# Patient Record
Sex: Male | Born: 1950
Health system: Southern US, Community
[De-identification: ages and names within clinical notes are randomized; demographics above are authoritative.]

## PROBLEM LIST (undated history)

## (undated) DIAGNOSIS — E785 Hyperlipidemia, unspecified: Secondary | ICD-10-CM

## (undated) DIAGNOSIS — M2550 Pain in unspecified joint: Secondary | ICD-10-CM

## (undated) DIAGNOSIS — M1990 Unspecified osteoarthritis, unspecified site: Secondary | ICD-10-CM

## (undated) DIAGNOSIS — J40 Bronchitis, not specified as acute or chronic: Secondary | ICD-10-CM

## (undated) DIAGNOSIS — M549 Dorsalgia, unspecified: Secondary | ICD-10-CM

## (undated) DIAGNOSIS — Z860100 Personal history of colon polyps, unspecified: Secondary | ICD-10-CM

## (undated) DIAGNOSIS — G8929 Other chronic pain: Secondary | ICD-10-CM

## (undated) DIAGNOSIS — G4730 Sleep apnea, unspecified: Secondary | ICD-10-CM

## (undated) DIAGNOSIS — I10 Essential (primary) hypertension: Secondary | ICD-10-CM

## (undated) DIAGNOSIS — N189 Chronic kidney disease, unspecified: Secondary | ICD-10-CM

## (undated) HISTORY — PX: OTHER SURGICAL HISTORY: SHX169

## (undated) HISTORY — PX: TRANSURETHRAL RESECTION OF PROSTATE: SHX73

## (undated) HISTORY — PX: SHOULDER ARTHROSCOPY: SHX128

## (undated) HISTORY — PX: TONSILLECTOMY: SUR1361

## (undated) SURGERY — Surgical Case
Anesthesia: *Unknown

---

## 2010-09-27 ENCOUNTER — Other Ambulatory Visit: Payer: Self-pay | Admitting: Internal Medicine

## 2010-09-27 ENCOUNTER — Ambulatory Visit
Admission: RE | Admit: 2010-09-27 | Discharge: 2010-09-27 | Disposition: A | Discharge status: Home / Self Care | Payer: Managed Care, Other (non HMO) | Source: Ambulatory Visit | Attending: Internal Medicine | Admitting: Internal Medicine

## 2010-09-27 DIAGNOSIS — M79669 Pain in unspecified lower leg: Secondary | ICD-10-CM

## 2011-02-25 ENCOUNTER — Encounter (HOSPITAL_BASED_OUTPATIENT_CLINIC_OR_DEPARTMENT_OTHER)
Admission: RE | Admit: 2011-02-25 | Discharge: 2011-02-25 | Disposition: A | Discharge status: Home / Self Care | Payer: Managed Care, Other (non HMO) | Source: Ambulatory Visit | Attending: Orthopedic Surgery | Admitting: Orthopedic Surgery

## 2011-02-25 ENCOUNTER — Other Ambulatory Visit: Payer: Self-pay

## 2011-02-25 ENCOUNTER — Encounter (HOSPITAL_BASED_OUTPATIENT_CLINIC_OR_DEPARTMENT_OTHER): Payer: Self-pay | Admitting: *Deleted

## 2011-02-25 LAB — BASIC METABOLIC PANEL
CO2: 33 mEq/L — ABNORMAL HIGH (ref 19–32)
Calcium: 9.9 mg/dL (ref 8.4–10.5)
Chloride: 102 mEq/L (ref 96–112)
Creatinine, Ser: 0.84 mg/dL (ref 0.50–1.35)
GFR calc Af Amer: >90 mL/min (ref >90)
Sodium: 143 mEq/L (ref 135–145)

## 2011-02-25 NOTE — Progress Notes (Signed)
Bring all meds-arrive with daughter

## 2011-02-26 ENCOUNTER — Encounter (HOSPITAL_BASED_OUTPATIENT_CLINIC_OR_DEPARTMENT_OTHER): Payer: Self-pay | Admitting: Certified Registered Nurse Anesthetist

## 2011-02-26 ENCOUNTER — Encounter (HOSPITAL_BASED_OUTPATIENT_CLINIC_OR_DEPARTMENT_OTHER): Payer: Self-pay | Admitting: *Deleted

## 2011-02-26 ENCOUNTER — Ambulatory Visit (HOSPITAL_BASED_OUTPATIENT_CLINIC_OR_DEPARTMENT_OTHER)
Admission: RE | Admit: 2011-02-26 | Discharge: 2011-02-26 | Disposition: A | Discharge status: Home / Self Care | Payer: Managed Care, Other (non HMO) | Source: Ambulatory Visit | Attending: Orthopedic Surgery | Admitting: Orthopedic Surgery

## 2011-02-26 ENCOUNTER — Encounter (HOSPITAL_BASED_OUTPATIENT_CLINIC_OR_DEPARTMENT_OTHER)
Admission: RE | Disposition: A | Discharge status: Home / Self Care | Payer: Self-pay | Source: Ambulatory Visit | Attending: Orthopedic Surgery

## 2011-02-26 ENCOUNTER — Ambulatory Visit (HOSPITAL_COMMUNITY): Payer: Managed Care, Other (non HMO)

## 2011-02-26 DIAGNOSIS — Z5309 Procedure and treatment not carried out because of other contraindication: Secondary | ICD-10-CM | POA: Insufficient documentation

## 2011-02-26 DIAGNOSIS — M25819 Other specified joint disorders, unspecified shoulder: Secondary | ICD-10-CM | POA: Insufficient documentation

## 2011-02-26 DIAGNOSIS — Z01818 Encounter for other preprocedural examination: Secondary | ICD-10-CM | POA: Insufficient documentation

## 2011-02-26 DIAGNOSIS — I10 Essential (primary) hypertension: Secondary | ICD-10-CM | POA: Insufficient documentation

## 2011-02-26 DIAGNOSIS — Z01810 Encounter for preprocedural cardiovascular examination: Secondary | ICD-10-CM | POA: Insufficient documentation

## 2011-02-26 DIAGNOSIS — Z01812 Encounter for preprocedural laboratory examination: Secondary | ICD-10-CM | POA: Insufficient documentation

## 2011-02-26 DIAGNOSIS — R7981 Abnormal blood-gas level: Secondary | ICD-10-CM | POA: Insufficient documentation

## 2011-02-26 HISTORY — DX: Unspecified osteoarthritis, unspecified site: M19.90

## 2011-02-26 HISTORY — DX: Essential (primary) hypertension: I10

## 2011-02-26 LAB — GLUCOSE, CAPILLARY: Glucose-Capillary: 168 mg/dL — ABNORMAL HIGH (ref 70–99)

## 2011-02-26 LAB — POCT HEMOGLOBIN-HEMACUE: Hemoglobin: 14.5 g/dL (ref 13.0–17.0)

## 2011-02-26 SURGERY — SHOULDER ARTHROSCOPY WITH SUBACROMIAL DECOMPRESSION
Anesthesia: General | Laterality: Right | Wound class: Clean

## 2011-02-26 MED ORDER — LACTATED RINGERS IV SOLN
INTRAVENOUS | Status: DC
  Administered 2011-02-26: 08:00:00 via INTRAVENOUS

## 2011-02-26 NOTE — Progress Notes (Signed)
Pt. Cancelled by Dr Ivin Booty and Dr. Luiz Blare due to recent bronchitis. Room air sat 90 , 91.  Pt will follow up with primary care M.D.  Dr. Luiz Blare spoke with pt. And will reschedule when this issue resolved.  Iv d/c tip intact .Pt. D/c with daughter to home

## 2011-02-26 NOTE — Anesthesia Preprocedure Evaluation (Addendum)
Anesthesia Evaluation    Airway       Dental   Pulmonary shortness of breath and at rest, sleep apnea (Never tested but is a very likely candidate.) ,  Pt. Presented today with Sa02 of 90% on RA. Not SOB. CXR showed no acute process but only with concerted effort was he able to increase to 95%.  I felt that he was not safe to block or have an anesthetic and then try to recover at [facility] without possible intervention to support his breathing, Dr. Luiz Blare and I agreed he should not have surgery today in this setting.  This may be his best although he remains 1 month S/P bronchitis with 02 at home over that weekend. Dr. Valentina Lucks at [facility] is aware per Dr. Luiz Blare.         Cardiovascular hypertension,     Neuro/Psych    GI/Hepatic   Endo/Other  Diabetes mellitus-  Renal/GU      Musculoskeletal   Abdominal   Peds  Hematology   Anesthesia Other Findings   Reproductive/Obstetrics                          Anesthesia Physical Anesthesia Plan Anesthesia Quick Evaluation

## 2011-02-26 NOTE — H&P (Signed)
  PREOPERATIVE H&P  Chief Complaint: 27 yomale w/ cont shoulder pain  HPI: Joseph Clay is a 60 y.o. male who presents for evaluation of R.. Shoulder pain. It has been present for several months and has been worsening. He has failed conservative measures. Pain is rated as moderate.  Past Medical History  Diagnosis Date  . Hypertension   . Arthritis   . Diabetes mellitus    Past Surgical History  Procedure Date  . Tonsillectomy    History   Social History  . Marital Status: Single    Spouse Name: N/A    Number of Children: N/A  . Years of Education: N/A   Social History Main Topics  . Smoking status: Former Games developer  . Smokeless tobacco: None  . Alcohol Use: Yes  . Drug Use: No  . Sexually Active:    Other Topics Concern  . None   Social History Narrative  . None   History reviewed. No pertinent family history. No Known Allergies Prior to Admission medications   Medication Sig Start Date End Date Taking? Authorizing Provider  amLODipine (NORVASC) 5 MG tablet Take 5 mg by mouth daily.     Yes Historical Provider, MD  aspirin 81 MG chewable tablet Chew 81 mg by mouth daily.     Yes Historical Provider, MD  calcium carbonate (OS-CAL) 600 MG TABS Take 600 mg by mouth 2 (two) times daily with a meal.     Yes Historical Provider, MD  lisinopril (PRINIVIL,ZESTRIL) 20 MG tablet Take 20 mg by mouth daily.     Yes Historical Provider, MD  metFORMIN (GLUCOPHAGE) 500 MG tablet Take 500 mg by mouth 2 (two) times daily with a meal.     Yes Historical Provider, MD  pravastatin (PRAVACHOL) 40 MG tablet Take 40 mg by mouth every evening.     Yes Historical Provider, MD     Positive ROS: none as relates to hpi  All other systems have been reviewed and were otherwise negative with the exception of those mentioned in the HPI and as above.  Physical Exam: Filed Vitals:   02/26/11 0708  BP: 177/91  Pulse: 98  Temp: 97.7 F (36.5 C)  Resp: 20    General: Alert, no acute  distress Cardiovascular: No pedal edema Respiratory: No cyanosis, no use of accessory musculature GI: No organomegaly, abdomen is soft and non-tender Skin: No lesions in the area of chief complaint Neurologic: Sensation intact distally Psychiatric: Patient is competent for consent with normal mood and affect Lymphatic: No axillary or cervical lymphadenopathy  MUSCULOSKELETAL: r. Shoulder +impingement/+ac joint pain to palp/ weakness in ER  Assessment/Plan: impingement Plan for Procedure(s): SHOULDER ARTHROSCOPY WITH SUBACROMIAL DECOMPRESSION  The risks benefits and alternatives were discussed with the patient including but not limited to the risks of nonoperative treatment, versus surgical intervention including infection, bleeding, nerve injury, malunion, nonunion, hardware prominence, hardware failure, need for hardware removal, blood clots, cardiopulmonary complications, morbidity, mortality, among others, and they were willing to proceed.  Predicted outcome is good, although there will be at least a six to nine month expected recovery.  Jasher Barkan L 02/26/2011 8:15 AM

## 2011-02-27 ENCOUNTER — Ambulatory Visit (HOSPITAL_COMMUNITY)
Admission: RE | Admit: 2011-02-27 | Discharge: 2011-02-27 | Disposition: A | Discharge status: Home / Self Care | Payer: Managed Care, Other (non HMO) | Source: Ambulatory Visit | Attending: Internal Medicine | Admitting: Internal Medicine

## 2011-02-27 ENCOUNTER — Other Ambulatory Visit (HOSPITAL_COMMUNITY): Payer: Self-pay | Admitting: Respiratory Therapy

## 2011-02-27 ENCOUNTER — Other Ambulatory Visit (HOSPITAL_COMMUNITY): Payer: Self-pay | Admitting: Internal Medicine

## 2011-02-27 DIAGNOSIS — R5381 Other malaise: Secondary | ICD-10-CM | POA: Insufficient documentation

## 2011-02-27 DIAGNOSIS — R5383 Other fatigue: Secondary | ICD-10-CM | POA: Insufficient documentation

## 2011-02-27 DIAGNOSIS — R0602 Shortness of breath: Secondary | ICD-10-CM

## 2011-02-27 LAB — BLOOD GAS, ARTERIAL
Acid-Base Excess: 6.1 mmol/L — ABNORMAL HIGH (ref 0.0–2.0)
Drawn by: 24610
Patient temperature: 98.6
TCO2: 32.2 mmol/L (ref 0–100)

## 2011-02-27 MED ORDER — ALBUTEROL SULFATE (5 MG/ML) 0.5% IN NEBU: 2.5000 mg | INHALATION_SOLUTION | Freq: Once | RESPIRATORY_TRACT | Status: DC

## 2011-02-27 MED ORDER — ALBUTEROL SULFATE (5 MG/ML) 0.5% IN NEBU
2.5000 mg | INHALATION_SOLUTION | Freq: Once | RESPIRATORY_TRACT | Status: AC
  Administered 2011-02-27: 2.5 mg via RESPIRATORY_TRACT

## 2011-03-05 ENCOUNTER — Encounter (HOSPITAL_COMMUNITY): Payer: Managed Care, Other (non HMO)

## 2011-05-09 ENCOUNTER — Encounter (HOSPITAL_COMMUNITY): Payer: Self-pay | Admitting: Pharmacy Technician

## 2011-05-15 ENCOUNTER — Other Ambulatory Visit: Payer: Self-pay | Admitting: Orthopedic Surgery

## 2011-05-15 ENCOUNTER — Other Ambulatory Visit (HOSPITAL_COMMUNITY): Payer: Self-pay | Admitting: Radiology

## 2011-05-15 ENCOUNTER — Ambulatory Visit (HOSPITAL_COMMUNITY)
Admission: RE | Admit: 2011-05-15 | Discharge: 2011-05-15 | Disposition: A | Discharge status: Home / Self Care | Payer: Managed Care, Other (non HMO) | Source: Ambulatory Visit | Attending: Internal Medicine | Admitting: Internal Medicine

## 2011-05-15 DIAGNOSIS — J4489 Other specified chronic obstructive pulmonary disease: Secondary | ICD-10-CM | POA: Insufficient documentation

## 2011-05-15 DIAGNOSIS — J449 Chronic obstructive pulmonary disease, unspecified: Secondary | ICD-10-CM | POA: Insufficient documentation

## 2011-05-15 LAB — BLOOD GAS, ARTERIAL
Acid-Base Excess: 4.2 mmol/L — ABNORMAL HIGH (ref 0.0–2.0)
Bicarbonate: 28.3 mEq/L — ABNORMAL HIGH (ref 20.0–24.0)
O2 Saturation: 94 %
Patient temperature: 98.6
TCO2: 29.6 mmol/L (ref 0–100)

## 2011-05-16 ENCOUNTER — Encounter (HOSPITAL_COMMUNITY): Payer: Self-pay

## 2011-05-16 ENCOUNTER — Encounter (HOSPITAL_COMMUNITY)
Admission: RE | Admit: 2011-05-16 | Discharge: 2011-05-16 | Disposition: A | Discharge status: Home / Self Care | Payer: Managed Care, Other (non HMO) | Source: Ambulatory Visit | Attending: Anesthesiology | Admitting: Anesthesiology

## 2011-05-16 ENCOUNTER — Encounter (HOSPITAL_COMMUNITY)
Admission: RE | Admit: 2011-05-16 | Discharge: 2011-05-16 | Disposition: A | Discharge status: Home / Self Care | Payer: Managed Care, Other (non HMO) | Source: Ambulatory Visit | Attending: Orthopedic Surgery | Admitting: Orthopedic Surgery

## 2011-05-16 HISTORY — DX: Personal history of colonic polyps: Z86.010

## 2011-05-16 HISTORY — DX: Personal history of colon polyps, unspecified: Z86.0100

## 2011-05-16 HISTORY — DX: Other chronic pain: G89.29

## 2011-05-16 HISTORY — DX: Dorsalgia, unspecified: M54.9

## 2011-05-16 HISTORY — DX: Bronchitis, not specified as acute or chronic: J40

## 2011-05-16 HISTORY — DX: Hyperlipidemia, unspecified: E78.5

## 2011-05-16 HISTORY — DX: Sleep apnea, unspecified: G47.30

## 2011-05-16 HISTORY — DX: Pain in unspecified joint: M25.50

## 2011-05-16 LAB — CBC
HCT: 43.9 % (ref 39.0–52.0)
Hemoglobin: 14.8 g/dL (ref 13.0–17.0)
MCV: 81.4 fL (ref 78.0–100.0)
RBC: 5.39 MIL/uL (ref 4.22–5.81)
RDW: 14.6 % (ref 11.5–15.5)
WBC: 7.3 10*3/uL (ref 4.0–10.5)

## 2011-05-16 LAB — TYPE AND SCREEN: Antibody Screen: NEGATIVE

## 2011-05-16 LAB — SURGICAL PCR SCREEN
MRSA, PCR: NEGATIVE
Staphylococcus aureus: NEGATIVE

## 2011-05-16 LAB — ABO/RH: ABO/RH(D): B POS

## 2011-05-16 NOTE — Progress Notes (Signed)
Pt has never had echo/stress test/heart cath

## 2011-05-16 NOTE — Pre-Procedure Instructions (Signed)
18 Joseph Clay  05/16/2011   Your procedure is scheduled on:  Fri, Feb 8 @ 11:45  Report to [facility] at 9:45 AM.  Call this number if you have problems the morning of surgery: [phone]   Remember:   Do not eat food:After Midnight.  May have clear liquids: up to 4 Hours before arrival.  Clear liquids include soda, tea, black coffee, apple or grape juice, broth.  Take these medicines the morning of surgery with A SIP OF WATER: Amlodipine   Do not wear jewelry, make-up or nail polish.  Do not wear lotions, powders, or perfumes. You may wear deodorant.  Do not shave 48 hours prior to surgery.  Do not bring valuables to the hospital.  Contacts, dentures or bridgework may not be worn into surgery.  Leave suitcase in the car. After surgery it may be brought to your room.  For patients admitted to the hospital, checkout time is 11:00 AM the day of discharge.   Patients discharged the day of surgery will not be allowed to drive home.  Name and phone number of your driver:   Special Instructions: CHG Shower Use Special Wash: 1/2 bottle night before surgery and 1/2 bottle morning of surgery.   Please read over the following fact sheets that you were given: Pain Booklet, Coughing and Deep Breathing, Blood Transfusion Information, MRSA Information and Surgical Site Infection Prevention

## 2011-05-16 NOTE — Progress Notes (Signed)
Requested labs that were done 05/15/11 from Dr.Griffin-a bmet,lipid panel,a1c,and u/a was done

## 2011-05-16 NOTE — Progress Notes (Signed)
Sleep study requested from Dr.Griffin

## 2011-05-16 NOTE — Progress Notes (Signed)
Pt doesn't have a cardiologist;medical MD manages HTN/hyperlipidemia(Dr.John Griffin-[facility])

## 2011-05-23 ENCOUNTER — Encounter (HOSPITAL_COMMUNITY): Payer: Self-pay | Admitting: Anesthesiology

## 2011-05-23 ENCOUNTER — Encounter (HOSPITAL_COMMUNITY)
Admission: RE | Disposition: A | Discharge status: Home / Self Care | Payer: Self-pay | Source: Ambulatory Visit | Attending: Orthopedic Surgery

## 2011-05-23 ENCOUNTER — Ambulatory Visit (HOSPITAL_COMMUNITY): Payer: Managed Care, Other (non HMO) | Admitting: Anesthesiology

## 2011-05-23 ENCOUNTER — Ambulatory Visit (HOSPITAL_COMMUNITY)
Admission: RE | Admit: 2011-05-23 | Discharge: 2011-05-24 | Disposition: A | Discharge status: Home / Self Care | Payer: Managed Care, Other (non HMO) | Source: Ambulatory Visit | Attending: Orthopedic Surgery | Admitting: Orthopedic Surgery

## 2011-05-23 ENCOUNTER — Encounter (HOSPITAL_COMMUNITY): Payer: Self-pay | Admitting: *Deleted

## 2011-05-23 DIAGNOSIS — M719 Bursopathy, unspecified: Secondary | ICD-10-CM | POA: Insufficient documentation

## 2011-05-23 DIAGNOSIS — G4730 Sleep apnea, unspecified: Secondary | ICD-10-CM | POA: Insufficient documentation

## 2011-05-23 DIAGNOSIS — M67919 Unspecified disorder of synovium and tendon, unspecified shoulder: Secondary | ICD-10-CM | POA: Insufficient documentation

## 2011-05-23 DIAGNOSIS — Z87891 Personal history of nicotine dependence: Secondary | ICD-10-CM | POA: Insufficient documentation

## 2011-05-23 DIAGNOSIS — I10 Essential (primary) hypertension: Secondary | ICD-10-CM | POA: Insufficient documentation

## 2011-05-23 DIAGNOSIS — M24119 Other articular cartilage disorders, unspecified shoulder: Secondary | ICD-10-CM | POA: Insufficient documentation

## 2011-05-23 DIAGNOSIS — Z01812 Encounter for preprocedural laboratory examination: Secondary | ICD-10-CM | POA: Insufficient documentation

## 2011-05-23 DIAGNOSIS — M7541 Impingement syndrome of right shoulder: Secondary | ICD-10-CM

## 2011-05-23 DIAGNOSIS — M19019 Primary osteoarthritis, unspecified shoulder: Secondary | ICD-10-CM | POA: Insufficient documentation

## 2011-05-23 DIAGNOSIS — E119 Type 2 diabetes mellitus without complications: Secondary | ICD-10-CM | POA: Insufficient documentation

## 2011-05-23 DIAGNOSIS — M25819 Other specified joint disorders, unspecified shoulder: Secondary | ICD-10-CM | POA: Insufficient documentation

## 2011-05-23 LAB — GLUCOSE, CAPILLARY
Glucose-Capillary: 134 mg/dL — ABNORMAL HIGH (ref 70–99)
Glucose-Capillary: 134 mg/dL — ABNORMAL HIGH (ref 70–99)

## 2011-05-23 SURGERY — ARTHROSCOPY, SHOULDER, WITH ROTATOR CUFF REPAIR
Anesthesia: Regional | Site: Shoulder | Laterality: Right | Wound class: Clean

## 2011-05-23 MED ORDER — OXYCODONE-ACETAMINOPHEN 5-325 MG PO TABS: 1.0000 | ORAL_TABLET | Freq: Four times a day (QID) | ORAL | Status: DC | PRN

## 2011-05-23 MED ORDER — HYDROMORPHONE HCL PF 1 MG/ML IJ SOLN: 0.5000 mg | INTRAMUSCULAR | Status: DC | PRN

## 2011-05-23 MED ORDER — PROPOFOL 10 MG/ML IV EMUL
INTRAVENOUS | Status: DC | PRN
  Administered 2011-05-23: 20 mg via INTRAVENOUS
  Administered 2011-05-23: 180 mg via INTRAVENOUS

## 2011-05-23 MED ORDER — ROCURONIUM BROMIDE 100 MG/10ML IV SOLN
INTRAVENOUS | Status: DC | PRN
  Administered 2011-05-23: 50 mg via INTRAVENOUS

## 2011-05-23 MED ORDER — POVIDONE-IODINE 7.5 % EX SOLN: Freq: Once | CUTANEOUS | Status: DC

## 2011-05-23 MED ORDER — NEOSTIGMINE METHYLSULFATE 1 MG/ML IJ SOLN
INTRAMUSCULAR | Status: DC | PRN
  Administered 2011-05-23: 5 mg via INTRAVENOUS

## 2011-05-23 MED ORDER — ONDANSETRON HCL 4 MG/2ML IJ SOLN
INTRAMUSCULAR | Status: DC | PRN
  Administered 2011-05-23: 4 mg via INTRAVENOUS

## 2011-05-23 MED ORDER — ONDANSETRON HCL 4 MG PO TABS: 4.0000 mg | ORAL_TABLET | Freq: Four times a day (QID) | ORAL | Status: DC | PRN

## 2011-05-23 MED ORDER — LACTATED RINGERS IV SOLN
INTRAVENOUS | Status: DC
  Administered 2011-05-23: 11:00:00 via INTRAVENOUS

## 2011-05-23 MED ORDER — LISINOPRIL 20 MG PO TABS
20.0000 mg | ORAL_TABLET | Freq: Every day | ORAL | Status: DC
Start: 2011-05-23 — End: 2011-05-24
  Administered 2011-05-24: 20 mg via ORAL
  Filled 2011-05-23 (×2): qty 1

## 2011-05-23 MED ORDER — SODIUM CHLORIDE 0.9 % IR SOLN
Status: DC | PRN
  Administered 2011-05-23: 3000 mL

## 2011-05-23 MED ORDER — SODIUM CHLORIDE 0.9 % IV SOLN
10.0000 mg | INTRAVENOUS | Status: DC | PRN
  Administered 2011-05-23: 50 ug/min via INTRAVENOUS

## 2011-05-23 MED ORDER — MIDAZOLAM HCL 2 MG/2ML IJ SOLN: 1.0000 mg | Freq: Once | INTRAMUSCULAR | Status: DC

## 2011-05-23 MED ORDER — ONDANSETRON HCL 4 MG/2ML IJ SOLN: 4.0000 mg | Freq: Four times a day (QID) | INTRAMUSCULAR | Status: DC | PRN

## 2011-05-23 MED ORDER — HYDROMORPHONE HCL PF 1 MG/ML IJ SOLN: 0.2500 mg | INTRAMUSCULAR | Status: DC | PRN

## 2011-05-23 MED ORDER — FENTANYL CITRATE 0.05 MG/ML IJ SOLN: 50.0000 ug | INTRAMUSCULAR | Status: DC | PRN

## 2011-05-23 MED ORDER — LACTATED RINGERS IV SOLN
INTRAVENOUS | Status: DC | PRN
  Administered 2011-05-23 (×2): via INTRAVENOUS

## 2011-05-23 MED ORDER — METFORMIN HCL 500 MG PO TABS
500.0000 mg | ORAL_TABLET | Freq: Two times a day (BID) | ORAL | Status: DC
  Administered 2011-05-24: 500 mg via ORAL
  Filled 2011-05-23 (×4): qty 1

## 2011-05-23 MED ORDER — BUPIVACAINE-EPINEPHRINE PF 0.5-1:200000 % IJ SOLN
INTRAMUSCULAR | Status: DC | PRN
  Administered 2011-05-23: 30 mL

## 2011-05-23 MED ORDER — GLYCOPYRROLATE 0.2 MG/ML IJ SOLN
INTRAMUSCULAR | Status: DC | PRN
  Administered 2011-05-23: .8 mg via INTRAVENOUS

## 2011-05-23 MED ORDER — FENTANYL CITRATE 0.05 MG/ML IJ SOLN
INTRAMUSCULAR | Status: DC | PRN
  Administered 2011-05-23 (×3): 50 ug via INTRAVENOUS

## 2011-05-23 MED ORDER — OXYCODONE-ACETAMINOPHEN 5-325 MG PO TABS
1.0000 | ORAL_TABLET | ORAL | Status: DC | PRN
  Administered 2011-05-24: 1 via ORAL
  Filled 2011-05-23: qty 1

## 2011-05-23 MED ORDER — AMLODIPINE BESYLATE 5 MG PO TABS
5.0000 mg | ORAL_TABLET | Freq: Every day | ORAL | Status: DC
  Administered 2011-05-24: 5 mg via ORAL
  Filled 2011-05-23: qty 1

## 2011-05-23 MED ORDER — CEFAZOLIN SODIUM 1-5 GM-% IV SOLN
INTRAVENOUS | Status: DC | PRN
  Administered 2011-05-23 (×2): 1 g via INTRAVENOUS

## 2011-05-23 MED ORDER — MIDAZOLAM HCL 5 MG/5ML IJ SOLN
INTRAMUSCULAR | Status: DC | PRN
  Administered 2011-05-23 (×2): 1 mg via INTRAVENOUS

## 2011-05-23 SURGICAL SUPPLY — 68 items
BENZOIN TINCTURE PRP APPL 2/3 (GAUZE/BANDAGES/DRESSINGS) IMPLANT
BLADE CUDA 4.2 (BLADE) ×2 IMPLANT
BLADE SURG 15 STRL LF DISP TIS (BLADE) IMPLANT
BLADE SURG 15 STRL SS (BLADE)
BUR VERTEX HOODED 4.5 (BURR) ×2 IMPLANT
CANISTER OMNI JUG 16 LITER (MISCELLANEOUS) IMPLANT
CANISTER SUCTION 2500CC (MISCELLANEOUS) IMPLANT
CANNULA 5.75X71 LONG (CANNULA) IMPLANT
CANNULA TWIST IN 8.25X7CM (CANNULA) ×2 IMPLANT
CLOTH BEACON ORANGE TIMEOUT ST (SAFETY) ×2 IMPLANT
DECANTER SPIKE VIAL GLASS SM (MISCELLANEOUS) IMPLANT
DRAPE INCISE IOBAN 66X45 STRL (DRAPES) ×2 IMPLANT
DRAPE STERI 35X30 U-POUCH (DRAPES) ×2 IMPLANT
DRAPE SURG 17X23 STRL (DRAPES) ×2 IMPLANT
DRAPE U-SHAPE 47X51 STRL (DRAPES) ×2 IMPLANT
DRAPE U-SHAPE 76X120 STRL (DRAPES) ×4 IMPLANT
DRSG EMULSION OIL 3X3 NADH (GAUZE/BANDAGES/DRESSINGS) ×2 IMPLANT
DRSG PAD ABDOMINAL 8X10 ST (GAUZE/BANDAGES/DRESSINGS) ×4 IMPLANT
DURAPREP 26ML APPLICATOR (WOUND CARE) ×2 IMPLANT
ELECT REM PT RETURN 9FT ADLT (ELECTROSURGICAL) ×2
ELECTRODE REM PT RTRN 9FT ADLT (ELECTROSURGICAL) ×1 IMPLANT
GLOVE BIOGEL PI IND STRL 8 (GLOVE) ×2 IMPLANT
GLOVE BIOGEL PI INDICATOR 8 (GLOVE) ×2
GLOVE ECLIPSE 7.5 STRL STRAW (GLOVE) ×4 IMPLANT
GOWN BRE IMP PREV XXLGXLNG (GOWN DISPOSABLE) ×2 IMPLANT
GOWN PREVENTION PLUS XLARGE (GOWN DISPOSABLE) ×2 IMPLANT
GOWN PREVENTION PLUS XXLARGE (GOWN DISPOSABLE) ×2 IMPLANT
KIT BASIN OR (CUSTOM PROCEDURE TRAY) ×2 IMPLANT
NDL SUT 6 .5 CRC .975X.05 MAYO (NEEDLE) IMPLANT
NEEDLE 1/2 CIR CATGUT .05X1.09 (NEEDLE) IMPLANT
NEEDLE HYPO 18GX1.5 BLUNT FILL (NEEDLE) ×2 IMPLANT
NEEDLE MAYO TAPER (NEEDLE)
NEEDLE SCORPION MULTI FIRE (NEEDLE) IMPLANT
NS IRRIG 1000ML POUR BTL (IV SOLUTION) IMPLANT
PACK ARTHROSCOPY DSU (CUSTOM PROCEDURE TRAY) ×2 IMPLANT
PASSER SUT SWANSON 36MM LOOP (INSTRUMENTS) IMPLANT
PENCIL BUTTON HOLSTER BLD 10FT (ELECTRODE) IMPLANT
SET IRRIG Y TYPE TUR BLADDER L (SET/KITS/TRAYS/PACK) ×2 IMPLANT
SLING ARM FOAM STRAP LRG (SOFTGOODS) IMPLANT
SLING ARM FOAM STRAP MED (SOFTGOODS) IMPLANT
SLING ARM FOAM STRAP XLG (SOFTGOODS) ×2 IMPLANT
SLING ARM IMMOBILIZER LRG (SOFTGOODS) IMPLANT
SPONGE GAUZE 4X4 12PLY (GAUZE/BANDAGES/DRESSINGS) ×2 IMPLANT
SPONGE LAP 4X18 X RAY DECT (DISPOSABLE) IMPLANT
STRIP CLOSURE SKIN 1/2X4 (GAUZE/BANDAGES/DRESSINGS) IMPLANT
SUCTION FRAZIER TIP 10 FR DISP (SUCTIONS) IMPLANT
SUT ETHIBOND 2 OS 4 DA (SUTURE) IMPLANT
SUT ETHILON 4 0 PS 2 18 (SUTURE) IMPLANT
SUT MNCRL AB 3-0 PS2 18 (SUTURE) IMPLANT
SUT PDS AB 0 CT 36 (SUTURE) IMPLANT
SUT PROLENE 3 0 PS 2 (SUTURE) IMPLANT
SUT TICRON 1 T 12 (SUTURE) IMPLANT
SUT TIGER TAPE 7 IN WHITE (SUTURE) IMPLANT
SUT VIC AB 0 CT1 27 (SUTURE)
SUT VIC AB 0 CT1 27XBRD ANBCTR (SUTURE) IMPLANT
SUT VIC AB 1 CT1 27 (SUTURE)
SUT VIC AB 1 CT1 27XBRD ANBCTR (SUTURE) IMPLANT
SUT VIC AB 2-0 SH 27 (SUTURE)
SUT VIC AB 2-0 SH 27XBRD (SUTURE) IMPLANT
SYR 5ML LL (SYRINGE) ×2 IMPLANT
TAPE CLOTH SURG 6X10 WHT LF (GAUZE/BANDAGES/DRESSINGS) ×2 IMPLANT
TAPE FIBER 2MM 7IN #2 BLUE (SUTURE) IMPLANT
TOWEL OR 17X24 6PK STRL BLUE (TOWEL DISPOSABLE) ×2 IMPLANT
TOWEL OR NON WOVEN STRL DISP B (DISPOSABLE) ×2 IMPLANT
TUBE CONNECTING 20X1/4 (TUBING) IMPLANT
WAND 90 DEG TURBOVAC W/CORD (SURGICAL WAND) ×2 IMPLANT
WATER STERILE IRR 1000ML POUR (IV SOLUTION) ×2 IMPLANT
YANKAUER SUCT BULB TIP NO VENT (SUCTIONS) IMPLANT

## 2011-05-23 NOTE — Anesthesia Preprocedure Evaluation (Addendum)
Anesthesia Evaluation  Patient identified by MRN, date of birth, ID band Patient awake    Reviewed: Allergy & Precautions, H&P , NPO status , Patient's Chart, lab work & pertinent test results, reviewed documented beta blocker date and time   Airway Mallampati: II TM Distance: >3 FB Neck ROM: full    Dental  (+) Dental Advisory Given, Teeth Intact and Caps   Pulmonary sleep apnea and Continuous Positive Airway Pressure Ventilation , former smoker         Cardiovascular hypertension, Pt. on medications     Neuro/Psych    GI/Hepatic   Endo/Other  Diabetes mellitus-, Well Controlled, Type 2, Oral Hypoglycemic AgentsMorbid obesity  Renal/GU      Musculoskeletal  (+) Arthritis -,   Abdominal   Peds  Hematology   Anesthesia Other Findings Caps front top  Reproductive/Obstetrics                         Anesthesia Physical Anesthesia Plan  ASA: III  Anesthesia Plan: General and Regional   Post-op Pain Management: MAC Combined w/ Regional for Post-op pain   Induction: Intravenous  Airway Management Planned: Oral ETT  Additional Equipment:   Intra-op Plan:   Post-operative Plan: Extubation in OR  Informed Consent: I have reviewed the patients History and Physical, chart, labs and discussed the procedure including the risks, benefits and alternatives for the proposed anesthesia with the patient or authorized representative who has indicated his/her understanding and acceptance.     Plan Discussed with: CRNA and Surgeon  Anesthesia Plan Comments:         Anesthesia Quick Evaluation

## 2011-05-23 NOTE — Anesthesia Postprocedure Evaluation (Signed)
Anesthesia Post Note  Patient: Joseph Clay  Procedure(s) Performed:  SHOULDER ARTHROSCOPY WITH ROTATOR CUFF REPAIR - RIGHT SHOULDER ARTHROSCOPY WITH SUBACROMIAL DECOMPRESSION AND DISTAL CLAVICLE EXCISION   Anesthesia type: General  Patient location: PACU  Post pain: Pain level controlled and Adequate analgesia  Post assessment: Post-op Vital signs reviewed, Patient's Cardiovascular Status Stable, Respiratory Function Stable, Patent Airway and Pain level controlled  Last Vitals:  Filed Vitals:   05/23/11 1615  BP:   Pulse:   Temp: 36.2 C  Resp:     Post vital signs: Reviewed and stable  Level of consciousness: awake, alert  and oriented  Complications: No apparent anesthesia complications

## 2011-05-23 NOTE — Anesthesia Procedure Notes (Addendum)
Anesthesia Regional Block:  Interscalene brachial plexus block  Pre-Anesthetic Checklist: ,, timeout performed, Correct Patient, Correct Site, Correct Laterality, Correct Procedure, Correct Position, site marked, Risks and benefits discussed,  Surgical consent,  Pre-op evaluation,  At surgeon's request and post-op pain management  Laterality: Right  Prep: chloraprep       Needles:  Injection technique: Single-shot  Needle Type: Echogenic Stimulator Needle     Needle Length: 5cm 5 cm Needle Gauge: 22 and 22 G    Additional Needles:  Procedures: ultrasound guided and nerve stimulator Interscalene brachial plexus block  Nerve Stimulator or Paresthesia:  Response: biceps flexion, 0.45 mA,   Additional Responses:   Narrative:  Start time: 05/23/2011 11:00 AM End time: 05/23/2011 11:13 AM Injection made incrementally with aspirations every 5 mL.  Performed by: Personally  Anesthesiologist: Dr Chaney Malling  Additional Notes: Functioning IV was confirmed and monitors were applied.  A 50mm 22ga Arrow echogenic stimulator needle was used. Sterile prep and drape,hand hygiene and sterile gloves were used.  Negative aspiration and negative test dose prior to incremental administration of local anesthetic. The patient tolerated the procedure well.  Ultrasound guidance: relevent anatomy identified, needle position confirmed, local anesthetic spread visualized around nerve(s), vascular puncture avoided.  Image printed for medical record.   Interscalene brachial plexus block Date/Time: 05/23/2011 1:22 PM Performed by: Darcey Nora    Procedure Name: Intubation Date/Time: 05/23/2011 12:14 PM Performed by: Darcey Nora Pre-anesthesia Checklist: Patient identified, Emergency Drugs available, Suction available and Patient being monitored Patient Re-evaluated:Patient Re-evaluated prior to inductionOxygen Delivery Method: Circle System Utilized Preoxygenation: Pre-oxygenation with 100%  oxygen Intubation Type: IV induction Ventilation: Oral airway inserted - appropriate to patient size and Mask ventilation without difficulty Laryngoscope Size: Mac, 3 and 4 Grade View: Grade III Tube type: Oral Tube size: 8.0 mm Number of attempts: 4 Airway Equipment and Method: stylet and video-laryngoscopy Placement Confirmation: ETT inserted through vocal cords under direct vision,  positive ETCO2 and breath sounds checked- equal and bilateral Secured at: 23 cm Tube secured with: Tape Dental Injury: Teeth and Oropharynx as per pre-operative assessment and Bloody posterior oropharynx  Difficulty Due To: Difficult Airway- due to reduced neck mobility Future Recommendations: Recommend- induction with short-acting agent, and alternative techniques readily available

## 2011-05-23 NOTE — H&P (Signed)
PREOPERATIVE H&P  Chief Complaint: r. Shoulder pain  HPI: Joseph Clay is a 61 y.o. male who presents for evaluation of r shoulder pain. It has been present for 6 mon and has been worsening. He has ultrasound documented rct.  He has failed conservative measures. Pain is rated as moderate.  Past Medical History  Diagnosis Date  . Arthritis   . Hyperlipidemia     takes Pravastatin daily  . Sleep apnea     CPAP-sleep study done 2months ago  . Bronchitis     couple of months ago  . Hypertension      takes Lisinopril and AMlodipine daily  . Joint pain     right shoulder and right knee  . Chronic back pain     bulding disc  . Hx of colonic polyps   . Diabetes mellitus     takes Metformin bid   Past Surgical History  Procedure Date  . Tonsillectomy     as a child  . Colonscopy    History   Social History  . Marital Status: Single    Spouse Name: N/A    Number of Children: N/A  . Years of Education: N/A   Social History Main Topics  . Smoking status: Former Games developer  . Smokeless tobacco: None   Comment: quit 20+yrs ago  . Alcohol Use: Yes     occasionally   . Drug Use: No  . Sexually Active: No   Other Topics Concern  . None   Social History Narrative  . None   Family History  Problem Relation Age of Onset  . Anesthesia problems Neg Hx   . Hypotension Neg Hx   . Malignant hyperthermia Neg Hx   . Pseudochol deficiency Neg Hx    No Known Allergies Prior to Admission medications   Medication Sig Start Date End Date Taking? Authorizing Provider  amLODipine (NORVASC) 5 MG tablet Take 5 mg by mouth daily.     Yes Historical Provider, MD  aspirin 81 MG chewable tablet Chew 81 mg by mouth daily.     Yes Historical Provider, MD  calcium carbonate (OS-CAL) 600 MG TABS Take 600 mg by mouth daily.    Yes Historical Provider, MD  lisinopril (PRINIVIL,ZESTRIL) 20 MG tablet Take 20 mg by mouth daily.     Yes Historical Provider, MD  metFORMIN (GLUCOPHAGE) 500 MG  tablet Take 500 mg by mouth 2 (two) times daily with a meal.     Yes Historical Provider, MD  pravastatin (PRAVACHOL) 40 MG tablet Take 40 mg by mouth every evening.     Yes Historical Provider, MD     Positive ROS: none  All other systems have been reviewed and were otherwise negative with the exception of those mentioned in the HPI and as above.  Physical Exam: Filed Vitals:   05/23/11 0935  BP: 155/93  Pulse: 89  Temp: 97.5 F (36.4 C)  Resp: 18    General: Alert, no acute distress Cardiovascular: No pedal edema Respiratory: No cyanosis, no use of accessory musculature GI: No organomegaly, abdomen is soft and non-tender Skin: No lesions in the area of chief complaint Neurologic: Sensation intact distally Psychiatric: Patient is competent for consent with normal mood and affect Lymphatic: No axillary or cervical lymphadenopathy  MUSCULOSKELETAL: impingement and ttp over AC jiont and pain on rom.  Mild weakness with ext. rotation  Assessment/Plan: IMPINGEMENT, AC JOINT PAIN Plan for Procedure(s): SHOULDER ARTHROSCOPY WITH ROTATOR CUFF REPAIR  The risks benefits  and alternatives were discussed with the patient including but not limited to the risks of nonoperative treatment, versus surgical intervention including infection, bleeding, nerve injury, malunion, nonunion, hardware prominence, hardware failure, need for hardware removal, blood clots, cardiopulmonary complications, morbidity, mortality, among others, and they were willing to proceed.  Predicted outcome is good, although there will be at least a six to nine month expected recovery.  Jonai Weyland L, MD 05/23/2011 11:37 AM

## 2011-05-23 NOTE — Transfer of Care (Signed)
Immediate Anesthesia Transfer of Care Note  Patient: Joseph Clay  Procedure(s) Performed:  SHOULDER ARTHROSCOPY WITH ROTATOR CUFF REPAIR - RIGHT SHOULDER ARTHROSCOPY WITH SUBACROMIAL DECOMPRESSION AND DISTAL CLAVICLE EXCISION   Patient Location: PACU  Anesthesia Type: General  Level of Consciousness: awake, alert  and patient cooperative  Airway & Oxygen Therapy: Patient Spontanous Breathing and Patient connected to face mask oxygen  Post-op Assessment: Report given to PACU RN, Post -op Vital signs reviewed and stable and Patient moving all extremities  Post vital signs: Reviewed and stable  Complications: No apparent anesthesia complications

## 2011-05-23 NOTE — Brief Op Note (Signed)
05/23/2011  2:46 PM  PATIENT:  [name]  61 y.o. male  PRE-OPERATIVE DIAGNOSIS:  IMPINGEMENT, AC JOINT ARTHRITIS RIGHT SHOULDER  POST-OPERATIVE DIAGNOSIS:  IMPINGEMENT, AC JOINT ARTHRITIS RIGHT SHOULDER  PROCEDURE:  Procedure(s):RIGHT SHOULDER ARTHROSCOPY WITH ACROMIOPLASTY AND DISTAL CLAVICLE RESECTION  SURGEON:  Surgeon(s): Harvie Junior, MD  PHYSICIAN ASSISTANT: Jacksyn Beeks PA_C    ANESTHESIA:   general  EBL:  Total I/O In: 1500 [I.V.:1500] Out: 50 [Blood:50]  BLOOD ADMINISTERED:none  DRAINS: none   LOCAL MEDICATIONS USED:  NONE  SPECIMEN:  No Specimen  DISPOSITION OF SPECIMEN:  N/A  COUNTS:  YES   DICTATION: .#[id number]  PLAN OF CARE: Discharge to home after PACU  PATIENT DISPOSITION:  PACU - hemodynamically stable.   Gus Puma PA-C

## 2011-05-23 NOTE — Preoperative (Signed)
Beta Blockers   Reason not to administer Beta Blockers:Not Applicable 

## 2011-05-24 NOTE — Progress Notes (Signed)
PATIENT ID: Joseph Clay  MRN: [id number]  DOB/AGE:  1950-10-04 / 61 y.o.  1 Day Post-Op Procedure(s) (LRB): SHOULDER ARTHROSCOPY WITH ROTATOR CUFF REPAIR (Right)  Subjective: Pain is mild.  No c/o chest pain or SOB.   Difficulty voiding, required I&O cath x 1.  Voided small amount over night.   Objective: Vital signs in last 24 hours: Temp:  [97.2 F (36.2 C)-99.3 F (37.4 C)] 99.3 F (37.4 C) (02/09 0250) Pulse Rate:  [77-105] 91  (02/09 0250) Resp:  [16-31] 18  (02/09 0250) BP: (138-179)/(73-104) 155/87 mmHg (02/09 0250) SpO2:  [89 %-100 %] 93 % (02/09 0250) Weight:  [138.574 kg (305 lb 8 oz)] 138.574 kg (305 lb 8 oz) (02/08 1640)  Intake/Output from previous day: 02/08 0701 - 02/09 0700 In: 1720 [P.O.:220; I.V.:1500] Out: 275 [Urine:225; Blood:50] Intake/Output this shift: Total I/O In: 120 [P.O.:120] Out: 125 [Urine:125]  No results found for this basename: HGB:5 in the last 72 hours No results found for this basename: WBC:2,RBC:2,HCT:2,PLT:2 in the last 72 hours No results found for this basename: NA:2,K:2,CL:2,CO2:2,BUN:2,CREATININE:2,GLUCOSE:2,CALCIUM:2 in the last 72 hours No results found for this basename: LABPT:2,INR:2 in the last 72 hours  Physical Exam: Neurovascular intact Sensation intact distally Intact pulses distally Incision: dressing C/D/I  Assessment/Plan: 1 Day Post-Op Procedure(s) (LRB): SHOULDER ARTHROSCOPY WITH ROTATOR CUFF REPAIR (Right)   Plan for discharge after voids, encouraged to get up and move around. Weight Bearing as Tolerated (WBAT) F/u dr. Luiz Blare 1wk   Mable Paris 05/24/2011, 5:31 AM

## 2011-05-24 NOTE — Op Note (Signed)
NAMEROBIN, PAFFORD NO.:  [id number]  MEDICAL RECORD NO.:  [id number]  LOCATION:  5013                         FACILITY:  [facility]  PHYSICIAN:  Harvie Junior, M.D.   DATE OF BIRTH:  [birth year]  DATE OF PROCEDURE:  05/23/2011 DATE OF DISCHARGE:                              OPERATIVE REPORT   PREOPERATIVE DIAGNOSIS:  Impingement, acromioclavicular joint arthritis, and rotator cuff tear.  POSTOPERATIVE DIAGNOSIS:  Impingement, acromioclavicular joint arthritis, and undersurface rotator cuff tearing with superior labral tearing, anterior to posterior.  PROCEDURES: 1. Arthroscopic subacromial decompression from the lateral and     posterior compartment. 2. Arthroscopic distal clavicle resection of an anterior compartment     over 20 mm. 3. Subtotal bursectomy. 4. Debridement of superior labral tear anterior to posterior within     the glenohumeral joint.  SURGEON:  Harvie Junior, MD  ASSISTANT:  Marshia Ly, PA  ANESTHESIA:  General.  BRIEF HISTORY:  Mr. Strege is a 61 year old male with a long history of having significant right shoulder pain.  He had been treated conservatively for a long period time.  Injection therapy had helped. Ultrasound imaging showed questionable rotator cuff tear, at least high- grade partial thickness.  He is brought to the operating room after failure of all conservative care for evaluation, debridement, and fixation as needed.  PROCEDURE IN DETAIL:  The patient was brought to the operating room. After adequate anesthesia was obtained with general anesthetic, the patient was placed supine on the operating table.  The right arm was prepped and draped in the usual sterile fashion.  Following this, routine arthroscopic examination of the shoulder revealed there was an obvious superior labral tear, anterior to posterior.  This was debrided with a suction shaver back to a smooth and stable rim.  Biceps tendon was evaluated  and found to be pushing at the rim but it seemed to be anchored well.  The rotator cuff was then evaluated on the surface and happily appeared to be intact.  There were some minimal degenerative changes in the cuff, but overall looked to be intact.  At this point, attention was turned out to the glenohumeral joint and the subacromial space.  An anterolateral acromioplasty was performed.  He had a dramatic anterior spur.  This was debrided, probably almost 2 cm from the very tip of the spur up into the acromion.  This gave a very nice space for the rotator cuff.  A subtotal bursectomy was performed.  At this time, no evidence of high-grade partial or full thickness rotator cuff tearing.  At this time, attention was turned to the distal clavicle, which was resected over 20 mm.  Once this was completed, the shoulder was copiously and thoroughly lavaged, and suction dried.  The arthroscopic portals were closed with bandage.  A sterile compressive dressing was applied.  The patient was taken to the recovery room and was noted to be in satisfactory condition.  Estimated blood loss for this procedure was none.     Harvie Junior, M.D.     Ranae Plumber  D:  05/23/2011  T:  05/24/2011  Job:  [id number]

## 2011-05-24 NOTE — Evaluation (Signed)
Physical Therapy Evaluation Patient Details Name: Joseph Clay MRN: [id number] DOB: 04/11/1951 Today's Date: 05/24/2011  Problem List: There is no problem list on file for this patient.   Past Medical History:  Past Medical History  Diagnosis Date  . Arthritis   . Hyperlipidemia     takes Pravastatin daily  . Sleep apnea     CPAP-sleep study done 2months ago  . Bronchitis     couple of months ago  . Hypertension      takes Lisinopril and AMlodipine daily  . Joint pain     right shoulder and right knee  . Chronic back pain     bulding disc  . Hx of colonic polyps   . Diabetes mellitus     takes Metformin bid   Past Surgical History:  Past Surgical History  Procedure Date  . Tonsillectomy     as a child  . Colonscopy     PT Assessment/Plan/Recommendation PT Assessment Clinical Impression Statement: Pt is a 61 y.o. male who underwent shoulder sx yesterday, 05-23-11.  His plan is to d/c home today.  No home equipment is needed.  Pt is I/mod I with all mobility.  Pt was educated on need for RUE sling when OOB.  No further PT needs. PT sign off. PT Recommendation/Assessment: Patent does not need any further PT services No Skilled PT: All education completed;Patient is modified independent with all activity/mobility PT Recommendation Follow Up Recommendations: No PT follow up Equipment Recommended: None recommended by PT PT Goals     PT Evaluation Precautions/Restrictions  Precautions Precautions: Shoulder Type of Shoulder Precautions: sling, gentle ROM Prior Functioning  Home Living Lives With: Alone Type of Home: Apartment Home Layout: Multi-level;Full bath on main level;Bed/bath upstairs Alternate Level Stairs-Rails: Right Home Access: Level entry Bathroom Toilet: Standard Home Adaptive Equipment: None Prior Function Level of Independence: Independent with basic ADLs;Independent with homemaking with ambulation;Independent with gait;Independent with  transfers Driving: Yes Cognition Cognition Arousal/Alertness: Awake/alert Overall Cognitive Status: Appears within functional limits for tasks assessed Orientation Level: Oriented X4 Sensation/Coordination   Extremity Assessment   Mobility (including Balance) Bed Mobility Bed Mobility: Yes Supine to Sit: 6: Modified independent (Device/Increase time) Sit to Supine: 6: Modified independent (Device/Increase time) Transfers Transfers: Yes Sit to Stand: 6: Modified independent (Device/Increase time) Stand to Sit: 6: Modified independent (Device/Increase time) Stand Pivot Transfers: 6: Modified independent (Device/Increase time) Ambulation/Gait Ambulation/Gait: Yes Ambulation/Gait Assistance: 7: Independent Ambulation Distance (Feet): 500 Feet Assistive device: None Gait Pattern: Within Functional Limits Gait velocity: WFL Stairs: Yes Stairs Assistance: 6: Modified independent (Device/Increase time) Stair Management Technique: One rail Right;Step to pattern Number of Stairs: 2  Height of Stairs: 6     Exercise    End of Session PT - End of Session Equipment Utilized During Treatment: Gait belt;Other (comment) (sling RUE) Activity Tolerance: Patient tolerated treatment well Patient left: in chair Nurse Communication: Mobility status for transfers;Mobility status for ambulation General Behavior During Session: [facility] for tasks performed Cognition: [facility] for tasks performed  Ilda Foil 05/24/2011, 10:03 AM  Aida Raider, PT  Office # [phone] Pager [phone]

## 2011-05-24 NOTE — Progress Notes (Signed)
Pt. Discharged 05/24/2011  11:16 AM Discharge instructions reviewed with patient/family. Patient/family verbalized understanding. All Rx's given. Questions answered as needed. Pt. Discharged to home with family/self. Taken off unit via W/C. [facility] Setters

## 2011-05-31 ENCOUNTER — Inpatient Hospital Stay (HOSPITAL_COMMUNITY)
Admission: EM | Admit: 2011-05-31 | Discharge: 2011-06-03 | DRG: 699 | Disposition: A | Discharge status: Home / Self Care | Payer: Managed Care, Other (non HMO) | Attending: Internal Medicine | Admitting: Internal Medicine

## 2011-05-31 ENCOUNTER — Encounter (HOSPITAL_COMMUNITY): Payer: Self-pay | Admitting: Emergency Medicine

## 2011-05-31 ENCOUNTER — Other Ambulatory Visit: Payer: Self-pay

## 2011-05-31 ENCOUNTER — Emergency Department (HOSPITAL_COMMUNITY): Payer: Managed Care, Other (non HMO)

## 2011-05-31 DIAGNOSIS — N320 Bladder-neck obstruction: Principal | ICD-10-CM | POA: Diagnosis present

## 2011-05-31 DIAGNOSIS — N19 Unspecified kidney failure: Secondary | ICD-10-CM

## 2011-05-31 DIAGNOSIS — N179 Acute kidney failure, unspecified: Secondary | ICD-10-CM | POA: Diagnosis present

## 2011-05-31 DIAGNOSIS — D72829 Elevated white blood cell count, unspecified: Secondary | ICD-10-CM | POA: Diagnosis present

## 2011-05-31 DIAGNOSIS — J4489 Other specified chronic obstructive pulmonary disease: Secondary | ICD-10-CM | POA: Diagnosis present

## 2011-05-31 DIAGNOSIS — I10 Essential (primary) hypertension: Secondary | ICD-10-CM | POA: Diagnosis present

## 2011-05-31 DIAGNOSIS — G4733 Obstructive sleep apnea (adult) (pediatric): Secondary | ICD-10-CM | POA: Diagnosis present

## 2011-05-31 DIAGNOSIS — J449 Chronic obstructive pulmonary disease, unspecified: Secondary | ICD-10-CM | POA: Diagnosis present

## 2011-05-31 DIAGNOSIS — E785 Hyperlipidemia, unspecified: Secondary | ICD-10-CM | POA: Diagnosis present

## 2011-05-31 DIAGNOSIS — Z66 Do not resuscitate: Secondary | ICD-10-CM | POA: Diagnosis present

## 2011-05-31 DIAGNOSIS — N139 Obstructive and reflux uropathy, unspecified: Secondary | ICD-10-CM | POA: Diagnosis present

## 2011-05-31 DIAGNOSIS — Z7982 Long term (current) use of aspirin: Secondary | ICD-10-CM

## 2011-05-31 DIAGNOSIS — Z87891 Personal history of nicotine dependence: Secondary | ICD-10-CM

## 2011-05-31 DIAGNOSIS — M549 Dorsalgia, unspecified: Secondary | ICD-10-CM | POA: Diagnosis present

## 2011-05-31 DIAGNOSIS — E119 Type 2 diabetes mellitus without complications: Secondary | ICD-10-CM | POA: Diagnosis present

## 2011-05-31 LAB — URINE MICROSCOPIC-ADD ON

## 2011-05-31 LAB — BASIC METABOLIC PANEL
BUN: 155 mg/dL — ABNORMAL HIGH (ref 6–23)
CO2: 18 mEq/L — ABNORMAL LOW (ref 19–32)
Chloride: 86 mEq/L — ABNORMAL LOW (ref 96–112)
Creatinine, Ser: 14.19 mg/dL — ABNORMAL HIGH (ref 0.50–1.35)
Glucose, Bld: 127 mg/dL — ABNORMAL HIGH (ref 70–99)

## 2011-05-31 LAB — URINALYSIS, ROUTINE W REFLEX MICROSCOPIC
Glucose, UA: NEGATIVE mg/dL
Nitrite: NEGATIVE
Specific Gravity, Urine: 1.011 (ref 1.005–1.030)
pH: 5.5 (ref 5.0–8.0)

## 2011-05-31 LAB — COMPREHENSIVE METABOLIC PANEL
Albumin: 2.7 g/dL — ABNORMAL LOW (ref 3.5–5.2)
BUN: 153 mg/dL — ABNORMAL HIGH (ref 6–23)
Calcium: 9 mg/dL (ref 8.4–10.5)
GFR calc Af Amer: 4 mL/min — ABNORMAL LOW (ref >90)
Glucose, Bld: 160 mg/dL — ABNORMAL HIGH (ref 70–99)
Sodium: 127 mEq/L — ABNORMAL LOW (ref 135–145)
Total Protein: 7.4 g/dL (ref 6.0–8.3)

## 2011-05-31 LAB — CBC
MCH: 27.4 pg (ref 26.0–34.0)
MCHC: 34.1 g/dL (ref 30.0–36.0)
MCV: 80.3 fL (ref 78.0–100.0)
Platelets: 329 10*3/uL (ref 150–400)
RBC: 5.29 MIL/uL (ref 4.22–5.81)

## 2011-05-31 LAB — PRO B NATRIURETIC PEPTIDE: Pro B Natriuretic peptide (BNP): 220 pg/mL — ABNORMAL HIGH (ref 0–125)

## 2011-05-31 LAB — DIFFERENTIAL
Basophils Relative: 0 % (ref 0–1)
Eosinophils Absolute: 0 10*3/uL (ref 0.0–0.7)
Eosinophils Relative: 0 % (ref 0–5)
Lymphs Abs: 1.8 10*3/uL (ref 0.7–4.0)
Monocytes Absolute: 1.8 10*3/uL — ABNORMAL HIGH (ref 0.1–1.0)

## 2011-05-31 MED ORDER — SODIUM CHLORIDE 0.9 % IV SOLN
INTRAVENOUS | Status: AC
  Administered 2011-05-31: 21:00:00 via INTRAVENOUS

## 2011-05-31 NOTE — H&P (Signed)
PCP:   [name], MD, MD   Chief Complaint:  Weakness and swelling  HPI: Patient is a 61 year old white male with past medical history of diabetes mellitus and hypertension and normal renal function was in usual state of health and by review of blood work noted normal renal function several weeks ago. In that time the patient had a left shoulder arthroscopy which was done without incident. For the past one week, the patient has been feeling very tired, increasing dyspnea on exertion, swelling, no appetite and according to his daughter slightly confused. Patient came in today for evaluation. In the emergency room, the patient was found to have a sodium of 1:30 and most concerning was a BUN of 155 and creatinine of 14. His bicarbonate level was down to 18 and he had a calculated anion gap of 26 consistent with uremia consistent with renal failure. Workup was started and the patient did report that he was making urine at home and had voided earlier today. He was not voiding emergency room, so the emergency room place a Foley catheter and immediately voided over 1-1/2 L of urine. It Is also noted the patient's white count was 22 and 84% shift possibly from prolonged pain and urine.  Review of Systems:  When I saw the patient in the emergency room, he was feeling tired. He denied headaches or vision changes or dysphagia. He denies any chest pain, palpitations, shortness of breath, wheeze, cough, abdominal pain, hematuria or dysuria, constipation diarrhea, focal extremity numbness weakness or pain. His major complaints were of fatigue, lack of appetite, feeling of generalized body swelling and feeling like he was in a haze. Review systems otherwise negative. He has stated that since they put a Foley catheter injection starting to feel better.  Past Medical History: Past Medical History  Diagnosis Date  . Arthritis   . Hyperlipidemia     takes Pravastatin daily  . Sleep apnea     CPAP-sleep  study done 2months ago  . Bronchitis     couple of months ago  . Hypertension      takes Lisinopril and AMlodipine daily  . Joint pain     right shoulder and right knee  . Chronic back pain     bulding disc  . Hx of colonic polyps   . Diabetes mellitus     takes Metformin bid   Past Surgical History  Procedure Date  . Tonsillectomy     as a child  . Colonscopy     Medications: Prior to Admission medications   Medication Sig Start Date End Date Taking? Authorizing Provider  amLODipine (NORVASC) 5 MG tablet Take 5 mg by mouth daily.     Yes Historical Provider, MD  aspirin 81 MG chewable tablet Chew 81 mg by mouth daily.     Yes Historical Provider, MD  calcium carbonate (OS-CAL) 600 MG TABS Take 600 mg by mouth daily.    Yes Historical Provider, MD  lisinopril (PRINIVIL,ZESTRIL) 20 MG tablet Take 20 mg by mouth daily.     Yes Historical Provider, MD  metFORMIN (GLUCOPHAGE) 500 MG tablet Take 500 mg by mouth 2 (two) times daily with a meal.     Yes Historical Provider, MD  oxyCODONE-acetaminophen (PERCOCET) 5-325 MG per tablet Take 1-2 tablets by mouth every 6 (six) hours as needed. 05/23/11 06/02/11 Yes Matthew Folks, PA  pravastatin (PRAVACHOL) 40 MG tablet Take 40 mg by mouth every evening.     Yes Historical Provider, MD  Allergies:   Allergies  Allergen Reactions  . Ester-C 600mg  Other (See Comments)    unknown    Social History:  reports that he has quit smoking. He does not have any smokeless tobacco history on file. He reports that he drinks alcohol. He reports that he does not use illicit drugs. The patient is normally at baseline able to participate in activities of daily living without difficulty. Patient was at home alone with daughter who lives in [place].  Family History: Family History  Problem Relation Age of Onset  . Anesthesia problems Neg Hx   . Hypotension Neg Hx   . Malignant hyperthermia Neg Hx   . Pseudochol deficiency Neg Hx     Physical  Exam: Filed Vitals:   05/31/11 2000 05/31/11 2030 05/31/11 2100 05/31/11 2130  BP: 127/56 126/56 127/58 115/48  Pulse: 88 88 88 87  Temp:      TempSrc:      Resp: 30     SpO2: 96% 95% 96% 96%   General: Alert and oriented x3, no acute distress, looks fatigued, looks older than stated age HEENT, [place] atraumatic, mucous membranes are dry Cardiovascular: Regular rate and rhythm, S1-S2 Lungs:" Bilaterally Abdomen: Soft, nontender, morbidly obese, positive bowel sounds Extremities: No clubbing or cyanosis, 1+ pitting edema from the mid calf down   Labs on Admission:   [facility] 05/31/11 2108 05/31/11 1826  NA 130* 127*  K 4.6 4.7  CL 86* 87*  CO2 18* 16*  GLUCOSE 127* 160*  BUN 155* 153*  CREATININE 14.19* 13.79*  CALCIUM 9.2 9.0  MG -- --  PHOS -- --    Basename 05/31/11 1826  AST 24  ALT 33  ALKPHOS 103  BILITOT 0.5  PROT 7.4  ALBUMIN 2.7*    Basename 05/31/11 1826  WBC 22.1*  NEUTROABS 18.5*  HGB 14.5  HCT 42.5  MCV 80.3  PLT 329    Radiological Exams on Admission:  Dg Chest Portable 1 View  05/31/2011   IMPRESSION: Cardiomegaly and low lung volumes.  Bibasilar airspace disease.  Although this could represent atelectasis and be secondary to low lung volumes/AP portable technique, infection and small pleural effusions.  cannot be excluded.  Consider further evaluation with PA and lateral radiographs.  Assessment/Plan Present on Admission:  .ARF (acute renal failure): Almost certainly entirely to bladder outlet obstruction. I spoken with nephrology who agrees with the plan for continuing Foley catheter plus hydration plus consult to urology. We'll also check a renal ultrasound to ensure that there are no secondary issues.  .Bladder outlet obstruction: Patient states he was evaluated by urology a year ago and told her that time he did not have any cancer issues. He does not say he does not recall that he has any prostate problems.  Marland KitchenHTN (hypertension):  Given his elevated BUN and intravascular volume depletion, holding his antihypertensives, especially his ACE inhibitor, diuretics and aspirin.  .DM type 2 (diabetes mellitus, type 2): Sliding scale for now until his appetite improves  After discussion with the patient, he is to be a DO NOT RESUSCITATE.  We will respect these wishes.  I anticipate his length of stay to be 2-3 days based on exam, history, lab work-dependent on how quickly his renal function returns to normal plus what urology decides to do.  Time spent on this patient including examination and decision-making process: 50 minutes.  Hollice Espy [phone] 05/31/2011, 10:26 PM

## 2011-05-31 NOTE — ED Provider Notes (Signed)
History     CSN: [id number]  Arrival date & time 05/31/11  1628   First MD Initiated Contact with Patient 05/31/11 1731      Chief Complaint  Patient presents with  . Abdominal Pain    (Consider location/radiation/quality/duration/timing/severity/associated sxs/prior treatment) Patient is a 61 y.o. male presenting with abdominal pain. The history is provided by the patient (Patient complains of weakness and also a full feeling in his belly. He recently had surgery 8 days ago to his left shoulder.). No language interpreter was used.  Abdominal Pain The primary symptoms of the illness include abdominal pain and vaginal discharge. The primary symptoms of the illness do not include fatigue or diarrhea. The current episode started 2 days ago. The onset of the illness was gradual. The problem has not changed since onset. The abdominal pain began 2 days ago. The pain came on gradually. The abdominal pain has been unchanged since its onset. The abdominal pain is generalized. The abdominal pain does not radiate. The severity of the abdominal pain is 1/10. The abdominal pain is relieved by nothing.  The patient states that she believes she is currently not pregnant. The patient has had a change in bowel habit. Symptoms associated with the illness do not include chills, hematuria, frequency or back pain. Significant associated medical issues do not include PUD.    Past Medical History  Diagnosis Date  . Arthritis   . Hyperlipidemia     takes Pravastatin daily  . Sleep apnea     CPAP-sleep study done 2months ago  . Bronchitis     couple of months ago  . Hypertension      takes Lisinopril and AMlodipine daily  . Joint pain     right shoulder and right knee  . Chronic back pain     bulding disc  . Hx of colonic polyps   . Diabetes mellitus     takes Metformin bid    Past Surgical History  Procedure Date  . Tonsillectomy     as a child  . Colonscopy     Family History  Problem  Relation Age of Onset  . Anesthesia problems Neg Hx   . Hypotension Neg Hx   . Malignant hyperthermia Neg Hx   . Pseudochol deficiency Neg Hx     History  Substance Use Topics  . Smoking status: Former Games developer  . Smokeless tobacco: Not on file   Comment: quit 20+yrs ago  . Alcohol Use: Yes     occasionally       Review of Systems  Constitutional: Negative for chills and fatigue.  HENT: Negative for congestion, sinus pressure and ear discharge.   Eyes: Negative for discharge.  Respiratory: Negative for cough.   Cardiovascular: Negative for chest pain.  Gastrointestinal: Positive for abdominal pain. Negative for diarrhea.  Genitourinary: Positive for vaginal discharge. Negative for frequency and hematuria.  Musculoskeletal: Negative for back pain.  Skin: Negative for rash.  Neurological: Negative for seizures and headaches.  Hematological: Negative.   Psychiatric/Behavioral: Negative for hallucinations.    Allergies  Ester-c 600mg   Home Medications   Current Outpatient Rx  Name Route Sig Dispense Refill  . AMLODIPINE BESYLATE 5 MG PO TABS Oral Take 5 mg by mouth daily.      . ASPIRIN 81 MG PO CHEW Oral Chew 81 mg by mouth daily.      Marland Kitchen CALCIUM CARBONATE 600 MG PO TABS Oral Take 600 mg by mouth daily.     Marland Kitchen  LISINOPRIL 20 MG PO TABS Oral Take 20 mg by mouth daily.      Marland Kitchen METFORMIN HCL 500 MG PO TABS Oral Take 500 mg by mouth 2 (two) times daily with a meal.      . OXYCODONE-ACETAMINOPHEN 5-325 MG PO TABS Oral Take 1-2 tablets by mouth every 6 (six) hours as needed.    Marland Kitchen PRAVASTATIN SODIUM 40 MG PO TABS Oral Take 40 mg by mouth every evening.        BP 126/56  Pulse 88  Temp(Src) 97.8 F (36.6 C) (Oral)  Resp 30  SpO2 95%  Physical Exam  Constitutional: He is oriented to person, place, and time. He appears well-developed.  HENT:  Head: Normocephalic and atraumatic.  Eyes: Conjunctivae and EOM are normal. No scleral icterus.  Neck: Neck supple. No thyromegaly  present.  Cardiovascular: Normal rate and regular rhythm.  Exam reveals no gallop and no friction rub.   No murmur heard. Pulmonary/Chest: No stridor. He has no wheezes. He has no rales. He exhibits no tenderness.  Abdominal: He exhibits no distension. There is no tenderness. There is no rebound.  Musculoskeletal: Normal range of motion. He exhibits edema.  Lymphadenopathy:    He has no cervical adenopathy.  Neurological: He is oriented to person, place, and time. Coordination normal.  Skin: No rash noted. No erythema.  Psychiatric: He has a normal mood and affect. His behavior is normal.    ED Course  Procedures (including critical care time)  Labs Reviewed  CBC - Abnormal; Notable for the following:    WBC 22.1 (*)    All other components within normal limits  DIFFERENTIAL - Abnormal; Notable for the following:    Neutrophils Relative 84 (*)    Lymphocytes Relative 8 (*)    Neutro Abs 18.5 (*)    Monocytes Absolute 1.8 (*)    All other components within normal limits  COMPREHENSIVE METABOLIC PANEL - Abnormal; Notable for the following:    Sodium 127 (*)    Chloride 87 (*)    CO2 16 (*)    Glucose, Bld 160 (*)    BUN 153 (*)    Creatinine, Ser 13.79 (*)    Albumin 2.7 (*)    GFR calc non Af Amer 3 (*)    GFR calc Af Amer 4 (*)    All other components within normal limits  PRO B NATRIURETIC PEPTIDE - Abnormal; Notable for the following:    Pro B Natriuretic peptide (BNP) 220.0 (*)    All other components within normal limits  URINALYSIS, ROUTINE W REFLEX MICROSCOPIC  URINE CULTURE  BASIC METABOLIC PANEL   Dg Chest Portable 1 View  05/31/2011  *RADIOLOGY REPORT*  Clinical Data: Shortness of breath and weakness for 2 days. History of bronchitis, hypertension, and diabetes.  Ex-smoker.  PORTABLE CHEST - 1 VIEW  Comparison: 05/16/2011  Findings: Mildly degraded exam due to AP portable technique and patient body habitus.  Patient rotated left. Cardiomegaly accentuated by AP  portable technique.  Cannot exclude small bilateral pleural effusions. No pneumothorax.  Low lung volumes with resultant pulmonary interstitial prominence.  Patchy left greater right bibasilar airspace disease.  IMPRESSION: Cardiomegaly and low lung volumes.  Bibasilar airspace disease.  Although this could represent atelectasis and be secondary to low lung volumes/AP portable technique, infection and small pleural effusions.  cannot be excluded.  Consider further evaluation with PA and lateral radiographs.  Original Report Authenticated By: Consuello Bossier, M.D.  No diagnosis found.    MDM  Renal failure. Patient had a Foley placed which put out 1400 cc. Renal failure possibly due to obstruction.        Benny Lennert, MD 05/31/11 2104

## 2011-05-31 NOTE — ED Notes (Signed)
MD at bedside. Hospitalist with patient 

## 2011-05-31 NOTE — ED Notes (Signed)
Pt had shoulder surg. And was discharged from hosp on 2/8.  St's has had problems with constipation but now abd is painful and distended.  Also c/o increased shortness of breath and weakness

## 2011-06-01 ENCOUNTER — Encounter (HOSPITAL_COMMUNITY): Payer: Self-pay | Admitting: *Deleted

## 2011-06-01 ENCOUNTER — Inpatient Hospital Stay (HOSPITAL_COMMUNITY): Payer: Managed Care, Other (non HMO)

## 2011-06-01 LAB — BASIC METABOLIC PANEL
BUN: 122 mg/dL — ABNORMAL HIGH (ref 6–23)
CO2: 22 mEq/L (ref 19–32)
GFR calc non Af Amer: 8 mL/min — ABNORMAL LOW (ref >90)
Glucose, Bld: 178 mg/dL — ABNORMAL HIGH (ref 70–99)
Potassium: 3.9 mEq/L (ref 3.5–5.1)
Sodium: 134 mEq/L — ABNORMAL LOW (ref 135–145)

## 2011-06-01 LAB — GLUCOSE, CAPILLARY: Glucose-Capillary: 178 mg/dL — ABNORMAL HIGH (ref 70–99)

## 2011-06-01 LAB — CBC
Hemoglobin: 12.6 g/dL — ABNORMAL LOW (ref 13.0–17.0)
MCH: 27.2 pg (ref 26.0–34.0)
MCHC: 34.5 g/dL (ref 30.0–36.0)
MCV: 78.7 fL (ref 78.0–100.0)
RBC: 4.64 MIL/uL (ref 4.22–5.81)

## 2011-06-01 MED ORDER — SODIUM CHLORIDE 0.9 % IV SOLN
INTRAVENOUS | Status: DC
  Administered 2011-06-02 (×2): via INTRAVENOUS

## 2011-06-01 MED ORDER — IOHEXOL 300 MG/ML  SOLN: 20.0000 mL | INTRAMUSCULAR | Status: AC

## 2011-06-01 MED ORDER — ONDANSETRON HCL 4 MG PO TABS: 4.0000 mg | ORAL_TABLET | Freq: Four times a day (QID) | ORAL | Status: DC | PRN

## 2011-06-01 MED ORDER — TAMSULOSIN HCL 0.4 MG PO CAPS
0.4000 mg | ORAL_CAPSULE | Freq: Every day | ORAL | Status: DC
  Administered 2011-06-01 – 2011-06-02 (×2): 0.4 mg via ORAL
  Filled 2011-06-01 (×4): qty 1

## 2011-06-01 MED ORDER — ACETAMINOPHEN 325 MG PO TABS: 650.0000 mg | ORAL_TABLET | Freq: Four times a day (QID) | ORAL | Status: DC | PRN

## 2011-06-01 MED ORDER — INSULIN ASPART 100 UNIT/ML ~~LOC~~ SOLN: 0.0000 [IU] | Freq: Every day | SUBCUTANEOUS | Status: DC

## 2011-06-01 MED ORDER — MORPHINE SULFATE 2 MG/ML IJ SOLN: 1.0000 mg | INTRAMUSCULAR | Status: DC | PRN

## 2011-06-01 MED ORDER — ONDANSETRON HCL 4 MG/2ML IJ SOLN: 4.0000 mg | Freq: Four times a day (QID) | INTRAMUSCULAR | Status: DC | PRN

## 2011-06-01 MED ORDER — ACETAMINOPHEN 650 MG RE SUPP: 650.0000 mg | Freq: Four times a day (QID) | RECTAL | Status: DC | PRN

## 2011-06-01 MED ORDER — INSULIN ASPART 100 UNIT/ML ~~LOC~~ SOLN
0.0000 [IU] | Freq: Three times a day (TID) | SUBCUTANEOUS | Status: DC
  Administered 2011-06-01 – 2011-06-02 (×2): 2 [IU] via SUBCUTANEOUS
  Administered 2011-06-02: 5 [IU] via SUBCUTANEOUS
  Administered 2011-06-02: 1 [IU] via SUBCUTANEOUS
  Filled 2011-06-01 (×2): qty 3

## 2011-06-01 NOTE — Consult Note (Signed)
Consult: Acute urinary retention, Acute renal failure Referring physician: Dr. Roseanne Reno  Patient is a 61 year old white male who underwent right shoulder surgery about 9 days ago. Post-op he required in and out cath due to urinary retention. He was d/c'd home the same day. He had trouble all week with bowel movements and urinating. He had a weak stream and intermittent flow. By yesterday, he was feeling very tired, increasing dyspnea on exertion, swelling, no appetite and according to his daughter slightly confused. In the emergency room, the patient was found to have a sodium of 130 and most concerning was a BUN of 155 and creatinine of 14. His bicarbonate level was down to 18 and he had a calculated anion gap of 26 consistent with uremia consistent with renal failure. A catheter was placed and drained over 2 liters. His Cr was 0.84 Nov 2012.  He continues to improve and is feeling much better today.   Prior to this he reports voiding with a good flow and no bothersome frequency or urgency. No dysuria or hematuria. No prostate meds.  He was seen by Dr. Laverle Patter in 2010 with a normal CT scan and negative NMP22. Pt declined cystoscopy.   Review of Systems:  Fatigue/tired. He denied headaches or vision changes or dysphagia. He denies any chest pain, palpitations, shortness of breath, wheeze, cough, abdominal pain, hematuria or dysuria, constipation diarrhea, focal extremity numbness weakness or pain. His major complaints were of fatigue, lack of appetite, feeling of generalized body swelling and feeling like he was in a haze. Review systems otherwise negative. He has stated that since they put a Foley catheter injection starting to feel better.  Past Medical History  Diagnosis Date  . Arthritis   . Hyperlipidemia     takes Pravastatin daily  . Sleep apnea     CPAP-sleep study done 2months ago  . Bronchitis     couple of months ago  . Hypertension      takes Lisinopril and AMlodipine daily  .  Joint pain     right shoulder and right knee  . Chronic back pain     bulding disc  . Hx of colonic polyps   . Diabetes mellitus     takes Metformin bid   Past Surgical History  Procedure Date  . Tonsillectomy     as a child  . Colonscopy   . Rt shoulder surgery     bone spur,scrape rotor cuff    Home Medications:  Prescriptions prior to admission  Medication Sig Dispense Refill  . amLODipine (NORVASC) 5 MG tablet Take 5 mg by mouth daily.        Marland Kitchen aspirin 81 MG chewable tablet Chew 81 mg by mouth daily.        . calcium carbonate (OS-CAL) 600 MG TABS Take 600 mg by mouth daily.       Marland Kitchen lisinopril (PRINIVIL,ZESTRIL) 20 MG tablet Take 20 mg by mouth daily.        . metFORMIN (GLUCOPHAGE) 500 MG tablet Take 500 mg by mouth 2 (two) times daily with a meal.        . oxyCODONE-acetaminophen (PERCOCET) 5-325 MG per tablet Take 1-2 tablets by mouth every 6 (six) hours as needed.      . pravastatin (PRAVACHOL) 40 MG tablet Take 40 mg by mouth every evening.         Allergies:  Allergies  Allergen Reactions  . Ester-C 600mg  Other (See Comments)    unknown  Family History  Problem Relation Age of Onset  . Anesthesia problems Neg Hx   . Hypotension Neg Hx   . Malignant hyperthermia Neg Hx   . Pseudochol deficiency Neg Hx    Social History:  reports that he has quit smoking. He does not have any smokeless tobacco history on file. He reports that he drinks alcohol. He reports that he does not use illicit drugs.    Physical Exam:  Vital signs in last 24 hours: Temp:  [97.1 F (36.2 C)-98.8 F (37.1 C)] 97.6 F (36.4 C) (02/17 1323) Pulse Rate:  [79-96] 96  (02/17 1323) Resp:  [18-36] 20  (02/17 1323) BP: (108-135)/(48-94) 135/78 mmHg (02/17 1323) SpO2:  [91 %-98 %] 93 % (02/17 1323) Weight:  [137.8 kg (303 lb 12.7 oz)] 137.8 kg (303 lb 12.7 oz) (02/16 2346) General:  Alert and oriented, No acute distress. Sitting in chair. HEENT: Normocephalic, atraumatic Neck: No JVD  or lymphadenopathy Cardiovascular: Regular rate and rhythm Lungs: normal rate, effort Abdomen: Soft, nontender, nondistended, no abdominal masses Back: No CVA tenderness Genitourinary:  Foley in place - bag fully distended with urine all the way up tube (nurse has been called) Extremities: + edema Neurologic: Grossly intact  Laboratory Data:  Results for orders placed during the hospital encounter of 05/31/11 (from the past 24 hour(s))  CBC     Status: Abnormal   Collection Time   05/31/11  6:26 PM      Component Value Range   WBC 22.1 (*) 4.0 - 10.5 (K/uL)   RBC 5.29  4.22 - 5.81 (MIL/uL)   Hemoglobin 14.5  13.0 - 17.0 (g/dL)   HCT 16.1  09.6 - 04.5 (%)   MCV 80.3  78.0 - 100.0 (fL)   MCH 27.4  26.0 - 34.0 (pg)   MCHC 34.1  30.0 - 36.0 (g/dL)   RDW 40.9  81.1 - 91.4 (%)   Platelets 329  150 - 400 (K/uL)  DIFFERENTIAL     Status: Abnormal   Collection Time   05/31/11  6:26 PM      Component Value Range   Neutrophils Relative 84 (*) 43 - 77 (%)   Lymphocytes Relative 8 (*) 12 - 46 (%)   Monocytes Relative 8  3 - 12 (%)   Eosinophils Relative 0  0 - 5 (%)   Basophils Relative 0  0 - 1 (%)   Neutro Abs 18.5 (*) 1.7 - 7.7 (K/uL)   Lymphs Abs 1.8  0.7 - 4.0 (K/uL)   Monocytes Absolute 1.8 (*) 0.1 - 1.0 (K/uL)   Eosinophils Absolute 0.0  0.0 - 0.7 (K/uL)   Basophils Absolute 0.0  0.0 - 0.1 (K/uL)  COMPREHENSIVE METABOLIC PANEL     Status: Abnormal   Collection Time   05/31/11  6:26 PM      Component Value Range   Sodium 127 (*) 135 - 145 (mEq/L)   Potassium 4.7  3.5 - 5.1 (mEq/L)   Chloride 87 (*) 96 - 112 (mEq/L)   CO2 16 (*) 19 - 32 (mEq/L)   Glucose, Bld 160 (*) 70 - 99 (mg/dL)   BUN 782 (*) 6 - 23 (mg/dL)   Creatinine, Ser 95.62 (*) 0.50 - 1.35 (mg/dL)   Calcium 9.0  8.4 - 13.0 (mg/dL)   Total Protein 7.4  6.0 - 8.3 (g/dL)   Albumin 2.7 (*) 3.5 - 5.2 (g/dL)   AST 24  0 - 37 (U/L)   ALT 33  0 - 53 (  U/L)   Alkaline Phosphatase 103  39 - 117 (U/L)   Total Bilirubin 0.5   0.3 - 1.2 (mg/dL)   GFR calc non Af Amer 3 (*) >90 (mL/min)   GFR calc Af Amer 4 (*) >90 (mL/min)  PRO B NATRIURETIC PEPTIDE     Status: Abnormal   Collection Time   05/31/11  6:26 PM      Component Value Range   Pro B Natriuretic peptide (BNP) 220.0 (*) 0 - 125 (pg/mL)  URINALYSIS, ROUTINE W REFLEX MICROSCOPIC     Status: Abnormal   Collection Time   05/31/11  8:48 PM      Component Value Range   Color, Urine YELLOW  YELLOW    APPearance CLEAR  CLEAR    Specific Gravity, Urine 1.011  1.005 - 1.030    pH 5.5  5.0 - 8.0    Glucose, UA NEGATIVE  NEGATIVE (mg/dL)   Hgb urine dipstick LARGE (*) NEGATIVE    Bilirubin Urine NEGATIVE  NEGATIVE    Ketones, ur NEGATIVE  NEGATIVE (mg/dL)   Protein, ur NEGATIVE  NEGATIVE (mg/dL)   Urobilinogen, UA 0.2  0.0 - 1.0 (mg/dL)   Nitrite NEGATIVE  NEGATIVE    Leukocytes, UA MODERATE (*) NEGATIVE   URINE MICROSCOPIC-ADD ON     Status: Normal   Collection Time   05/31/11  8:48 PM      Component Value Range   Squamous Epithelial / LPF RARE  RARE    WBC, UA 7-10  <3 (WBC/hpf)   RBC / HPF 11-20  <3 (RBC/hpf)   Urine-Other AMORPHOUS URATES/PHOSPHATES    BASIC METABOLIC PANEL     Status: Abnormal   Collection Time   05/31/11  9:08 PM      Component Value Range   Sodium 130 (*) 135 - 145 (mEq/L)   Potassium 4.6  3.5 - 5.1 (mEq/L)   Chloride 86 (*) 96 - 112 (mEq/L)   CO2 18 (*) 19 - 32 (mEq/L)   Glucose, Bld 127 (*) 70 - 99 (mg/dL)   BUN 161 (*) 6 - 23 (mg/dL)   Creatinine, Ser 09.60 (*) 0.50 - 1.35 (mg/dL)   Calcium 9.2  8.4 - 45.4 (mg/dL)   GFR calc non Af Amer 3 (*) >90 (mL/min)   GFR calc Af Amer 4 (*) >90 (mL/min)  GLUCOSE, CAPILLARY     Status: Abnormal   Collection Time   05/31/11 11:57 PM      Component Value Range   Glucose-Capillary 156 (*) 70 - 99 (mg/dL)  BASIC METABOLIC PANEL     Status: Abnormal   Collection Time   06/01/11  5:35 AM      Component Value Range   Sodium 134 (*) 135 - 145 (mEq/L)   Potassium 3.9  3.5 - 5.1 (mEq/L)    Chloride 98  96 - 112 (mEq/L)   CO2 22  19 - 32 (mEq/L)   Glucose, Bld 178 (*) 70 - 99 (mg/dL)   BUN 098 (*) 6 - 23 (mg/dL)   Creatinine, Ser 1.19 (*) 0.50 - 1.35 (mg/dL)   Calcium 8.7  8.4 - 14.7 (mg/dL)   GFR calc non Af Amer 8 (*) >90 (mL/min)   GFR calc Af Amer 9 (*) >90 (mL/min)  CBC     Status: Abnormal   Collection Time   06/01/11  5:35 AM      Component Value Range   WBC 15.9 (*) 4.0 - 10.5 (K/uL)   RBC 4.64  4.22 - 5.81 (MIL/uL)   Hemoglobin 12.6 (*) 13.0 - 17.0 (g/dL)   HCT 04.5 (*) 40.9 - 52.0 (%)   MCV 78.7  78.0 - 100.0 (fL)   MCH 27.2  26.0 - 34.0 (pg)   MCHC 34.5  30.0 - 36.0 (g/dL)   RDW 81.1  91.4 - 78.2 (%)   Platelets 271  150 - 400 (K/uL)  GLUCOSE, CAPILLARY     Status: Abnormal   Collection Time   06/01/11  7:48 AM      Component Value Range   Glucose-Capillary 178 (*) 70 - 99 (mg/dL)   No results found for this or any previous visit (from the past 240 hour(s)). Creatinine:  Basename 06/01/11 0535 05/31/11 2108 05/31/11 1826  CREATININE 6.84* 14.19* 13.79*    Impression/Assessment:  Acute urinary retention - likely multifactorial - anesthesia, surgery, pain meds, constipation. Acute renal failure - improving quickly Post-obstructive diuresis - watch UOP and electrolytes closely. Pt may need replacement. He was instructed to drink if he feels thirsty.  Plan:  -I'm going to start him on tamsulosin. I discussed the nature, R/B with patient and he agrees to start.  -Continue foley until renal failure/edema resolves, pt ambulatory and bowels moving. May go home with foley and foley up with Dr. Laverle Patter 1-2 weeks.  Antony Haste 06/01/2011, 6:04 PM

## 2011-06-01 NOTE — Progress Notes (Signed)
Subjective: Pt feel some better No abd pain.  No shoulder pain  Objective: Vital signs in last 24 hours: Temp:  [97.1 F (36.2 C)-98.8 F (37.1 C)] 98.8 F (37.1 C) (02/17 0621) Pulse Rate:  [79-91] 79  (02/17 0621) Resp:  [18-36] 18  (02/17 0621) BP: (108-131)/(48-94) 131/71 mmHg (02/17 0621) SpO2:  [91 %-98 %] 92 % (02/17 0621) Weight:  [137.8 kg (303 lb 12.7 oz)] 137.8 kg (303 lb 12.7 oz) (02/16 2346) Weight change:     Intake/Output from previous day: 02/16 0701 - 02/17 0700 In: 240 [P.O.:240] Out: 6450 [Urine:6450] Intake/Output this shift:    General appearance: alert Resp: clear to auscultation bilaterally Cardio: regular rate and rhythm, S1, S2 normal, no murmur, click, rub or gallop GI: soft, non-tender; bowel sounds normal; no masses,  no organomegaly  Lab Results:  [facility] 06/01/11 0535 05/31/11 1826  WBC 15.9* 22.1*  HGB 12.6* 14.5  HCT 36.5* 42.5  PLT 271 329   BMET  Basename 06/01/11 0535 05/31/11 2108  NA 134* 130*  K 3.9 4.6  CL 98 86*  CO2 22 18*  GLUCOSE 178* 127*  BUN 122* 155*  CREATININE 6.84* 14.19*  CALCIUM 8.7 9.2    Studies/Results: Dg Chest Portable 1 View  05/31/2011  *RADIOLOGY REPORT*  Clinical Data: Shortness of breath and weakness for 2 days. History of bronchitis, hypertension, and diabetes.  Ex-smoker.  PORTABLE CHEST - 1 VIEW  Comparison: 05/16/2011  Findings: Mildly degraded exam due to AP portable technique and patient body habitus.  Patient rotated left. Cardiomegaly accentuated by AP portable technique.  Cannot exclude small bilateral pleural effusions. No pneumothorax.  Low lung volumes with resultant pulmonary interstitial prominence.  Patchy left greater right bibasilar airspace disease.  IMPRESSION: Cardiomegaly and low lung volumes.  Bibasilar airspace disease.  Although this could represent atelectasis and be secondary to low lung volumes/AP portable technique, infection and small pleural effusions.  cannot be  excluded.  Consider further evaluation with PA and lateral radiographs.  Original Report Authenticated By: Consuello Bossier, M.D.    Medications: I have reviewed the patient's current medications.  Assessment/Plan: ARF- BLADDER OUTLET OBSTRUCTION MOST LIKELY ETIOLOGY- DUE TO EITHER PROSTATE OR RECENT ANESTHESIA MEDS RENAL FUNCTION IMPROVING, URINE OUT PUT GOOD U/S RENAL UROLOGY CONSULT IVF LEUCOCYTOSIS IMPROVING ELECTROLYTE BETTER HTN: HOLD OFF MED Dm SS INSULIN   LOS: 1 day   Joseph Clay 06/01/2011, 9:21 AM

## 2011-06-01 NOTE — Progress Notes (Signed)
Dr. Donette Larry also informed that patient was refusing sliding scale insulin, per Dr. Donette Larry instruction, patient was informed that due to his increase BUN-122 and creat. -6.84 the treatment for his diabetes was SQ insulin at this time.  Patient and wife voiced understanding of all instruction.

## 2011-06-01 NOTE — Progress Notes (Signed)
Patient c/o abdominal pain, increased post eating.  Abdomen. distended with hypoactive bowel sound.  Call placed to Dr. Donette Larry and new orders received.

## 2011-06-02 LAB — GLUCOSE, CAPILLARY
Glucose-Capillary: 151 mg/dL — ABNORMAL HIGH (ref 70–99)
Glucose-Capillary: 252 mg/dL — ABNORMAL HIGH (ref 70–99)
Glucose-Capillary: 164 mg/dL — ABNORMAL HIGH (ref 70–99)
Glucose-Capillary: 158 mg/dL — ABNORMAL HIGH (ref 70–99)

## 2011-06-02 LAB — BASIC METABOLIC PANEL
CO2: 25 mEq/L (ref 19–32)
Chloride: 108 mEq/L (ref 96–112)
Creatinine, Ser: 1.1 mg/dL (ref 0.50–1.35)
GFR calc Af Amer: 81 mL/min — ABNORMAL LOW (ref >90)
Potassium: 4.3 mEq/L (ref 3.5–5.1)
Sodium: 143 mEq/L (ref 135–145)

## 2011-06-02 LAB — CBC
HCT: 37.1 % — ABNORMAL LOW (ref 39.0–52.0)
Hemoglobin: 12.1 g/dL — ABNORMAL LOW (ref 13.0–17.0)
Hemoglobin: 12.1 g/dL — ABNORMAL LOW (ref 13.0–17.0)
MCH: 26.8 pg (ref 26.0–34.0)
MCHC: 32.6 g/dL (ref 30.0–36.0)
MCV: 82.3 fL (ref 78.0–100.0)
RBC: 4.55 MIL/uL (ref 4.22–5.81)
RDW: 15.4 % (ref 11.5–15.5)
WBC: 10.2 10*3/uL (ref 4.0–10.5)

## 2011-06-02 LAB — URINE CULTURE

## 2011-06-02 MED ORDER — DEXTROSE 5 % IV SOLN
1.0000 g | INTRAVENOUS | Status: DC
  Administered 2011-06-02 – 2011-06-03 (×2): 1 g via INTRAVENOUS
  Filled 2011-06-02 (×2): qty 10

## 2011-06-02 NOTE — Progress Notes (Signed)
Subjective: Patient without complaint.  Objective: Vital signs in last 24 hours: Temp:  [97.5 F (36.4 C)-99.1 F (37.3 C)] 99.1 F (37.3 C) (02/18 0945) Pulse Rate:  [68-91] 68  (02/18 0945) Resp:  [18-20] 20  (02/18 0945) BP: (105-135)/(55-97) 105/55 mmHg (02/18 0945) SpO2:  [94 %-98 %] 97 % (02/18 0945)  Intake/Output from previous day: 02/17 0701 - 02/18 0700 In: 1040 [P.O.:1040] Out: 6150 [Urine:6150] Intake/Output this shift: Total I/O In: 360 [P.O.:360] Out: 2826 [Urine:2825; Stool:1]  Physical Exam:  Well appearing GU - urine clear  Lab Results:  Basename 06/02/11 0908 06/02/11 0645 06/01/11 0535  HGB 12.1* 12.1* 12.6*  HCT 37.6* 37.1* 36.5*   BMET  Basename 06/02/11 0908 06/01/11 0535  NA 143 134*  K 4.3 3.9  CL 108 98  CO2 25 22  GLUCOSE 238* 178*  BUN 37* 122*  CREATININE 1.10 6.84*  CALCIUM 8.3* 8.7   No results found for this basename: LABPT:3,INR:3 in the last 72 hours No results found for this basename: LABURIN:1 in the last 72 hours Results for orders placed during the hospital encounter of 05/31/11  URINE CULTURE     Status: Normal   Collection Time   05/31/11  8:49 PM      Component Value Range Status Comment   Specimen Description URINE, CLEAN CATCH   Final    Special Requests NONE   Final    Culture  Setup Time [id number]   Final    Colony Count NO GROWTH   Final    Culture NO GROWTH   Final    Report Status 06/02/2011 FINAL   Final     Studies/Results: Ct Abdomen Pelvis Wo Contrast  06/01/2011  *RADIOLOGY REPORT*  Clinical Data: Bilateral upper abdominal pain earlier today.  CT ABDOMEN AND PELVIS WITHOUT CONTRAST  Technique:  Multidetector CT imaging of the abdomen and pelvis was performed following the standard protocol without intravenous contrast.  Comparison: CT urogram dated 02/11/2007.  Findings: 1.2 cm gallstone in the gallbladder.  No gallbladder wall thickening or pericholecystic fluid.  Interval mild to moderate  dilatation of both renal collecting systems with significant soft tissue stranding around both renal collecting systems and extending inferiorly along the proximal ureters.  No significant ureteral dilatation.  No urinary tract calculi visualized.  Foley catheter in the urinary bladder.  No significant urine in the bladder.  There is some air in the bladder.  Mildly enlarged prostate gland with calcifications.  Multiple colonic diverticula without evidence of diverticulitis.  No enlarged lymph nodes.  Unremarkable noncontrasted appearance of the liver, spleen, pancreas and adrenal glands.  Clear lung bases.  Lumbar and lower thoracic spine degenerative changes.  IMPRESSION:  1.  Interval mild to moderate bilateral hydronephrosis and extensive soft tissue stranding around both renal collecting systems and extending along the proximal ureters.  No obstructing calculi seen.  Although soft tissue stranding can be seen in association with obstruction, this is concerning for the possibility of bilateral collecting system infection. 2.  Cholelithiasis without evidence of cholecystitis.  Original Report Authenticated By: Darrol Angel, M.D.   US Renal  06/02/2011  *RADIOLOGY REPORT*  Clinical Data:  Sudden onset of acute renal failure.  RENAL/URINARY TRACT ULTRASOUND COMPLETE  Comparison:  CT of the abdomen and pelvis performed earlier today at 05:40 p.m.  Findings:  Right Kidney:  The right kidney measures 15.3 cm in length.  The kidney demonstrates grossly normal echogenicity and configuration. No significant cortical thinning is seen.  No masses are identified; known small renal cysts are not well characterized. Mild hydronephrosis, as characterized on the recent CT of the abdomen and pelvis, is not well seen on ultrasound; this is thought to reflect complex fluid within the right renal collecting system.  Left Kidney:  The left kidney measures 16.0 cm in length.  The kidney demonstrates grossly normal echogenicity  and configuration. No significant cortical thinning is seen.  No masses are seen. Mild hydronephrosis, as characterized on the recent CT of the abdomen and pelvis, is not well seen on ultrasound; this is thought to reflect complex fluid within the left renal collecting system.  Bladder:  The bladder is decompressed, with a Foley catheter in place.  A stone is seen layering dependently within the gallbladder; the gallbladder is otherwise grossly unremarkable in appearance.  IMPRESSION:  1.  Bilateral nephromegaly; mild hydronephrosis, as characterized on the recent CT of the abdomen and pelvis, is not well seen on ultrasound.  This is thought to reflect complex fluid within the renal collecting systems bilaterally.  On correlation with the prior CT, the appearance is highly suspicious for severe bilateral ureteritis and likely mild bilateral pyelonephritis; this likely explains the patient's renal failure.  No definite obstructing stones are seen. 2.  Cholelithiasis noted; gallbladder otherwise unremarkable in appearance.  These results were called by telephone on 06/01/2011  at  02:13 a.m. to  [facility] RN, who verbally acknowledged these results.  Original Report Authenticated By: Tonia Ghent, M.D.   Dg Chest Portable 1 View  05/31/2011  *RADIOLOGY REPORT*  Clinical Data: Shortness of breath and weakness for 2 days. History of bronchitis, hypertension, and diabetes.  Ex-smoker.  PORTABLE CHEST - 1 VIEW  Comparison: 05/16/2011  Findings: Mildly degraded exam due to AP portable technique and patient body habitus.  Patient rotated left. Cardiomegaly accentuated by AP portable technique.  Cannot exclude small bilateral pleural effusions. No pneumothorax.  Low lung volumes with resultant pulmonary interstitial prominence.  Patchy left greater right bibasilar airspace disease.  IMPRESSION: Cardiomegaly and low lung volumes.  Bibasilar airspace disease.  Although this could represent atelectasis and be secondary to low  lung volumes/AP portable technique, infection and small pleural effusions.  cannot be excluded.  Consider further evaluation with PA and lateral radiographs.  Original Report Authenticated By: Consuello Bossier, M.D.   I reviewed CT, Renal US images.  Assessment/Plan:  ARF - resolving. CT and renal US show normal GU tract with expected changes from such significant and prolonged retention.  Acute urinary retention - pt started tamsulosin.  Post-obstructive diuresis - ongoing.   Plan - continue foley.   Watch UOP and electrolytes until diuresis resolves.   Would keep foley for 7-10 days. May d/c home with foley/leg bag/overnight bag. Continue tamsulosin. F/u with Dr. Laverle Patter. Discussed plan with patient.    LOS: 2 days   Antony Haste 06/02/2011, 2:22 PM

## 2011-06-02 NOTE — Progress Notes (Signed)
Subjective: Feels much better  Objective: Vital signs in last 24 hours: Temp:  [97.3 F (36.3 C)-97.8 F (36.6 C)] 97.8 F (36.6 C) (02/18 0503) Pulse Rate:  [76-96] 76  (02/18 0503) Resp:  [18-20] 20  (02/18 0503) BP: (108-135)/(62-97) 132/73 mmHg (02/18 0503) SpO2:  [93 %-98 %] 98 % (02/18 0503) Weight change:     Intake/Output from previous day: 02/17 0701 - 02/18 0700 In: 1040 [P.O.:1040] Out: 6150 [Urine:6150] Intake/Output this shift: Total I/O In: 240 [P.O.:240] Out: 2900 [Urine:2900]  General appearance: alert and cooperative Resp: clear to auscultation bilaterally Cardio: regular rate and rhythm, S1, S2 normal, no murmur, click, rub or gallop GI: soft, non-tender; bowel sounds normal; no masses,  no organomegaly Extremities: edema 2+ edema  Lab Results:  [facility] 06/01/11 0535 05/31/11 1826  WBC 15.9* 22.1*  HGB 12.6* 14.5  HCT 36.5* 42.5  PLT 271 329   BMET  Basename 06/01/11 0535 05/31/11 2108  NA 134* 130*  K 3.9 4.6  CL 98 86*  CO2 22 18*  GLUCOSE 178* 127*  BUN 122* 155*  CREATININE 6.84* 14.19*  CALCIUM 8.7 9.2    Studies/Results: Ct Abdomen Pelvis Wo Contrast  06/01/2011  *RADIOLOGY REPORT*  Clinical Data: Bilateral upper abdominal pain earlier today.  CT ABDOMEN AND PELVIS WITHOUT CONTRAST  Technique:  Multidetector CT imaging of the abdomen and pelvis was performed following the standard protocol without intravenous contrast.  Comparison: CT urogram dated 02/11/2007.  Findings: 1.2 cm gallstone in the gallbladder.  No gallbladder wall thickening or pericholecystic fluid.  Interval mild to moderate dilatation of both renal collecting systems with significant soft tissue stranding around both renal collecting systems and extending inferiorly along the proximal ureters.  No significant ureteral dilatation.  No urinary tract calculi visualized.  Foley catheter in the urinary bladder.  No significant urine in the bladder.  There is some air in the  bladder.  Mildly enlarged prostate gland with calcifications.  Multiple colonic diverticula without evidence of diverticulitis.  No enlarged lymph nodes.  Unremarkable noncontrasted appearance of the liver, spleen, pancreas and adrenal glands.  Clear lung bases.  Lumbar and lower thoracic spine degenerative changes.  IMPRESSION:  1.  Interval mild to moderate bilateral hydronephrosis and extensive soft tissue stranding around both renal collecting systems and extending along the proximal ureters.  No obstructing calculi seen.  Although soft tissue stranding can be seen in association with obstruction, this is concerning for the possibility of bilateral collecting system infection. 2.  Cholelithiasis without evidence of cholecystitis.  Original Report Authenticated By: Darrol Angel, M.D.   US Renal  06/02/2011  *RADIOLOGY REPORT*  Clinical Data:  Sudden onset of acute renal failure.  RENAL/URINARY TRACT ULTRASOUND COMPLETE  Comparison:  CT of the abdomen and pelvis performed earlier today at 05:40 p.m.  Findings:  Right Kidney:  The right kidney measures 15.3 cm in length.  The kidney demonstrates grossly normal echogenicity and configuration. No significant cortical thinning is seen.  No masses are identified; known small renal cysts are not well characterized. Mild hydronephrosis, as characterized on the recent CT of the abdomen and pelvis, is not well seen on ultrasound; this is thought to reflect complex fluid within the right renal collecting system.  Left Kidney:  The left kidney measures 16.0 cm in length.  The kidney demonstrates grossly normal echogenicity and configuration. No significant cortical thinning is seen.  No masses are seen. Mild hydronephrosis, as characterized on the recent CT of the abdomen  and pelvis, is not well seen on ultrasound; this is thought to reflect complex fluid within the left renal collecting system.  Bladder:  The bladder is decompressed, with a Foley catheter in place.  A  stone is seen layering dependently within the gallbladder; the gallbladder is otherwise grossly unremarkable in appearance.  IMPRESSION:  1.  Bilateral nephromegaly; mild hydronephrosis, as characterized on the recent CT of the abdomen and pelvis, is not well seen on ultrasound.  This is thought to reflect complex fluid within the renal collecting systems bilaterally.  On correlation with the prior CT, the appearance is highly suspicious for severe bilateral ureteritis and likely mild bilateral pyelonephritis; this likely explains the patient's renal failure.  No definite obstructing stones are seen. 2.  Cholelithiasis noted; gallbladder otherwise unremarkable in appearance.  These results were called by telephone on 06/01/2011  at  02:13 a.m. to  [facility] RN, who verbally acknowledged these results.  Original Report Authenticated By: Tonia Ghent, M.D.   Dg Chest Portable 1 View  05/31/2011  *RADIOLOGY REPORT*  Clinical Data: Shortness of breath and weakness for 2 days. History of bronchitis, hypertension, and diabetes.  Ex-smoker.  PORTABLE CHEST - 1 VIEW  Comparison: 05/16/2011  Findings: Mildly degraded exam due to AP portable technique and patient body habitus.  Patient rotated left. Cardiomegaly accentuated by AP portable technique.  Cannot exclude small bilateral pleural effusions. No pneumothorax.  Low lung volumes with resultant pulmonary interstitial prominence.  Patchy left greater right bibasilar airspace disease.  IMPRESSION: Cardiomegaly and low lung volumes.  Bibasilar airspace disease.  Although this could represent atelectasis and be secondary to low lung volumes/AP portable technique, infection and small pleural effusions.  cannot be excluded.  Consider further evaluation with PA and lateral radiographs.  Original Report Authenticated By: Consuello Bossier, M.D.    Medications: I have reviewed the patient's current medications.  Assessment/Plan: Principal Problem:  *ARF (acute renal failure)   Much improved with foley in place. Post obstructive diuresis, continue some IVFs Active Problems:  Bladder outlet obstruction  Foley at discharge, flomax started  ?UTI  Appearance of pyelonephritis on CT but urine culture negative, on Rocephin  HTN (hypertension) ok  DM type 2 (diabetes mellitus, type 2)  Reasonable control on ssi  Disposition.  Ambulate on floor, possible discharge in am   LOS: 2 days   Daronte Shostak JOSEPH 06/02/2011, 6:26 AM

## 2011-06-02 NOTE — Progress Notes (Signed)
06-02-11 UR completed. Christie Viscomi RN BSN  

## 2011-06-03 LAB — DIFFERENTIAL
Basophils Absolute: 0 10*3/uL (ref 0.0–0.1)
Eosinophils Absolute: 0.2 10*3/uL (ref 0.0–0.7)
Lymphocytes Relative: 20 % (ref 12–46)
Lymphs Abs: 1.8 10*3/uL (ref 0.7–4.0)
Neutrophils Relative %: 66 % (ref 43–77)

## 2011-06-03 LAB — BASIC METABOLIC PANEL
Calcium: 8 mg/dL — ABNORMAL LOW (ref 8.4–10.5)
GFR calc non Af Amer: >90 mL/min (ref >90)
Sodium: 143 mEq/L (ref 135–145)

## 2011-06-03 LAB — CBC
MCH: 26.7 pg (ref 26.0–34.0)
Platelets: 219 10*3/uL (ref 150–400)
RBC: 4.24 MIL/uL (ref 4.22–5.81)
RDW: 15.5 % (ref 11.5–15.5)
WBC: 9.2 10*3/uL (ref 4.0–10.5)

## 2011-06-03 MED ORDER — TAMSULOSIN HCL 0.4 MG PO CAPS: 0.4000 mg | ORAL_CAPSULE | Freq: Every day | ORAL | Status: DC

## 2011-06-03 NOTE — Discharge Summary (Signed)
Physician Discharge Summary  Patient ID: Joseph Clay MRN: [id number] DOB/AGE: [birth year] 61 y.o.  Admit date: 05/31/2011 Discharge date: 06/03/2011  Admission Diagnoses: Acute renal failure Bladder obstruction Hypertension Diabetes mellitus type 2 COPD Obstructive sleep apnea  Discharge Diagnoses:  Principal Problem:  *ARF (acute renal failure) Active Problems:  Bladder outlet obstruction  HTN (hypertension)  DM type 2 (diabetes mellitus, type 2) COPD Obstructive sleep apnea   Discharged Condition: good  Hospital Course: The patient was admitted on November 16 complaining feeling tired, and increasing dyspnea on exertion, edema and decreased appetite with mild confusion. In the emergency room he was found to have a BUN of 155 and a creatinine of 14. A Foley catheter was placed and 1/2 L of urine was released. The patient was felt to have a bladder obstruction and was admitted. His creatinine quickly responded to resolution of the outlet obstruction and at discharge creatinine in normal range at 0.87. The patient was seen by urology whose impression was acute renal failure secondary to bladder obstruction, patient started LODOSYN and Foley to be left in place for about a week. The patient did have some postobstructive diuresis as expected. A renal ultrasound showed bilateral nephromegaly, mild hydronephrosis. A CT scan of the abdomen showed mild to moderate bilateral hydronephrosis and extensive soft tissue stranding. The patient was given 2 days of antibiotics but his urine culture was negative and they were discontinued. His blood sugars remained under reasonable control, his amlodipine was continued for hypertension but lisinopril was held.  CODE STATUS: Full code Activity: As tolerated Diet regular diet  Consults: urology  Significant Diagnostic Studies: labs:  and radiology: CT scan: See above and Ultrasound: See above  Treatments: IV hydration, antibiotics: ceftriaxone  and Foley catheter  Discharge Exam: Blood pressure 143/73, pulse 84, temperature 98.4 F (36.9 C), temperature source Oral, resp. rate 20, height 5\' 7"  (1.702 m), weight 137.8 kg (303 lb 12.7 oz), SpO2 90.00%. General appearance: alert and cooperative Resp: clear to auscultation bilaterally Cardio: regular rate and rhythm, S1, S2 normal, no murmur, click, rub or gallop Extremities: edema 1+  Disposition: 01-Home or Self Care   Medication List  As of 06/03/2011  7:33 AM   STOP taking these medications         lisinopril 20 MG tablet      oxyCODONE-acetaminophen 5-325 MG per tablet         TAKE these medications         amLODipine 5 MG tablet   Commonly known as: NORVASC   Take 5 mg by mouth daily.      aspirin 81 MG chewable tablet   Chew 81 mg by mouth daily.      calcium carbonate 600 MG Tabs   Commonly known as: OS-CAL   Take 600 mg by mouth daily.      metFORMIN 500 MG tablet   Commonly known as: GLUCOPHAGE   Take 500 mg by mouth 2 (two) times daily with a meal.      pravastatin 40 MG tablet   Commonly known as: PRAVACHOL   Take 40 mg by mouth every evening.      Tamsulosin HCl 0.4 MG Caps   Commonly known as: FLOMAX   Take 1 capsule (0.4 mg total) by mouth daily after supper.           Follow-up Information    Follow up with [name], MD in 10 days.         Signed:  Dylann Gallier JOSEPH 06/03/2011, 7:33 AM

## 2011-06-03 NOTE — Discharge Instructions (Signed)
Foley in place as per urology. Drink whenever you are thirsty, add some sodium to your food.

## 2011-06-11 ENCOUNTER — Other Ambulatory Visit: Payer: Self-pay | Admitting: Urology

## 2011-06-11 ENCOUNTER — Encounter (HOSPITAL_COMMUNITY): Payer: Self-pay | Admitting: *Deleted

## 2011-06-13 ENCOUNTER — Encounter (HOSPITAL_COMMUNITY): Payer: Self-pay | Admitting: *Deleted

## 2011-06-16 ENCOUNTER — Encounter (HOSPITAL_COMMUNITY): Payer: Self-pay | Admitting: Anesthesiology

## 2011-06-16 ENCOUNTER — Ambulatory Visit (HOSPITAL_COMMUNITY)
Admission: RE | Admit: 2011-06-16 | Discharge: 2011-06-17 | Disposition: A | Discharge status: Home / Self Care | Payer: Managed Care, Other (non HMO) | Source: Ambulatory Visit | Attending: Urology | Admitting: Urology

## 2011-06-16 ENCOUNTER — Encounter (HOSPITAL_COMMUNITY): Payer: Self-pay | Admitting: *Deleted

## 2011-06-16 ENCOUNTER — Encounter (HOSPITAL_COMMUNITY)
Admission: RE | Disposition: A | Discharge status: Home / Self Care | Payer: Self-pay | Source: Ambulatory Visit | Attending: Urology

## 2011-06-16 ENCOUNTER — Ambulatory Visit (HOSPITAL_COMMUNITY): Payer: Managed Care, Other (non HMO) | Admitting: Anesthesiology

## 2011-06-16 DIAGNOSIS — R339 Retention of urine, unspecified: Secondary | ICD-10-CM

## 2011-06-16 DIAGNOSIS — N138 Other obstructive and reflux uropathy: Secondary | ICD-10-CM | POA: Insufficient documentation

## 2011-06-16 DIAGNOSIS — Z79899 Other long term (current) drug therapy: Secondary | ICD-10-CM | POA: Insufficient documentation

## 2011-06-16 DIAGNOSIS — I10 Essential (primary) hypertension: Secondary | ICD-10-CM | POA: Insufficient documentation

## 2011-06-16 DIAGNOSIS — N401 Enlarged prostate with lower urinary tract symptoms: Secondary | ICD-10-CM | POA: Insufficient documentation

## 2011-06-16 DIAGNOSIS — N1330 Unspecified hydronephrosis: Secondary | ICD-10-CM | POA: Insufficient documentation

## 2011-06-16 DIAGNOSIS — N320 Bladder-neck obstruction: Secondary | ICD-10-CM | POA: Insufficient documentation

## 2011-06-16 DIAGNOSIS — E119 Type 2 diabetes mellitus without complications: Secondary | ICD-10-CM | POA: Insufficient documentation

## 2011-06-16 DIAGNOSIS — Z7982 Long term (current) use of aspirin: Secondary | ICD-10-CM | POA: Insufficient documentation

## 2011-06-16 HISTORY — DX: Chronic kidney disease, unspecified: N18.9

## 2011-06-16 HISTORY — PX: TRANSURETHRAL RESECTION OF PROSTATE: SHX73

## 2011-06-16 HISTORY — PX: CYSTOSCOPY: SHX5120

## 2011-06-16 LAB — BASIC METABOLIC PANEL
BUN: 13 mg/dL (ref 6–23)
CO2: 31 mEq/L (ref 19–32)
CO2: 33 mEq/L — ABNORMAL HIGH (ref 19–32)
Calcium: 9.2 mg/dL (ref 8.4–10.5)
Chloride: 101 mEq/L (ref 96–112)
GFR calc Af Amer: >90 mL/min (ref >90)
GFR calc Af Amer: >90 mL/min (ref >90)
Glucose, Bld: 137 mg/dL — ABNORMAL HIGH (ref 70–99)
Potassium: 3.8 mEq/L (ref 3.5–5.1)
Sodium: 139 mEq/L (ref 135–145)

## 2011-06-16 LAB — GLUCOSE, CAPILLARY: Glucose-Capillary: 140 mg/dL — ABNORMAL HIGH (ref 70–99)

## 2011-06-16 SURGERY — CYSTOSCOPY
Anesthesia: General

## 2011-06-16 MED ORDER — FENTANYL CITRATE 0.05 MG/ML IJ SOLN
INTRAMUSCULAR | Status: DC | PRN
  Administered 2011-06-16 (×4): 50 ug via INTRAVENOUS

## 2011-06-16 MED ORDER — LACTATED RINGERS IV SOLN: INTRAVENOUS | Status: DC

## 2011-06-16 MED ORDER — MIDAZOLAM HCL 5 MG/5ML IJ SOLN
INTRAMUSCULAR | Status: DC | PRN
  Administered 2011-06-16: 1 mg via INTRAVENOUS

## 2011-06-16 MED ORDER — DOCUSATE SODIUM 100 MG PO CAPS
100.0000 mg | ORAL_CAPSULE | Freq: Two times a day (BID) | ORAL | Status: DC
  Administered 2011-06-16 – 2011-06-17 (×3): 100 mg via ORAL
  Filled 2011-06-16 (×5): qty 1

## 2011-06-16 MED ORDER — SODIUM CHLORIDE 0.9 % IV SOLN: 250.0000 mL | INTRAVENOUS | Status: DC

## 2011-06-16 MED ORDER — FENTANYL CITRATE 0.05 MG/ML IJ SOLN
25.0000 ug | INTRAMUSCULAR | Status: DC | PRN
  Administered 2011-06-16 (×2): 50 ug via INTRAVENOUS

## 2011-06-16 MED ORDER — PROPOFOL 10 MG/ML IV EMUL
INTRAVENOUS | Status: DC | PRN
  Administered 2011-06-16: 250 mg via INTRAVENOUS

## 2011-06-16 MED ORDER — AMLODIPINE BESYLATE 5 MG PO TABS
5.0000 mg | ORAL_TABLET | Freq: Every day | ORAL | Status: DC
  Administered 2011-06-17: 5 mg via ORAL
  Filled 2011-06-16 (×2): qty 1

## 2011-06-16 MED ORDER — SIMVASTATIN 20 MG PO TABS
20.0000 mg | ORAL_TABLET | Freq: Every day | ORAL | Status: DC
  Administered 2011-06-16: 20 mg via ORAL
  Filled 2011-06-16 (×2): qty 1

## 2011-06-16 MED ORDER — SODIUM CHLORIDE 0.9 % IJ SOLN: 3.0000 mL | INTRAMUSCULAR | Status: DC | PRN

## 2011-06-16 MED ORDER — CIPROFLOXACIN IN D5W 400 MG/200ML IV SOLN
400.0000 mg | Freq: Two times a day (BID) | INTRAVENOUS | Status: AC
  Administered 2011-06-16 – 2011-06-17 (×2): 400 mg via INTRAVENOUS
  Filled 2011-06-16 (×3): qty 200

## 2011-06-16 MED ORDER — LISINOPRIL 20 MG PO TABS
20.0000 mg | ORAL_TABLET | Freq: Every day | ORAL | Status: DC
  Administered 2011-06-17: 20 mg via ORAL
  Filled 2011-06-16 (×2): qty 1

## 2011-06-16 MED ORDER — SODIUM CHLORIDE 0.9 % IR SOLN
Status: DC | PRN
  Administered 2011-06-16: 12000 mL

## 2011-06-16 MED ORDER — DROPERIDOL 2.5 MG/ML IJ SOLN: 0.6250 mg | INTRAMUSCULAR | Status: DC | PRN

## 2011-06-16 MED ORDER — MEPERIDINE HCL 25 MG/ML IJ SOLN: 6.2500 mg | INTRAMUSCULAR | Status: DC | PRN

## 2011-06-16 MED ORDER — CIPROFLOXACIN IN D5W 400 MG/200ML IV SOLN
INTRAVENOUS | Status: AC
  Filled 2011-06-16: qty 200

## 2011-06-16 MED ORDER — SODIUM CHLORIDE 0.9 % IJ SOLN: 3.0000 mL | Freq: Two times a day (BID) | INTRAMUSCULAR | Status: DC

## 2011-06-16 MED ORDER — HYDROCODONE-ACETAMINOPHEN 5-325 MG PO TABS: 1.0000 | ORAL_TABLET | ORAL | Status: DC | PRN

## 2011-06-16 MED ORDER — CIPROFLOXACIN IN D5W 400 MG/200ML IV SOLN
400.0000 mg | INTRAVENOUS | Status: AC
  Administered 2011-06-16: 400 mg via INTRAVENOUS

## 2011-06-16 MED ORDER — ACETAMINOPHEN 10 MG/ML IV SOLN
INTRAVENOUS | Status: DC | PRN
  Administered 2011-06-16: 1000 mg via INTRAVENOUS

## 2011-06-16 MED ORDER — MUPIROCIN 2 % EX OINT
TOPICAL_OINTMENT | CUTANEOUS | Status: AC
  Administered 2011-06-16: 09:00:00
  Filled 2011-06-16: qty 22

## 2011-06-16 MED ORDER — CALCIUM CARBONATE 1250 (500 CA) MG PO TABS
1250.0000 mg | ORAL_TABLET | Freq: Every day | ORAL | Status: DC
  Administered 2011-06-17: 1250 mg via ORAL
  Filled 2011-06-16 (×2): qty 1

## 2011-06-16 MED ORDER — ONDANSETRON HCL 4 MG/2ML IJ SOLN
INTRAMUSCULAR | Status: DC | PRN
  Administered 2011-06-16: 4 mg via INTRAVENOUS

## 2011-06-16 MED ORDER — FUROSEMIDE 40 MG PO TABS
40.0000 mg | ORAL_TABLET | Freq: Two times a day (BID) | ORAL | Status: DC
Start: 2011-06-16 — End: 2011-06-17
  Administered 2011-06-16 – 2011-06-17 (×3): 40 mg via ORAL
  Filled 2011-06-16 (×4): qty 1

## 2011-06-16 MED ORDER — SODIUM CHLORIDE 0.9 % IR SOLN
3000.0000 mL | Status: DC
  Administered 2011-06-16: 3000 mL

## 2011-06-16 MED ORDER — DIPHENHYDRAMINE HCL 12.5 MG/5ML PO ELIX: 12.5000 mg | ORAL_SOLUTION | Freq: Four times a day (QID) | ORAL | Status: DC | PRN

## 2011-06-16 MED ORDER — CIPROFLOXACIN HCL 500 MG PO TABS: 500.0000 mg | ORAL_TABLET | Freq: Two times a day (BID) | ORAL | Status: AC

## 2011-06-16 MED ORDER — DOCUSATE SODIUM 100 MG PO CAPS: 100.0000 mg | ORAL_CAPSULE | Freq: Two times a day (BID) | ORAL | Status: AC

## 2011-06-16 MED ORDER — DIPHENHYDRAMINE HCL 50 MG/ML IJ SOLN: 12.5000 mg | Freq: Four times a day (QID) | INTRAMUSCULAR | Status: DC | PRN

## 2011-06-16 MED ORDER — ACETAMINOPHEN 10 MG/ML IV SOLN
INTRAVENOUS | Status: AC
  Filled 2011-06-16: qty 100

## 2011-06-16 MED ORDER — POTASSIUM CHLORIDE CRYS ER 20 MEQ PO TBCR
20.0000 meq | EXTENDED_RELEASE_TABLET | Freq: Two times a day (BID) | ORAL | Status: DC
  Administered 2011-06-16 – 2011-06-17 (×3): 20 meq via ORAL
  Filled 2011-06-16 (×4): qty 1

## 2011-06-16 MED ORDER — SODIUM CHLORIDE 0.45 % IV SOLN
INTRAVENOUS | Status: DC
  Administered 2011-06-16 – 2011-06-17 (×2): via INTRAVENOUS

## 2011-06-16 MED ORDER — LACTATED RINGERS IV SOLN
INTRAVENOUS | Status: DC | PRN
  Administered 2011-06-16: 10:00:00 via INTRAVENOUS

## 2011-06-16 MED ORDER — ZOLPIDEM TARTRATE 5 MG PO TABS: 5.0000 mg | ORAL_TABLET | Freq: Every evening | ORAL | Status: DC | PRN

## 2011-06-16 MED ORDER — CALCIUM CARBONATE 600 MG PO TABS: 600.0000 mg | ORAL_TABLET | Freq: Every day | ORAL | Status: DC

## 2011-06-16 MED ORDER — FENTANYL CITRATE 0.05 MG/ML IJ SOLN
INTRAMUSCULAR | Status: AC
  Filled 2011-06-16: qty 2

## 2011-06-16 MED ORDER — LIDOCAINE HCL (CARDIAC) 20 MG/ML IV SOLN
INTRAVENOUS | Status: DC | PRN
  Administered 2011-06-16: 100 mg via INTRAVENOUS

## 2011-06-16 MED ORDER — HYDROCODONE-ACETAMINOPHEN 5-300 MG PO TABS: 1.0000 | ORAL_TABLET | Freq: Four times a day (QID) | ORAL | Status: DC

## 2011-06-16 SURGICAL SUPPLY — 22 items
BAG URINE DRAINAGE (UROLOGICAL SUPPLIES) ×2 IMPLANT
BAG URO CATCHER STRL LF (DRAPE) ×2 IMPLANT
BLADE SURG 15 STRL LF DISP TIS (BLADE) IMPLANT
BLADE SURG 15 STRL SS (BLADE)
CATH FOLEY 3WAY 30CC 22FR (CATHETERS) IMPLANT
CATH HEMA 3WAY 30CC 22FR COUDE (CATHETERS) ×2 IMPLANT
CLOTH BEACON ORANGE TIMEOUT ST (SAFETY) ×2 IMPLANT
DRAPE CAMERA CLOSED 9X96 (DRAPES) ×2 IMPLANT
ELECT LOOP MED HF 24F 12D CBL (CLIP) ×2 IMPLANT
ELECT REM PT RETURN 9FT ADLT (ELECTROSURGICAL) ×2
ELECTRODE REM PT RTRN 9FT ADLT (ELECTROSURGICAL) ×1 IMPLANT
EVACUATOR MICROVAS BLADDER (UROLOGICAL SUPPLIES) IMPLANT
GLOVE BIOGEL M STRL SZ7.5 (GLOVE) ×2 IMPLANT
GOWN STRL NON-REIN LRG LVL3 (GOWN DISPOSABLE) ×4 IMPLANT
HOLDER FOLEY CATH W/STRAP (MISCELLANEOUS) IMPLANT
KIT ASPIRATION TUBING (SET/KITS/TRAYS/PACK) ×2 IMPLANT
LOOPS RESECTOSCOPE DISP (ELECTROSURGICAL) IMPLANT
MANIFOLD NEPTUNE II (INSTRUMENTS) ×2 IMPLANT
PACK CYSTO (CUSTOM PROCEDURE TRAY) ×2 IMPLANT
SUT ETHILON 3 0 PS 1 (SUTURE) IMPLANT
SYR 30ML LL (SYRINGE) IMPLANT
TUBING CONNECTING 10 (TUBING) ×2 IMPLANT

## 2011-06-16 NOTE — Transfer of Care (Signed)
Immediate Anesthesia Transfer of Care Note  Patient: Joseph Clay  Procedure(s) Performed: Procedure(s) (LRB): CYSTOSCOPY (N/A) TRANSURETHRAL RESECTION OF THE PROSTATE (TURP) (N/A)  Patient Location: PACU  Anesthesia Type: General  Level of Consciousness: awake, alert  and oriented  Airway & Oxygen Therapy: Patient Spontanous Breathing and Patient connected to face mask oxygen  Post-op Assessment: Report given to PACU RN and Post -op Vital signs reviewed and stable  Post vital signs: Reviewed and stable  Complications: No apparent anesthesia complications

## 2011-06-16 NOTE — Anesthesia Procedure Notes (Signed)
Procedure Name: LMA Insertion Date/Time: 06/16/2011 10:26 AM Performed by: Lurlean Leyden, Pranav Lince L. Patient Re-evaluated:Patient Re-evaluated prior to inductionOxygen Delivery Method: Circle system utilized Preoxygenation: Pre-oxygenation with 100% oxygen Intubation Type: IV induction LMA: LMA inserted LMA Size: 4.0 Tube type: Oral Number of attempts: 2 Placement Confirmation: breath sounds checked- equal and bilateral and positive ETCO2 Tube secured with: Tape Dental Injury: Teeth and Oropharynx as per pre-operative assessment

## 2011-06-16 NOTE — Progress Notes (Signed)
Patient ID: Joseph Clay, male   DOB: 16-Apr-1950, 61 y.o.   MRN: [id number]  Post-op note  Subjective: The patient is doing well.  No complaints.  Objective: Vital signs in last 24 hours: Temp:  [97.4 F (36.3 C)-98.2 F (36.8 C)] 97.4 F (36.3 C) (03/04 1759) Pulse Rate:  [89-101] 89  (03/04 1759) Resp:  [17-20] 18  (03/04 1759) BP: (119-162)/(75-92) 119/75 mmHg (03/04 1759) SpO2:  [96 %-100 %] 96 % (03/04 1759) Weight:  [132.904 kg (293 lb)] 132.904 kg (293 lb) (03/04 1312)  Intake/Output from previous day:   Intake/Output this shift: Total I/O In: 1115 [P.O.:240; I.V.:875] Out: -2925   Physical Exam:  General: Alert and oriented. Abdomen: Soft, Nondistended. Incisions: Clean and dry.  Lab Results:  Assessment/Plan: POD#0   1) Continue to monitor 2) Titrate CBI down overnight   [name], [name]. MD   LOS: 0 days   [name],LES 06/16/2011, 6:18 PM

## 2011-06-16 NOTE — Anesthesia Postprocedure Evaluation (Signed)
  Anesthesia Post-op Note  Patient: Joseph Clay  Procedure(s) Performed: Procedure(s) (LRB): CYSTOSCOPY (N/A) TRANSURETHRAL RESECTION OF THE PROSTATE (TURP) (N/A)  Patient Location: PACU  Anesthesia Type: General  Level of Consciousness: awake and alert   Airway and Oxygen Therapy: Patient Spontanous Breathing  Post-op Pain: mild  Post-op Assessment: Post-op Vital signs reviewed, Patient's Cardiovascular Status Stable, Respiratory Function Stable, Patent Airway and No signs of Nausea or vomiting  Post-op Vital Signs: stable  Complications: No apparent anesthesia complications

## 2011-06-16 NOTE — Preoperative (Signed)
Beta Blockers   Reason not to administer Beta Blockers:Not Applicable 

## 2011-06-16 NOTE — Discharge Instructions (Signed)
1. You may see some blood in the urine and may have some burning with urination for 48-72 hours. You also may notice that you have to urinate more frequently or urgently after your procedure which is normal.  2. You should call should you develop an inability urinate, fever > 101, persistent nausea and vomiting that prevents you from eating or drinking to stay hydrated.  3. If you have a catheter, you will be taught how to take care of the catheter by the nursing staff prior to discharge from the hospital.  You may periodically feel a strong urge to void with the catheter in place.  This is a bladder spasm and most often can occur when having a bowel movement or moving around. It is typically self-limited and usually will stop after a few minutes.  You may use some Vaseline or Neosporin around the tip of the catheter to reduce friction at the tip of the penis. You may also see some blood in the urine.  A very small amount of blood can make the urine look quite red.  As long as the catheter is draining well, there usually is not a problem.  However, if the catheter is not draining well and is bloody, you should call the office [phone]) to notify us.   Hold your aspirin for 10 days and restart at that time if urine clear of any blood.  You do not need to take tamsulosin (Flomax) anymore.

## 2011-06-16 NOTE — Op Note (Signed)
Preoperative diagnosis: 1. Urinary retention / Bladder outlet obstruction secondary to BPH  Postoperative diagnosis:  1. Urinary retention / Bladder outlet obstruction secondary to BPH  Procedure:  1. Cystoscopy 2. Transurethral resection of the prostate  Surgeon: Moody Bruins. M.D.  Anesthesia: General  Complications: None  EBL: Minimal  Specimens: 1. Prostate Chips  Disposition of specimens: Pathology  Indication: Joseph Clay is a patient with urinary retention /  bladder outlet obstruction secondary to benign prostatic hyperplasia. After reviewing the management options for treatment, he elected to proceed with the above surgical procedure(s). We have discussed the potential benefits and risks of the procedure, side effects of the proposed treatment, the likelihood of the patient achieving the goals of the procedure, and any potential problems that might occur during the procedure or recuperation. Informed consent has been obtained.  Description of procedure:  The patient was taken to the operating room and general anesthesia was induced.  The patient was placed in the dorsal lithotomy position, prepped and draped in the usual sterile fashion, and preoperative antibiotics were administered. A preoperative time-out was performed.   Cystourethroscopy was performed.  The patient's urethra was examined and was normal anteriorly.  He had a short prostate (< 3 cm) but a very high bladder neck and large median lobe.   The bladder was then systematically examined in its entirety. There was no evidence of any bladder tumors, stones, or other mucosal pathology.  The ureteral orifices were identified and marked so as to be avoided during the procedure.  The prostate adenoma was then resected utilizing loop cautery resection with the monopolar/bipolar cutting loop.  The prostate adenoma from the bladder neck back to the verumontanum was resected beginning at the six o'clock  position and then extended to include the right and left lobes of the prostate and anterior prostate. Care was taken not to resect distal to the verumontanum.  Hemostasis was then achieved with the cautery and the bladder was emptied and reinspected with no significant bleeding noted at the end of the procedure.    A 3 way catheter was then placed into the bladder and placed on continuous bladder irrigation.  The patient appeared to tolerate the procedure well and without complications.  The patient was able to be awakened and transferred to the recovery unit in satisfactory condition.

## 2011-06-16 NOTE — Interval H&P Note (Signed)
History and Physical Interval Note:  06/16/2011 10:07 AM  Joseph Clay  has presented today for surgery, with the diagnosis of Urinary Retention  The various methods of treatment have been discussed with the patient and family. After consideration of risks, benefits and other options for treatment, the patient has consented to  Procedure(s) (LRB): CYSTOSCOPY (N/A) TRANSURETHRAL RESECTION OF THE PROSTATE (TURP) (N/A) as a surgical intervention .  The patients' history has been reviewed, patient examined, no change in status, stable for surgery.   Questions were answered to the patient's satisfaction.     Crysta Gulick,LES

## 2011-06-16 NOTE — Anesthesia Preprocedure Evaluation (Signed)
Anesthesia Evaluation  Patient identified by MRN, date of birth, ID band Patient awake    Reviewed: Allergy & Precautions, H&P , NPO status , Patient's Chart, lab work & pertinent test results, reviewed documented beta blocker date and time   Airway Mallampati: II TM Distance: >3 FB Neck ROM: full    Dental No notable dental hx. (+) Dental Advisory Given, Teeth Intact and Caps   Pulmonary sleep apnea and Continuous Positive Airway Pressure Ventilation , former smoker breath sounds clear to auscultation  Pulmonary exam normal       Cardiovascular hypertension, Pt. on medications Rhythm:Regular Rate:Normal     Neuro/Psych    GI/Hepatic   Endo/Other  Diabetes mellitus-, Well Controlled, Type 2, Oral Hypoglycemic AgentsMorbid obesity  Renal/GU      Musculoskeletal  (+) Arthritis -,   Abdominal   Peds  Hematology   Anesthesia Other Findings Caps front top  Reproductive/Obstetrics                           Anesthesia Physical  Anesthesia Plan  ASA: III  Anesthesia Plan: General   Post-op Pain Management: MAC Combined w/ Regional for Post-op pain   Induction: Intravenous  Airway Management Planned:   Additional Equipment:   Intra-op Plan:   Post-operative Plan:   Informed Consent: I have reviewed the patients History and Physical, chart, labs and discussed the procedure including the risks, benefits and alternatives for the proposed anesthesia with the patient or authorized representative who has indicated his/her understanding and acceptance.   Dental advisory given  Plan Discussed with: CRNA and Surgeon  Anesthesia Plan Comments:         Anesthesia Quick Evaluation

## 2011-06-16 NOTE — H&P (Signed)
History of Present Illness  Joseph Clay is a 61 year old with the following urologic history:  1) Hematuria: He has a history of gross hematuria and has been evaluated in the past by Dr. Wanda Plump including an unenhanced CT scan in October 2008 which was unremarkable.  He also underwent cystoscopy by Dr. Wanda Plump at that time which was negative except for large prostatic vessels indicating this as the most likely source of his hematuria. He has not undergone a contrasted upper tract study. He does have a history of smoking and smoked 1-2 packs for 20 years before quitting around 1990. He has refused contasted imaging of his upper urinary tract.  2) Prostate cancer screening: He receives screening through Dr. Kirby Funk.  3) BPH / urinary retention: He developed postoperative urinary retention in February 2013 after shoulder surgery.  He developed acute renal failure due to retention with resolution after catheter placement.  Interval history:  He follows up today for a voiding trial after having developed postoperative urinary retention. He has no significant complaints.   Past Medical History Problems  1. History of  Hypertension 401.9  Current Meds 1. AmLODIPine Besylate 5 MG Oral Tablet; Therapy: 06Feb2013 to 2. Aspirin 81 MG Oral Tablet; Therapy: (Recorded:23Oct2008) to 3. Calcium + D TABS; Therapy: (Recorded:21Feb2013) to 4. MetFORMIN HCl 500 MG Oral Tablet; Therapy: 06Feb2013 to 5. Pravastatin Sodium 40 MG Oral Tablet; Therapy: 12Nov2012 to 6. Tamsulosin HCl 0.4 MG Oral Capsule; Therapy: 19Feb2013 to  Allergies Medication  1. No Known Drug Allergies  Family History Problems  1. Maternal history of  Heart Disease V17.49  Social History Problems  1. Marital History - Single 2. Occupation: Airline pilot 3. Tobacco Use V15.82 Smoked for 20 yrs and quit 20 yrs ago  Vitals Vital Signs [Data Includes: Last 1 Day]  21Feb2013 08:23AM  BMI Calculated: 38.63 BSA  Calculated: 2.59 Height: 6 ft 2 in Weight: 301 lb  Blood Pressure: 144 / 86 Heart Rate: 108  Physical Exam Constitutional: Well nourished and well developed . No acute distress.  ENT:. The ears and nose are normal in appearance.  Neck: The appearance of the neck is normal and no neck mass is present.  Pulmonary: No respiratory distress and normal respiratory rhythm and effort.  Cardiovascular: Heart rate and rhythm are normal . No peripheral edema.  Genitourinary: Examination of the penis demonstrates an indwelling catheter, but no lesions and a normal meatus.    Procedure     His bladder was filled with 300 cc of sterile saline initially. He had very little sensation of void. His catheter was removed and he was unable to void. For further evaluation, I recommended proceeding with flexible cystoscopy. His genitalia was prepped and draped in the usual sterile fashion the flexible 16 French cystoscope was inserted into the urethra. He had a normal anterior urethra the prostatic urethra demonstrated bilobar hypertrophy left greater than right consistent with benign prostatic hyperplasia and probable bladder outlet obstruction. His bladder was then systematically examined and the ureteral orifices were in their expected anatomic position. There were no bladder tumors or stones. He did have expected mucosal edema and inflammation related to his indwelling catheter. At this point, his bladder had been filled with an additional 200 cc of fluid. He now had over 500 cc but still was unable to void and did not have much of a sensation of void.     Assessment Assessed  1. Acute Urinary Retention 788.20 2. Hydronephrosis 591  Plan Acute Urinary  Retention (788.20)  1. Cysto  Requested for: 26Feb2013 2. URINE CULTURE  Done: 26Feb2013 3. Follow-up Schedule Surgery Office  Follow-up  Done: 26Feb2013  Discussion/Summary 1. Urinary retention likely secondary to BPH: He has now failed a voiding trial  despite alpha-blocker therapy. We discussed options which would include continued alpha-blocker therapy and catheter drainage with another voiding trial in one week versus consideration of a transurethral resection of the prostate. Considering his poor bladder sensation, and the fact that he developed acute renal failure with his episode of retention, I do think proceeding with definitive surgical therapy at this time would be very reasonable and appropriate. After reviewing options, he does wish to proceed with a TURP. We have reviewed the potential risks and complications including but not limited to bleeding, infection, incontinence, erectile dysfunction, persistent urinary retention, and persistent lower urinary tract symptoms, et Karie Soda. He is agreeable to proceed and this will be scheduled for the near future.  Cc: Dr. Kirby Funk      Signatures Electronically signed by : Heloise Purpura, M.D.; Jun 10 2011  6:01PM

## 2011-06-17 LAB — BASIC METABOLIC PANEL
Calcium: 9.2 mg/dL (ref 8.4–10.5)
Creatinine, Ser: 0.92 mg/dL (ref 0.50–1.35)
GFR calc Af Amer: >90 mL/min (ref >90)
Potassium: 3.7 mEq/L (ref 3.5–5.1)

## 2011-06-17 LAB — GLUCOSE, CAPILLARY: Glucose-Capillary: 159 mg/dL — ABNORMAL HIGH (ref 70–99)

## 2011-06-17 MED ORDER — METFORMIN HCL 500 MG PO TABS
500.0000 mg | ORAL_TABLET | Freq: Every day | ORAL | Status: DC
  Administered 2011-06-17: 500 mg via ORAL
  Filled 2011-06-17: qty 1

## 2011-06-17 MED ORDER — INSULIN ASPART 100 UNIT/ML ~~LOC~~ SOLN
0.0000 [IU] | SUBCUTANEOUS | Status: DC
  Administered 2011-06-17 (×2): 3 [IU] via SUBCUTANEOUS
  Filled 2011-06-17: qty 3

## 2011-06-17 NOTE — Discharge Summary (Signed)
  Date of admission: 06/16/2011  Date of discharge: 06/17/2011  Admission diagnosis: Urinary retention, BPH  Discharge diagnosis: Urinary retention, BPH  Secondary diagnoses: HTN, DM  History and Physical: For full details, please see admission history and physical. Briefly, Joseph Clay is a 61 y.o. year old patient with urinary retention felt to be secondary to BPH. He elected to proceed with a TURP for treatment after failing a voiding trial with alpha blocker therapy.   Hospital Course: He underwent a TURP on 06/16/11 without complications.  He was maintained on CBI which was titrated off on POD #1.  He underwent a voiding trial but could not void.  His catheter was replaced.  Laboratory values: No results found for this basename: HGB:3,HCT:3 in the last 72 hours  Basename 06/17/11 0448 06/16/11 1229  CREATININE 0.92 0.99    Disposition: Home  Discharge instruction: The patient was instructed to be ambulatory but told to refrain from heavy lifting, strenuous activity, or driving.   Discharge medications:  Medication List  As of 06/17/2011  1:35 PM   START taking these medications         ciprofloxacin 500 MG tablet   Commonly known as: CIPRO   Take 1 tablet (500 mg total) by mouth 2 (two) times daily.      docusate sodium 100 MG capsule   Commonly known as: COLACE   Take 1 capsule (100 mg total) by mouth 2 (two) times daily.      Hydrocodone-Acetaminophen 5-300 MG Tabs   Take 1-2 tablets by mouth every 6 (six) hours.         CONTINUE taking these medications         amLODipine 5 MG tablet   Commonly known as: NORVASC      calcium carbonate 600 MG Tabs   Commonly known as: OS-CAL      furosemide 40 MG tablet   Commonly known as: LASIX      lisinopril 20 MG tablet   Commonly known as: PRINIVIL,ZESTRIL      metFORMIN 500 MG tablet   Commonly known as: GLUCOPHAGE      potassium chloride SA 20 MEQ tablet   Commonly known as: K-DUR,KLOR-CON      pravastatin 40  MG tablet   Commonly known as: PRAVACHOL         STOP taking these medications         aspirin 81 MG chewable tablet      Tamsulosin HCl 0.4 MG Caps          Where to get your medications    These are the prescriptions that you need to pick up.   You may get these medications from any pharmacy.         ciprofloxacin 500 MG tablet   docusate sodium 100 MG capsule   Hydrocodone-Acetaminophen 5-300 MG Tabs            Followup:  Follow-up Information    Please follow up. (Will call to schedule voiding trial later this week.  Also keep appt on 07/08/11 at 10:30)

## 2011-06-17 NOTE — Progress Notes (Signed)
Patient ID: Joseph Clay, male   DOB: 09/05/1950, 61 y.o.   MRN: [id number]  1 Day Post-Op Subjective: Pt without complaints.    Objective: Vital signs in last 24 hours: Temp:  [97.4 F (36.3 C)-99 F (37.2 C)] 99 F (37.2 C) (03/05 0543) Pulse Rate:  [80-101] 82  (03/05 0543) Resp:  [16-20] 18  (03/05 0543) BP: (112-162)/(70-92) 112/72 mmHg (03/05 0543) SpO2:  [91 %-100 %] 94 % (03/05 0543) Weight:  [132.904 kg (293 lb)] 132.904 kg (293 lb) (03/04 1312)  Intake/Output from previous day: 03/04 0701 - 03/05 0700 In: 1915 [P.O.:240; I.V.:1475; IV Piggyback:200] Out: -6425  Intake/Output this shift: Total I/O In: 500 [I.V.:300; IV Piggyback:200] Out: -1900   Physical Exam:  General: Alert and oriented Abdomen: Soft, ND GU: Urine clear on minimal CBI  Lab Results: BMET  Basename 06/17/11 0448 06/16/11 1229  NA 135 139  K 3.7 3.8  CL 96 101  CO2 33* 33*  GLUCOSE 174* 137*  BUN 10 13  CREATININE 0.92 0.99  CALCIUM 9.2 9.0     Studies/Results: No results found.  Assessment/Plan: -- CBI turned off this morning.  If remains relatively clear over next 30 minutes, will D/C foley and begin voiding trial. Likely D/C home later today after voiding trial complete.   LOS: 1 day   Geneen Dieter,LES 06/17/2011, 6:49 AM

## 2011-06-17 NOTE — Progress Notes (Signed)
Patient ID: Joseph Clay, male   DOB: 01-13-1951, 61 y.o.   MRN: [id number]  Pt unable to void after catheter removed.  He did have some bleeding after catheter removed. He developed strong urge to void but could not.  18 Fr catheter placed with relief of pressure.  Multiple clot hand irrigated.  Urine now clear.  Will d/c home with catheter and plan for outpatient voiding trial.

## 2011-07-09 ENCOUNTER — Encounter (HOSPITAL_COMMUNITY): Payer: Self-pay | Admitting: Urology

## 2012-04-21 ENCOUNTER — Other Ambulatory Visit: Payer: Self-pay | Admitting: Gastroenterology

## 2013-03-17 IMAGING — CR DG CHEST 2V
2 series · 2 of 2 positions shown · non-contrast
Comparison: February 27, 2011

CLINICAL DATA: Preoperative respiratory exam; right shoulder
arthroscopic surgery for subacromial decompression; diabetes and
hypertension

CHEST - 2 VIEW

[view not recorded (1 of 2)]
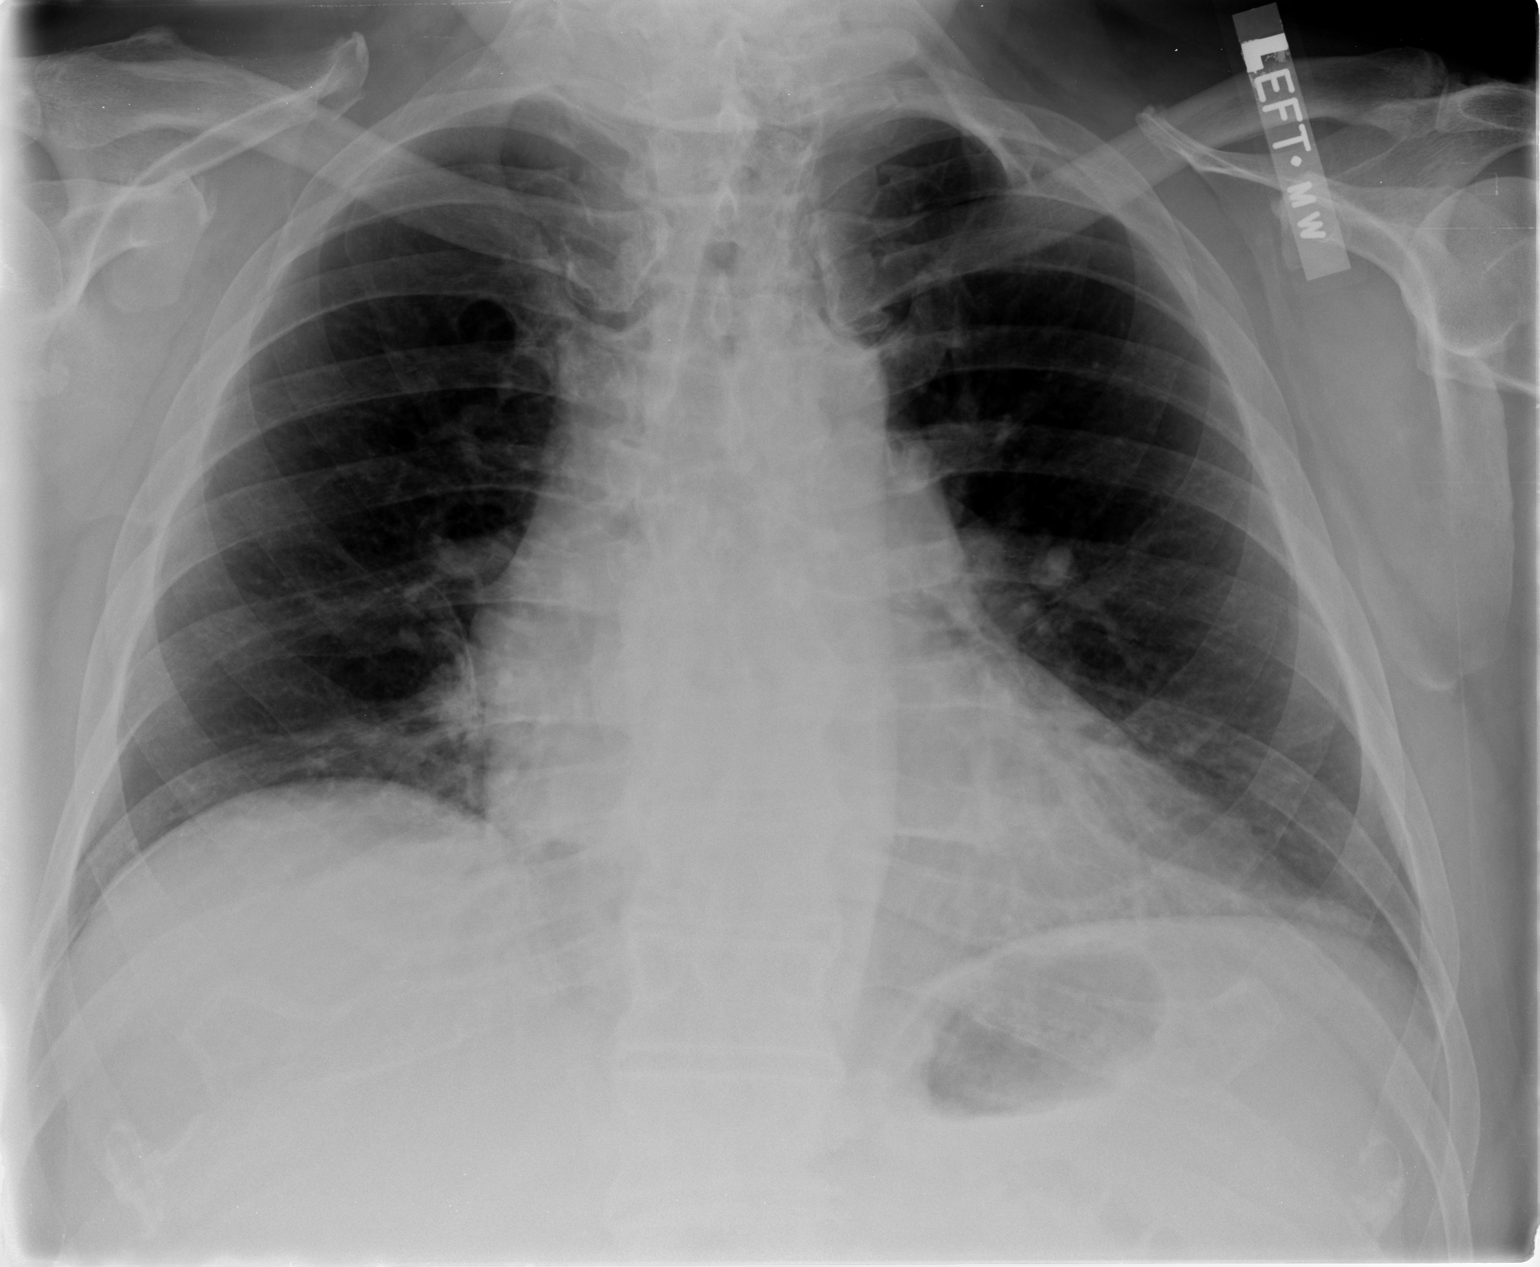

[view not recorded (2 of 2)]
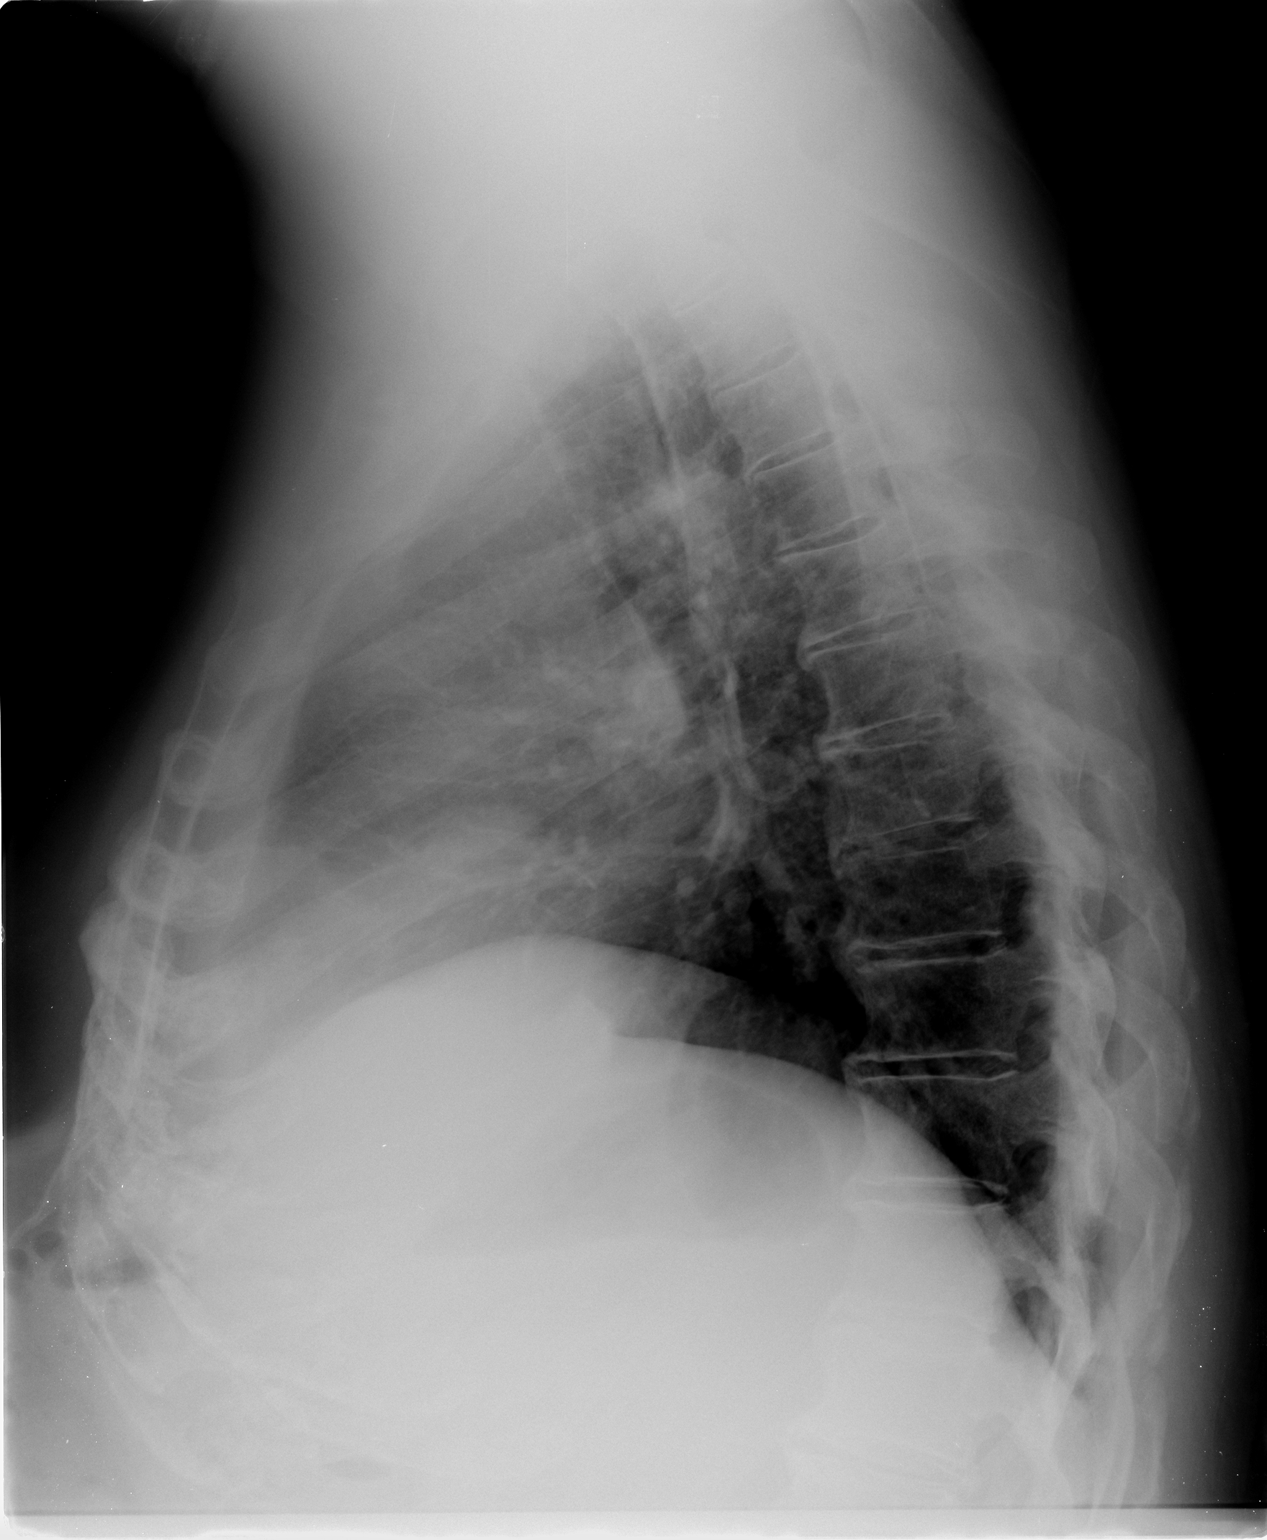

[2 of 2 positions shown; findings below may reference images not displayed]

FINDINGS: Cardiomegaly is unchanged.  The mediastinum and pulmonary
vasculature are within normal limits.  Both lungs are clear.  There
are ankylosing degenerative changes within the mid thoracic spine.
IMPRESSION: Stable chest with mild cardiomegaly.

## 2013-03-25 ENCOUNTER — Encounter (HOSPITAL_COMMUNITY): Payer: Self-pay | Admitting: Emergency Medicine

## 2013-03-25 ENCOUNTER — Emergency Department (HOSPITAL_COMMUNITY): Payer: Managed Care, Other (non HMO)

## 2013-03-25 ENCOUNTER — Emergency Department (HOSPITAL_COMMUNITY)
Admission: EM | Admit: 2013-03-25 | Discharge: 2013-03-25 | Disposition: A | Discharge status: Home / Self Care | Payer: Managed Care, Other (non HMO) | Attending: Emergency Medicine | Admitting: Emergency Medicine

## 2013-03-25 DIAGNOSIS — Z860100 Personal history of colon polyps, unspecified: Secondary | ICD-10-CM | POA: Insufficient documentation

## 2013-03-25 DIAGNOSIS — Z9981 Dependence on supplemental oxygen: Secondary | ICD-10-CM | POA: Insufficient documentation

## 2013-03-25 DIAGNOSIS — G8929 Other chronic pain: Secondary | ICD-10-CM | POA: Insufficient documentation

## 2013-03-25 DIAGNOSIS — R059 Cough, unspecified: Secondary | ICD-10-CM | POA: Insufficient documentation

## 2013-03-25 DIAGNOSIS — I129 Hypertensive chronic kidney disease with stage 1 through stage 4 chronic kidney disease, or unspecified chronic kidney disease: Secondary | ICD-10-CM | POA: Insufficient documentation

## 2013-03-25 DIAGNOSIS — N189 Chronic kidney disease, unspecified: Secondary | ICD-10-CM | POA: Insufficient documentation

## 2013-03-25 DIAGNOSIS — Z8709 Personal history of other diseases of the respiratory system: Secondary | ICD-10-CM | POA: Insufficient documentation

## 2013-03-25 DIAGNOSIS — R509 Fever, unspecified: Secondary | ICD-10-CM

## 2013-03-25 DIAGNOSIS — G4730 Sleep apnea, unspecified: Secondary | ICD-10-CM | POA: Insufficient documentation

## 2013-03-25 DIAGNOSIS — E119 Type 2 diabetes mellitus without complications: Secondary | ICD-10-CM | POA: Insufficient documentation

## 2013-03-25 DIAGNOSIS — E785 Hyperlipidemia, unspecified: Secondary | ICD-10-CM | POA: Insufficient documentation

## 2013-03-25 DIAGNOSIS — IMO0001 Reserved for inherently not codable concepts without codable children: Secondary | ICD-10-CM | POA: Insufficient documentation

## 2013-03-25 DIAGNOSIS — M549 Dorsalgia, unspecified: Secondary | ICD-10-CM | POA: Insufficient documentation

## 2013-03-25 DIAGNOSIS — Z87891 Personal history of nicotine dependence: Secondary | ICD-10-CM | POA: Insufficient documentation

## 2013-03-25 DIAGNOSIS — Z79899 Other long term (current) drug therapy: Secondary | ICD-10-CM | POA: Insufficient documentation

## 2013-03-25 DIAGNOSIS — J029 Acute pharyngitis, unspecified: Secondary | ICD-10-CM | POA: Insufficient documentation

## 2013-03-25 DIAGNOSIS — R05 Cough: Secondary | ICD-10-CM | POA: Insufficient documentation

## 2013-03-25 DIAGNOSIS — M129 Arthropathy, unspecified: Secondary | ICD-10-CM | POA: Insufficient documentation

## 2013-03-25 LAB — CBC WITH DIFFERENTIAL/PLATELET
Eosinophils Absolute: 0.2 10*3/uL (ref 0.0–0.7)
Eosinophils Relative: 3 % (ref 0–5)
HCT: 40.1 % (ref 39.0–52.0)
Hemoglobin: 13.7 g/dL (ref 13.0–17.0)
Lymphs Abs: 1.2 10*3/uL (ref 0.7–4.0)
MCH: 28.6 pg (ref 26.0–34.0)
MCV: 83.7 fL (ref 78.0–100.0)
Monocytes Absolute: 1.4 10*3/uL — ABNORMAL HIGH (ref 0.1–1.0)
Monocytes Relative: 15 % — ABNORMAL HIGH (ref 3–12)
RBC: 4.79 MIL/uL (ref 4.22–5.81)

## 2013-03-25 LAB — BASIC METABOLIC PANEL
BUN: 18 mg/dL (ref 6–23)
Calcium: 9.3 mg/dL (ref 8.4–10.5)
Creatinine, Ser: 1.27 mg/dL (ref 0.50–1.35)
GFR calc non Af Amer: 58 mL/min — ABNORMAL LOW (ref >90)
Glucose, Bld: 126 mg/dL — ABNORMAL HIGH (ref 70–99)
Potassium: 3.9 mEq/L (ref 3.5–5.1)

## 2013-03-25 MED ORDER — IBUPROFEN 400 MG PO TABS
600.0000 mg | ORAL_TABLET | Freq: Once | ORAL | Status: AC
  Administered 2013-03-25: 600 mg via ORAL
  Filled 2013-03-25 (×2): qty 1

## 2013-03-25 MED ORDER — LEVOFLOXACIN 750 MG PO TABS: 750.0000 mg | ORAL_TABLET | Freq: Every day | ORAL | Status: DC

## 2013-03-25 NOTE — ED Provider Notes (Signed)
CSN: [id number]     Arrival date & time 03/25/13  1315 History   First MD Initiated Contact with Patient 03/25/13 1502     Chief Complaint  Patient presents with  . Fever   (Consider location/radiation/quality/duration/timing/severity/associated sxs/prior Treatment) Patient is a 62 y.o. male presenting with fever.  Fever Temp source:  Oral Severity:  Moderate Onset quality:  Gradual Duration:  2 days Timing:  Intermittent Progression:  Worsening Chronicity:  New Relieved by:  Acetaminophen Worsened by:  Nothing tried Associated symptoms: cough, myalgias and sore throat (last week, since resolved)   Associated symptoms: no chest pain, no congestion, no diarrhea, no dysuria, no headaches, no nausea, no rhinorrhea and no vomiting     Past Medical History  Diagnosis Date  . Arthritis   . Hyperlipidemia     takes Pravastatin daily  . Bronchitis     couple of months ago  . Joint pain     right shoulder and right knee  . Chronic back pain     bulding disc  . Hx of colonic polyps   . Chronic kidney disease     bladder outlet obstruction-  RECENT HOSPITALIZATION WITH ACUTE RENAL FAILURE 2/13-      recent scans in [facility] from 2/13-  . Diabetes mellitus     takes Metformin bid  . Hypertension     EKG, Chest 2/13 [facility]- followed by Dr Roseanne Reno, labs 05/31/11  [facility]  . Sleep apnea     CPAP-sleep study done 2months ago/ UNABLE TO LOCATE REPORT /  states SEVERE- doesnt know settings   Past Surgical History  Procedure Laterality Date  . Tonsillectomy      as a child  . Colonscopy    . Rt shoulder surgery      bone spur,scrape rotor cuff  . Shoulder arthroscopy      right   05/23/11/   [place]  . Cystoscopy  06/16/2011    Procedure: CYSTOSCOPY;  Surgeon: Crecencio Mc, MD;  Location: [facility];  Service: Urology;  Laterality: N/A;       . Transurethral resection of prostate  06/16/2011    Procedure: TRANSURETHRAL RESECTION OF THE PROSTATE (TURP);  Surgeon: Crecencio Mc, MD;  Location: [facility]  [facility];  Service: Urology;  Laterality: N/A;   Family History  Problem Relation Age of Onset  . Anesthesia problems Neg Hx   . Hypotension Neg Hx   . Malignant hyperthermia Neg Hx   . Pseudochol deficiency Neg Hx    History  Substance Use Topics  . Smoking status: Former Games developer  . Smokeless tobacco: Not on file     Comment: quit 20+yrs ago  . Alcohol Use: Yes     Comment: occasionally     Review of Systems  Constitutional: Positive for fever.  HENT: Positive for sore throat (last week, since resolved). Negative for congestion and rhinorrhea.   Respiratory: Positive for cough. Negative for shortness of breath.   Cardiovascular: Negative for chest pain.  Gastrointestinal: Negative for nausea, vomiting, abdominal pain and diarrhea.  Genitourinary: Negative for dysuria.  Musculoskeletal: Positive for myalgias.  Neurological: Negative for headaches.  All other systems reviewed and are negative.    Allergies  Ecee plus  Home Medications   Current Outpatient Rx  Name  Route  Sig  Dispense  Refill  . amLODipine (NORVASC) 5 MG tablet   Oral   Take 5 mg by mouth daily.          . calcium  carbonate (OS-CAL) 600 MG TABS   Oral   Take 600 mg by mouth daily.          . furosemide (LASIX) 40 MG tablet   Oral   Take 40 mg by mouth 2 (two) times daily.         . Hydrocodone-Acetaminophen 5-300 MG TABS   Oral   Take 1-2 tablets by mouth every 6 (six) hours.   25 each   0   . lisinopril (PRINIVIL,ZESTRIL) 20 MG tablet   Oral   Take 20 mg by mouth daily.         . metFORMIN (GLUCOPHAGE) 500 MG tablet   Oral   Take 500 mg by mouth 2 (two) times daily with a meal.          . potassium chloride SA (K-DUR,KLOR-CON) 20 MEQ tablet   Oral   Take 20 mEq by mouth 2 (two) times daily.         . pravastatin (PRAVACHOL) 40 MG tablet   Oral   Take 20 mg by mouth every evening.           BP 125/62  Pulse 98  Temp(Src) 100.6 F (38.1 C) (Oral)  Resp 16  Wt 295 lb  (133.811 kg)  SpO2 97% Physical Exam  Nursing note and vitals reviewed. Constitutional: He is oriented to person, place, and time. He appears well-developed and well-nourished. No distress.  HENT:  Head: Normocephalic and atraumatic.  Nose: Nose normal.  Mouth/Throat: Oropharynx is clear and moist. No oropharyngeal exudate.  Eyes: Conjunctivae are normal. Pupils are equal, round, and reactive to light. No scleral icterus.  Neck: Neck supple.  Cardiovascular: Normal rate, regular rhythm, normal heart sounds and intact distal pulses.   No murmur heard. Pulmonary/Chest: Effort normal and breath sounds normal. No stridor. No respiratory distress. He has no wheezes. He has no rales.  Abdominal: Soft. He exhibits no distension. There is no tenderness. There is no rebound and no guarding.  Musculoskeletal: Normal range of motion. He exhibits no edema.  Neurological: He is alert and oriented to person, place, and time.  Skin: Skin is warm and dry. No rash noted.  Psychiatric: He has a normal mood and affect. His behavior is normal.    ED Course  Procedures (including critical care time) Labs Review Labs Reviewed  CBC WITH DIFFERENTIAL - Abnormal; Notable for the following:    RDW 15.6 (*)    Monocytes Relative 15 (*)    Monocytes Absolute 1.4 (*)    All other components within normal limits  BASIC METABOLIC PANEL - Abnormal; Notable for the following:    Glucose, Bld 126 (*)    GFR calc non Af Amer 58 (*)    GFR calc Af Amer 67 (*)    All other components within normal limits  LACTIC ACID, PLASMA - Abnormal; Notable for the following:    Lactic Acid, Venous 2.7 (*)    All other components within normal limits  CG4 I-STAT (LACTIC ACID) - Abnormal; Notable for the following:    Lactic Acid, Venous 2.94 (*)    All other components within normal limits   Imaging Review Dg Chest 2 View  03/25/2013   CLINICAL DATA:  Fever  EXAM: CHEST  2 VIEW  COMPARISON:  05/31/2011  FINDINGS: Low  lung volumes. The heart size and mediastinal contours are within normal limits. Both lungs are clear. The visualized skeletal structures are unremarkable.  IMPRESSION: No active cardiopulmonary disease.  Electronically Signed   By: Salome Holmes M.D.   On: 03/25/2013 13:58  All radiology studies independently viewed by me.     EKG Interpretation   None       MDM   1. Fever   2. Cough    62 yo male with fever and myalgias.  Started yesterday.  Cough started today.  Reports temp of 103 at PCP's office and O2 sat of 94%.  He is well appearing, nontoxic, not distressed.  Lungs clear.  CXR clear.  Labwork unremarkable except for mild elevation in POC lactate.  I suspect this may be falsely elevated, as pt is very well appearing.  Will send formal lactate.   Lactate is mildly elevated.  Although his CXR shows no pneumonia, this could represent early pneumonia (esp given acuity of symptoms.)  He remains well appearing.  I adjusted his blood pressure cuff and his BP was normal.  He does not appear septic.  He is tolerating PO fluids well.  Will treat for CAP and have him follow up with his PCP.    Candyce Churn, MD 03/26/13 [phone]

## 2013-03-25 NOTE — ED Notes (Signed)
Pt here c/o fever and cough; pt sent by PCP for further eval; pt sts not felt well x 2 days

## 2013-04-01 IMAGING — CR DG CHEST 1V PORT
1 series · 1 of 1 positions shown · non-contrast
Comparison: 05/16/2011

CLINICAL DATA: Shortness of breath and weakness for 2 days.
History of bronchitis, hypertension, and diabetes.  Ex-smoker.

PORTABLE CHEST - 1 VIEW

[AP]
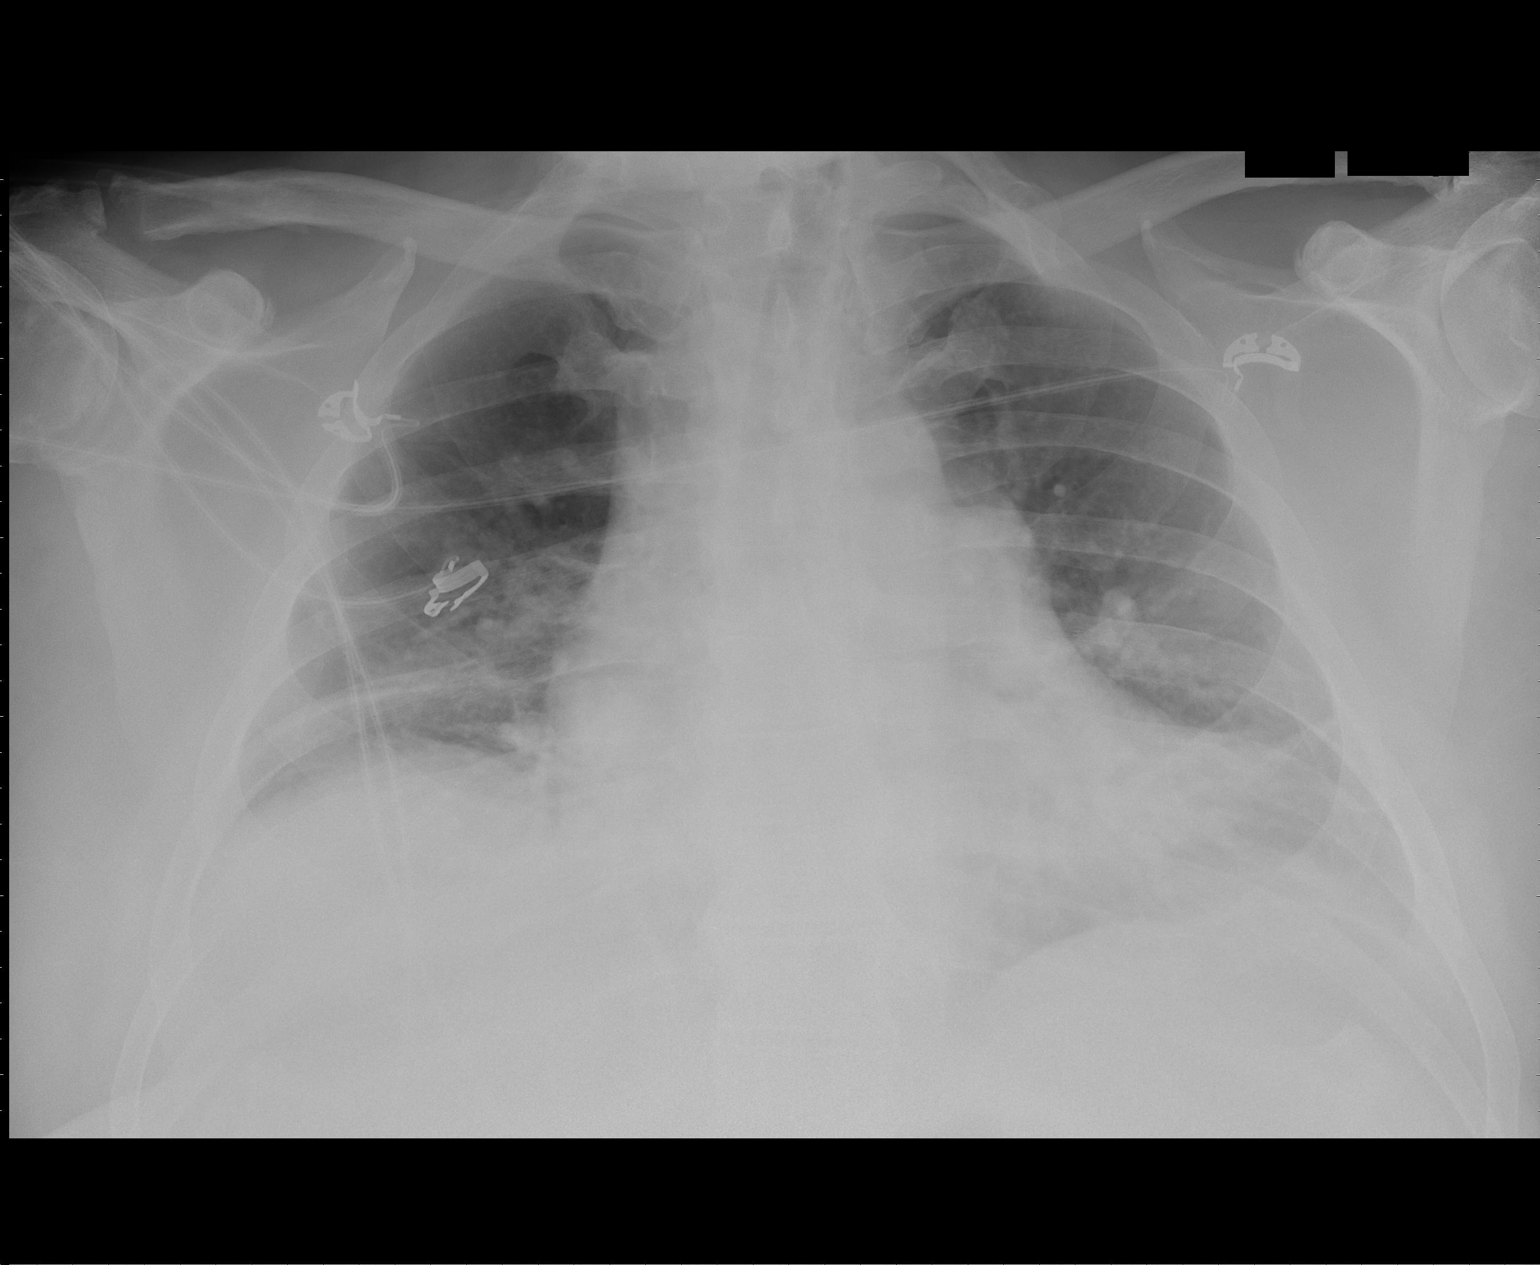

[1 of 1 positions shown; findings below may reference images not displayed]

FINDINGS: Mildly degraded exam due to AP portable technique and
patient body habitus.

Patient rotated left. Cardiomegaly accentuated by AP portable
technique.  Cannot exclude small bilateral pleural effusions. No
pneumothorax.  Low lung volumes with resultant pulmonary
interstitial prominence.  Patchy left greater right bibasilar
airspace disease.
IMPRESSION: Cardiomegaly and low lung volumes.

Bibasilar airspace disease.  Although this could represent
atelectasis and be secondary to low lung volumes/AP portable
technique, infection and small pleural effusions.  cannot be
excluded.

Consider further evaluation with PA and lateral radiographs.

## 2014-01-24 ENCOUNTER — Other Ambulatory Visit: Payer: Self-pay | Admitting: Internal Medicine

## 2014-01-24 DIAGNOSIS — Z0000 Encounter for general adult medical examination without abnormal findings: Secondary | ICD-10-CM

## 2014-02-02 ENCOUNTER — Ambulatory Visit
Admission: RE | Admit: 2014-02-02 | Discharge: 2014-02-02 | Disposition: A | Discharge status: Home / Self Care | Payer: Medicare Other | Source: Ambulatory Visit | Attending: Internal Medicine | Admitting: Internal Medicine

## 2014-02-02 DIAGNOSIS — Z0000 Encounter for general adult medical examination without abnormal findings: Secondary | ICD-10-CM

## 2015-05-15 ENCOUNTER — Emergency Department (HOSPITAL_COMMUNITY): Payer: Medicare Other

## 2015-05-15 ENCOUNTER — Encounter (HOSPITAL_COMMUNITY): Payer: Self-pay

## 2015-05-15 ENCOUNTER — Inpatient Hospital Stay (HOSPITAL_COMMUNITY)
Admission: EM | Admit: 2015-05-15 | Discharge: 2015-05-23 | DRG: 853 | Disposition: A | Discharge status: Home Health | Payer: Medicare Other | Attending: Internal Medicine | Admitting: Internal Medicine

## 2015-05-15 DIAGNOSIS — I129 Hypertensive chronic kidney disease with stage 1 through stage 4 chronic kidney disease, or unspecified chronic kidney disease: Secondary | ICD-10-CM | POA: Diagnosis present

## 2015-05-15 DIAGNOSIS — R7401 Elevation of levels of liver transaminase levels: Secondary | ICD-10-CM | POA: Diagnosis present

## 2015-05-15 DIAGNOSIS — K8020 Calculus of gallbladder without cholecystitis without obstruction: Secondary | ICD-10-CM | POA: Diagnosis not present

## 2015-05-15 DIAGNOSIS — Z79899 Other long term (current) drug therapy: Secondary | ICD-10-CM

## 2015-05-15 DIAGNOSIS — B9689 Other specified bacterial agents as the cause of diseases classified elsewhere: Secondary | ICD-10-CM | POA: Diagnosis present

## 2015-05-15 DIAGNOSIS — R6510 Systemic inflammatory response syndrome (SIRS) of non-infectious origin without acute organ dysfunction: Secondary | ICD-10-CM | POA: Diagnosis present

## 2015-05-15 DIAGNOSIS — G8929 Other chronic pain: Secondary | ICD-10-CM | POA: Diagnosis present

## 2015-05-15 DIAGNOSIS — E875 Hyperkalemia: Secondary | ICD-10-CM | POA: Diagnosis present

## 2015-05-15 DIAGNOSIS — E785 Hyperlipidemia, unspecified: Secondary | ICD-10-CM | POA: Diagnosis present

## 2015-05-15 DIAGNOSIS — R109 Unspecified abdominal pain: Secondary | ICD-10-CM | POA: Diagnosis present

## 2015-05-15 DIAGNOSIS — N400 Enlarged prostate without lower urinary tract symptoms: Secondary | ICD-10-CM | POA: Diagnosis present

## 2015-05-15 DIAGNOSIS — R7881 Bacteremia: Secondary | ICD-10-CM

## 2015-05-15 DIAGNOSIS — E872 Acidosis, unspecified: Secondary | ICD-10-CM | POA: Diagnosis present

## 2015-05-15 DIAGNOSIS — Z419 Encounter for procedure for purposes other than remedying health state, unspecified: Secondary | ICD-10-CM

## 2015-05-15 DIAGNOSIS — E876 Hypokalemia: Secondary | ICD-10-CM | POA: Diagnosis not present

## 2015-05-15 DIAGNOSIS — R935 Abnormal findings on diagnostic imaging of other abdominal regions, including retroperitoneum: Secondary | ICD-10-CM | POA: Diagnosis not present

## 2015-05-15 DIAGNOSIS — I10 Essential (primary) hypertension: Secondary | ICD-10-CM | POA: Diagnosis not present

## 2015-05-15 DIAGNOSIS — E1122 Type 2 diabetes mellitus with diabetic chronic kidney disease: Secondary | ICD-10-CM | POA: Diagnosis not present

## 2015-05-15 DIAGNOSIS — K831 Obstruction of bile duct: Secondary | ICD-10-CM | POA: Diagnosis not present

## 2015-05-15 DIAGNOSIS — K8064 Calculus of gallbladder and bile duct with chronic cholecystitis without obstruction: Secondary | ICD-10-CM | POA: Diagnosis present

## 2015-05-15 DIAGNOSIS — R1030 Lower abdominal pain, unspecified: Secondary | ICD-10-CM

## 2015-05-15 DIAGNOSIS — N189 Chronic kidney disease, unspecified: Secondary | ICD-10-CM | POA: Diagnosis not present

## 2015-05-15 DIAGNOSIS — K859 Acute pancreatitis without necrosis or infection, unspecified: Secondary | ICD-10-CM | POA: Diagnosis not present

## 2015-05-15 DIAGNOSIS — K8050 Calculus of bile duct without cholangitis or cholecystitis without obstruction: Secondary | ICD-10-CM | POA: Diagnosis not present

## 2015-05-15 DIAGNOSIS — K838 Other specified diseases of biliary tract: Secondary | ICD-10-CM | POA: Diagnosis not present

## 2015-05-15 DIAGNOSIS — K8030 Calculus of bile duct with cholangitis, unspecified, without obstruction: Secondary | ICD-10-CM | POA: Diagnosis not present

## 2015-05-15 DIAGNOSIS — Z7984 Long term (current) use of oral hypoglycemic drugs: Secondary | ICD-10-CM | POA: Diagnosis not present

## 2015-05-15 DIAGNOSIS — K839 Disease of biliary tract, unspecified: Secondary | ICD-10-CM | POA: Diagnosis not present

## 2015-05-15 DIAGNOSIS — Z9049 Acquired absence of other specified parts of digestive tract: Secondary | ICD-10-CM

## 2015-05-15 DIAGNOSIS — Y848 Other medical procedures as the cause of abnormal reaction of the patient, or of later complication, without mention of misadventure at the time of the procedure: Secondary | ICD-10-CM | POA: Diagnosis not present

## 2015-05-15 DIAGNOSIS — Z87891 Personal history of nicotine dependence: Secondary | ICD-10-CM | POA: Diagnosis not present

## 2015-05-15 DIAGNOSIS — J449 Chronic obstructive pulmonary disease, unspecified: Secondary | ICD-10-CM | POA: Diagnosis present

## 2015-05-15 DIAGNOSIS — R932 Abnormal findings on diagnostic imaging of liver and biliary tract: Secondary | ICD-10-CM | POA: Diagnosis not present

## 2015-05-15 DIAGNOSIS — G4733 Obstructive sleep apnea (adult) (pediatric): Secondary | ICD-10-CM | POA: Diagnosis present

## 2015-05-15 DIAGNOSIS — R17 Unspecified jaundice: Secondary | ICD-10-CM | POA: Diagnosis not present

## 2015-05-15 DIAGNOSIS — A419 Sepsis, unspecified organism: Principal | ICD-10-CM

## 2015-05-15 DIAGNOSIS — R945 Abnormal results of liver function studies: Secondary | ICD-10-CM

## 2015-05-15 DIAGNOSIS — E119 Type 2 diabetes mellitus without complications: Secondary | ICD-10-CM

## 2015-05-15 DIAGNOSIS — K8309 Other cholangitis: Secondary | ICD-10-CM

## 2015-05-15 DIAGNOSIS — R1084 Generalized abdominal pain: Secondary | ICD-10-CM

## 2015-05-15 DIAGNOSIS — R0603 Acute respiratory distress: Secondary | ICD-10-CM

## 2015-05-15 DIAGNOSIS — R1011 Right upper quadrant pain: Secondary | ICD-10-CM

## 2015-05-15 DIAGNOSIS — Z794 Long term (current) use of insulin: Secondary | ICD-10-CM

## 2015-05-15 DIAGNOSIS — R740 Nonspecific elevation of levels of transaminase and lactic acid dehydrogenase [LDH]: Secondary | ICD-10-CM | POA: Diagnosis not present

## 2015-05-15 DIAGNOSIS — K8010 Calculus of gallbladder with chronic cholecystitis without obstruction: Secondary | ICD-10-CM | POA: Diagnosis not present

## 2015-05-15 DIAGNOSIS — R7989 Other specified abnormal findings of blood chemistry: Secondary | ICD-10-CM

## 2015-05-15 DIAGNOSIS — K830 Cholangitis: Secondary | ICD-10-CM | POA: Diagnosis not present

## 2015-05-15 DIAGNOSIS — Q858 Other phakomatoses, not elsewhere classified: Secondary | ICD-10-CM

## 2015-05-15 DIAGNOSIS — J9811 Atelectasis: Secondary | ICD-10-CM | POA: Diagnosis not present

## 2015-05-15 LAB — I-STAT CG4 LACTIC ACID, ED: Lactic Acid, Venous: 5.32 mmol/L (ref 0.5–2.0)

## 2015-05-15 LAB — URINALYSIS, ROUTINE W REFLEX MICROSCOPIC
Bilirubin Urine: NEGATIVE
Glucose, UA: 250 mg/dL — AB
Hgb urine dipstick: NEGATIVE
KETONES UR: NEGATIVE mg/dL
LEUKOCYTES UA: NEGATIVE
NITRITE: NEGATIVE
PROTEIN: NEGATIVE mg/dL
Specific Gravity, Urine: 1.045 — ABNORMAL HIGH (ref 1.005–1.030)
pH: 5 (ref 5.0–8.0)

## 2015-05-15 LAB — COMPREHENSIVE METABOLIC PANEL
ALT: 149 U/L — ABNORMAL HIGH (ref 17–63)
ANION GAP: 19 — AB (ref 5–15)
AST: 210 U/L — ABNORMAL HIGH (ref 15–41)
Albumin: 4.5 g/dL (ref 3.5–5.0)
Alkaline Phosphatase: 171 U/L — ABNORMAL HIGH (ref 38–126)
BUN: 22 mg/dL — ABNORMAL HIGH (ref 6–20)
CALCIUM: 10.2 mg/dL (ref 8.9–10.3)
CHLORIDE: 100 mmol/L — AB (ref 101–111)
CO2: 20 mmol/L — AB (ref 22–32)
Creatinine, Ser: 1.13 mg/dL (ref 0.61–1.24)
GFR calc non Af Amer: >60 mL/min (ref >60)
Glucose, Bld: 268 mg/dL — ABNORMAL HIGH (ref 65–99)
POTASSIUM: 5.4 mmol/L — AB (ref 3.5–5.1)
Sodium: 139 mmol/L (ref 135–145)
Total Bilirubin: 4.3 mg/dL — ABNORMAL HIGH (ref 0.3–1.2)
Total Protein: 8.1 g/dL (ref 6.5–8.1)

## 2015-05-15 LAB — PROCALCITONIN: Procalcitonin: 3.07 ng/mL

## 2015-05-15 LAB — CBC WITH DIFFERENTIAL/PLATELET
Basophils Absolute: 0 10*3/uL (ref 0.0–0.1)
Basophils Relative: 0 %
EOS ABS: 0 10*3/uL (ref 0.0–0.7)
Eosinophils Relative: 0 %
HCT: 40.1 % (ref 39.0–52.0)
HEMOGLOBIN: 13 g/dL (ref 13.0–17.0)
LYMPHS ABS: 0.6 10*3/uL — AB (ref 0.7–4.0)
Lymphocytes Relative: 4 %
MCH: 27.3 pg (ref 26.0–34.0)
MCHC: 32.4 g/dL (ref 30.0–36.0)
MCV: 84.1 fL (ref 78.0–100.0)
MONOS PCT: 3 %
Monocytes Absolute: 0.5 10*3/uL (ref 0.1–1.0)
NEUTROS PCT: 93 %
Neutro Abs: 14.1 10*3/uL — ABNORMAL HIGH (ref 1.7–7.7)
PLATELETS: 191 10*3/uL (ref 150–400)
RBC: 4.77 MIL/uL (ref 4.22–5.81)
RDW: 14.9 % (ref 11.5–15.5)
WBC: 15.2 10*3/uL — ABNORMAL HIGH (ref 4.0–10.5)

## 2015-05-15 LAB — I-STAT CHEM 8, ED
BUN: 31 mg/dL — ABNORMAL HIGH (ref 6–20)
Calcium, Ion: 1.08 mmol/L — ABNORMAL LOW (ref 1.13–1.30)
Chloride: 100 mmol/L — ABNORMAL LOW (ref 101–111)
Creatinine, Ser: 1.1 mg/dL (ref 0.61–1.24)
GLUCOSE: 268 mg/dL — AB (ref 65–99)
HCT: 47 % (ref 39.0–52.0)
Hemoglobin: 16 g/dL (ref 13.0–17.0)
POTASSIUM: 5.2 mmol/L — AB (ref 3.5–5.1)
SODIUM: 136 mmol/L (ref 135–145)
TCO2: 24 mmol/L (ref 0–100)

## 2015-05-15 LAB — BLOOD GAS, ARTERIAL
Acid-base deficit: 2.9 mmol/L — ABNORMAL HIGH (ref 0.0–2.0)
BICARBONATE: 20.9 meq/L (ref 20.0–24.0)
Drawn by: 437071
FIO2: 0.32
O2 Content: 3 L/min
O2 SAT: 90.8 %
PATIENT TEMPERATURE: 103.3
PCO2 ART: 40 mmHg (ref 35.0–45.0)
PH ART: 7.353 (ref 7.350–7.450)
PO2 ART: 74.2 mmHg — AB (ref 80.0–100.0)
TCO2: 18.9 mmol/L (ref 0–100)

## 2015-05-15 LAB — CBG MONITORING, ED: Glucose-Capillary: 182 mg/dL — ABNORMAL HIGH (ref 65–99)

## 2015-05-15 LAB — CK: Total CK: 71 U/L (ref 49–397)

## 2015-05-15 LAB — LACTIC ACID, PLASMA: Lactic Acid, Venous: 3.7 mmol/L (ref 0.5–2.0)

## 2015-05-15 LAB — LIPASE, BLOOD: LIPASE: 32 U/L (ref 11–51)

## 2015-05-15 MED ORDER — AMLODIPINE BESYLATE 5 MG PO TABS: 5.0000 mg | ORAL_TABLET | Freq: Every day | ORAL | Status: DC

## 2015-05-15 MED ORDER — ENOXAPARIN SODIUM 80 MG/0.8ML ~~LOC~~ SOLN
65.0000 mg | SUBCUTANEOUS | Status: DC
  Administered 2015-05-16 – 2015-05-22 (×6): 65 mg via SUBCUTANEOUS
  Filled 2015-05-15 (×10): qty 0.8

## 2015-05-15 MED ORDER — ACETAMINOPHEN 325 MG PO TABS
650.0000 mg | ORAL_TABLET | Freq: Four times a day (QID) | ORAL | Status: DC | PRN
  Administered 2015-05-15 – 2015-05-22 (×3): 650 mg via ORAL
  Filled 2015-05-15 (×3): qty 2

## 2015-05-15 MED ORDER — MORPHINE SULFATE (PF) 4 MG/ML IV SOLN
4.0000 mg | Freq: Once | INTRAVENOUS | Status: AC
  Administered 2015-05-15: 4 mg via INTRAVENOUS
  Filled 2015-05-15: qty 1

## 2015-05-15 MED ORDER — IOHEXOL 300 MG/ML  SOLN
100.0000 mL | Freq: Once | INTRAMUSCULAR | Status: AC | PRN
  Administered 2015-05-15: 100 mL via INTRAVENOUS

## 2015-05-15 MED ORDER — SODIUM CHLORIDE 0.9 % IV BOLUS (SEPSIS)
1000.0000 mL | Freq: Once | INTRAVENOUS | Status: AC
  Administered 2015-05-15: 1000 mL via INTRAVENOUS

## 2015-05-15 MED ORDER — PRAVASTATIN SODIUM 20 MG PO TABS
20.0000 mg | ORAL_TABLET | Freq: Every evening | ORAL | Status: DC
  Administered 2015-05-15: 20 mg via ORAL
  Filled 2015-05-15: qty 1

## 2015-05-15 MED ORDER — VANCOMYCIN HCL 10 G IV SOLR
1250.0000 mg | Freq: Two times a day (BID) | INTRAVENOUS | Status: DC
  Filled 2015-05-15: qty 1250

## 2015-05-15 MED ORDER — SODIUM CHLORIDE 0.9% FLUSH
3.0000 mL | Freq: Two times a day (BID) | INTRAVENOUS | Status: DC
  Administered 2015-05-16 – 2015-05-22 (×11): 3 mL via INTRAVENOUS

## 2015-05-15 MED ORDER — INSULIN ASPART 100 UNIT/ML ~~LOC~~ SOLN
0.0000 [IU] | SUBCUTANEOUS | Status: DC
  Administered 2015-05-16: 1 [IU] via SUBCUTANEOUS
  Administered 2015-05-16: 2 [IU] via SUBCUTANEOUS
  Administered 2015-05-16: 3 [IU] via SUBCUTANEOUS
  Administered 2015-05-16: 1 [IU] via SUBCUTANEOUS
  Administered 2015-05-17: 2 [IU] via SUBCUTANEOUS
  Administered 2015-05-17 (×3): 1 [IU] via SUBCUTANEOUS
  Administered 2015-05-17: 2 [IU] via SUBCUTANEOUS
  Administered 2015-05-18 – 2015-05-20 (×12): 1 [IU] via SUBCUTANEOUS
  Administered 2015-05-20: 2 [IU] via SUBCUTANEOUS
  Administered 2015-05-20 (×2): 1 [IU] via SUBCUTANEOUS
  Administered 2015-05-20 – 2015-05-21 (×3): 2 [IU] via SUBCUTANEOUS
  Administered 2015-05-21 (×2): 1 [IU] via SUBCUTANEOUS
  Administered 2015-05-21: 2 [IU] via SUBCUTANEOUS
  Administered 2015-05-21 (×2): 1 [IU] via SUBCUTANEOUS
  Administered 2015-05-22 (×2): 2 [IU] via SUBCUTANEOUS
  Administered 2015-05-22: 1 [IU] via SUBCUTANEOUS

## 2015-05-15 MED ORDER — PIPERACILLIN-TAZOBACTAM 3.375 G IVPB
3.3750 g | Freq: Three times a day (TID) | INTRAVENOUS | Status: DC
Start: 2015-05-16 — End: 2015-05-19
  Administered 2015-05-16 – 2015-05-17 (×5): 3.375 g via INTRAVENOUS
  Administered 2015-05-17: 3.375 mg via INTRAVENOUS
  Administered 2015-05-17 – 2015-05-19 (×5): 3.375 g via INTRAVENOUS
  Filled 2015-05-15 (×11): qty 50

## 2015-05-15 MED ORDER — SODIUM CHLORIDE 0.9 % IV BOLUS (SEPSIS)
1000.0000 mL | INTRAVENOUS | Status: DC
  Administered 2015-05-15: 1000 mL via INTRAVENOUS

## 2015-05-15 MED ORDER — VANCOMYCIN HCL 10 G IV SOLR
2000.0000 mg | Freq: Once | INTRAVENOUS | Status: AC
  Administered 2015-05-15: 2000 mg via INTRAVENOUS
  Filled 2015-05-15: qty 2000

## 2015-05-15 MED ORDER — LISINOPRIL 20 MG PO TABS: 20.0000 mg | ORAL_TABLET | Freq: Every day | ORAL | Status: DC

## 2015-05-15 MED ORDER — ONDANSETRON HCL 4 MG/2ML IJ SOLN
4.0000 mg | Freq: Once | INTRAMUSCULAR | Status: AC
  Administered 2015-05-15: 4 mg via INTRAVENOUS
  Filled 2015-05-15: qty 2

## 2015-05-15 MED ORDER — ACETAMINOPHEN 650 MG RE SUPP: 650.0000 mg | Freq: Four times a day (QID) | RECTAL | Status: DC | PRN

## 2015-05-15 MED ORDER — PIPERACILLIN-TAZOBACTAM 3.375 G IVPB 30 MIN
3.3750 g | Freq: Once | INTRAVENOUS | Status: AC
  Administered 2015-05-15: 3.375 g via INTRAVENOUS
  Filled 2015-05-15: qty 50

## 2015-05-15 NOTE — ED Notes (Signed)
Patient c/o mid abdominal pain/cramping. Stating that he can not get comfortable. Patient rates pain 6/7. Patient went to [facility] physicians today and ws told t ocome to the ED for further evaluation. Patient had a normal BM today. Patient denies N/V.

## 2015-05-15 NOTE — ED Provider Notes (Signed)
CSN: [id number]     Arrival date & time 05/15/15  1739 History   First MD Initiated Contact with Patient 05/15/15 1809     Chief Complaint  Patient presents with  . Abdominal Pain     (Consider location/radiation/quality/duration/timing/severity/associated sxs/prior Treatment) Patient is a 65 y.o. male presenting with abdominal pain. The history is provided by the patient.  Abdominal Pain Pain location:  Periumbilical Pain radiates to:  Does not radiate Pain severity:  Severe Onset quality:  Sudden Duration:  4 hours Timing:  Constant Progression:  Partially resolved Chronicity:  New Relieved by:  Nothing Worsened by:  Nothing tried Ineffective treatments:  None tried Associated symptoms: nausea   Associated symptoms: no chest pain, no chills, no constipation, no diarrhea, no fever, no shortness of breath and no vomiting   Risk factors: being elderly     64 yo M lower abdominal pain. Patient states this started suddenly and was very severe. Patient said it feels like really strong stomach cramps. Denies any radiation. At some diaphoresis and nausea but denies vomiting. Has never had pain like this before. Patient denies any prior surgery. States his symptoms are significantly better now down to a 2 out of 10 where his initial symptoms for 7 or 8 out of 10. Patient denies any diarrhea. Denies any sick contacts. Nothing seems to make it better or worse.  Past Medical History  Diagnosis Date  . Arthritis   . Hyperlipidemia     takes Pravastatin daily  . Bronchitis     couple of months ago  . Joint pain     right shoulder and right knee  . Chronic back pain     bulding disc  . Hx of colonic polyps   . Chronic kidney disease     bladder outlet obstruction-  RECENT HOSPITALIZATION WITH ACUTE RENAL FAILURE 2/13-      recent scans in [facility] from 2/13-  . Diabetes mellitus     takes Metformin bid  . Hypertension     EKG, Chest 2/13 [facility]- followed by Dr Roseanne Reno, labs 05/31/11   [facility]  . Sleep apnea     CPAP-sleep study done 2months ago/ UNABLE TO LOCATE REPORT /  states [facility]- doesnt know settings   Past Surgical History  Procedure Laterality Date  . Tonsillectomy      as a child  . Colonscopy    . Rt shoulder surgery      bone spur,scrape rotor cuff  . Shoulder arthroscopy      right   05/23/11/   [facility]  . Cystoscopy  06/16/2011    Procedure: CYSTOSCOPY;  Surgeon: Crecencio Mc, MD;  Location: [facility];  Service: Urology;  Laterality: N/A;       . Transurethral resection of prostate  06/16/2011    Procedure: TRANSURETHRAL RESECTION OF THE PROSTATE (TURP);  Surgeon: Crecencio Mc, MD;  Location: [facility];  Service: Urology;  Laterality: N/A;   Family History  Problem Relation Age of Onset  . Anesthesia problems Neg Hx   . Hypotension Neg Hx   . Malignant hyperthermia Neg Hx   . Pseudochol deficiency Neg Hx    Social History  Substance Use Topics  . Smoking status: Former Games developer  . Smokeless tobacco: None     Comment: quit 20+yrs ago  . Alcohol Use: Yes     Comment: occasionally     Review of Systems  Constitutional: Negative for fever and chills.  HENT: Negative for congestion and  facial swelling.   Eyes: Negative for discharge and visual disturbance.  Respiratory: Negative for shortness of breath.   Cardiovascular: Negative for chest pain and palpitations.  Gastrointestinal: Positive for nausea and abdominal pain. Negative for vomiting, diarrhea, constipation and abdominal distention.  Musculoskeletal: Negative for myalgias and arthralgias.  Skin: Negative for color change and rash.  Neurological: Negative for tremors, syncope and headaches.  Psychiatric/Behavioral: Negative for confusion and dysphoric mood.      Allergies  Ecee plus  Home Medications   Prior to Admission medications   Medication Sig Start Date End Date Taking? Authorizing Provider  amLODipine (NORVASC) 5 MG tablet Take 5 mg by mouth daily.    Yes Historical Provider, MD   calcium carbonate (OS-CAL) 600 MG TABS Take 600 mg by mouth every evening.    Yes Historical Provider, MD  docusate sodium (COLACE) 50 MG capsule Take 50 mg by mouth 2 (two) times daily.   Yes Historical Provider, MD  furosemide (LASIX) 40 MG tablet Take 40 mg by mouth daily.    Yes Historical Provider, MD  lisinopril (PRINIVIL,ZESTRIL) 20 MG tablet Take 20 mg by mouth daily.   Yes Historical Provider, MD  metFORMIN (GLUCOPHAGE) 1000 MG tablet Take 1,000 mg by mouth 2 (two) times daily. 05/05/15  Yes Historical Provider, MD  potassium chloride SA (K-DUR,KLOR-CON) 20 MEQ tablet Take 20 mEq by mouth daily.    Yes Historical Provider, MD  pravastatin (PRAVACHOL) 40 MG tablet Take 20 mg by mouth every evening.    Yes Historical Provider, MD   BP 106/59 mmHg  Pulse 127  Temp(Src) 103.1 F (39.5 C) (Oral)  Resp 33  Ht  (1.88 m)  Wt 290 lb (131.543 kg)  BMI 37.22 kg/m2  SpO2 3% Physical Exam  Constitutional: He is oriented to person, place, and time. He appears well-developed and well-nourished.  HENT:  Head: Normocephalic and atraumatic.  Eyes: EOM are normal. Pupils are equal, round, and reactive to light.  Neck: Normal range of motion. Neck supple. No JVD present.  Cardiovascular: Normal rate and regular rhythm.  Exam reveals no gallop and no friction rub.   No murmur heard. Pulmonary/Chest: No respiratory distress. He has no wheezes.  Abdominal: He exhibits distension (chronic per patient). There is no tenderness. There is no rebound and no guarding.  Obese, difficult to obtain exam  Musculoskeletal: Normal range of motion.  Neurological: He is alert and oriented to person, place, and time.  Skin: No rash noted. No pallor.  Psychiatric: He has a normal mood and affect. His behavior is normal.  Nursing note and vitals reviewed.   ED Course  Procedures (including critical care time) Labs Review Labs Reviewed  COMPREHENSIVE METABOLIC PANEL - Abnormal; Notable for the following:     Potassium 5.4 (*)    Chloride 100 (*)    CO2 20 (*)    Glucose, Bld 268 (*)    BUN 22 (*)    AST 210 (*)    ALT 149 (*)    Alkaline Phosphatase 171 (*)    Total Bilirubin 4.3 (*)    Anion gap 19 (*)    All other components within normal limits  URINALYSIS, ROUTINE W REFLEX MICROSCOPIC (NOT AT [facility]) - Abnormal; Notable for the following:    Specific Gravity, Urine 1.045 (*)    Glucose, UA 250 (*)    All other components within normal limits  CBC WITH DIFFERENTIAL/PLATELET - Abnormal; Notable for the following:    WBC 15.2 (*)  Neutro Abs 14.1 (*)    Lymphs Abs 0.6 (*)    All other components within normal limits  BLOOD GAS, ARTERIAL - Abnormal; Notable for the following:    pO2, Arterial 74.2 (*)    Acid-base deficit 2.9 (*)    All other components within normal limits  LACTIC ACID, PLASMA - Abnormal; Notable for the following:    Lactic Acid, Venous 3.7 (*)    All other components within normal limits  I-STAT CG4 LACTIC ACID, ED - Abnormal; Notable for the following:    Lactic Acid, Venous 5.32 (*)    All other components within normal limits  I-STAT CHEM 8, ED - Abnormal; Notable for the following:    Potassium 5.2 (*)    Chloride 100 (*)    BUN 31 (*)    Glucose, Bld 268 (*)    Calcium, Ion 1.08 (*)    All other components within normal limits  CBG MONITORING, ED - Abnormal; Notable for the following:    Glucose-Capillary 182 (*)    All other components within normal limits  CULTURE, BLOOD (ROUTINE X 2)  CULTURE, BLOOD (ROUTINE X 2)  LIPASE, BLOOD  CK  PROCALCITONIN  CBC WITH DIFFERENTIAL/PLATELET  CBC WITH DIFFERENTIAL/PLATELET  LACTIC ACID, PLASMA  CBC  COMPREHENSIVE METABOLIC PANEL    Imaging Review Ct Abdomen Pelvis W Contrast  05/15/2015  CLINICAL DATA:  Mid abdominal pain and cramping. Shortness of breath. EXAM: CT ABDOMEN AND PELVIS WITH CONTRAST TECHNIQUE: Multidetector CT imaging of the abdomen and pelvis was performed using the standard protocol  following bolus administration of intravenous contrast. CONTRAST:  OMNIPAQUE IOHEXOL 300 MG/ML  SOLN COMPARISON:  05/31/2012 FINDINGS: Lower chest:  No acute findings. Hepatobiliary: 1.3 cm calcified gallstone without significant gallbladder distention or wall thickening. Normal appearance of liver and the portal venous system is patent. Pancreas: Normal appearance of the pancreas without inflammation or duct dilatation. Spleen: Normal appearance of spleen without enlargement. Adrenals/Urinary Tract: Many of the images are limited by motion artifact. There is a stable exophytic 1.1 cm low-density structure in the lateral right kidney which probably represents a small exophytic cyst. Question another small cyst in the anterior right kidney. Normal appearance of the adrenal glands. No hydronephrosis or kidney stones. Normal appearance of the urinary bladder. Probable small cyst in the anterior left kidney that is too small to definitively characterize. Stomach/Bowel: Scattered colonic diverticular without acute inflammation. Mild dilatation of proximal jejunum. No evidence for obstruction. Vascular/Lymphatic: Again noted are prominent peri-aortic lymph nodes. Index lesion anterior to the aorta measures 1.3 x 1.1 cm on sequence 2, image 41 and previously measured 1.2 x 1.1 cm. The mild retroperitoneal lymphadenopathy has not significantly changed from the prior examination. Atherosclerotic calcifications in the abdominal aorta without aneurysm. Reproductive: Prostate is large for size measuring 5.5 x 6.5 cm on sequence 2, image 87. Prostate is nodular along the bladder base. Calcifications along the posterior aspect of the prostate. Other: No free fluid. Musculoskeletal: Multilevel degenerative facet disease in lumbar spine. No acute bone abnormality. IMPRESSION: No acute abnormality in the abdomen or pelvis. Cholelithiasis.  No evidence for gallbladder inflammation. Prostate hypertrophy.  Recommend correlation  with PSA level. Electronically Signed   By: Richarda Overlie M.D.   On: 05/15/2015 19:30   Dg Chest Port 1 View  05/15/2015  CLINICAL DATA:  Patient greater mid abdominal pain and cramping. Some shortness of breath. Former smoker. History of hypertension and diabetes. EXAM: PORTABLE CHEST 1 VIEW COMPARISON:  03/25/2013 FINDINGS: There is mild opacity at the lung bases most likely atelectasis. No evidence of pulmonary edema. No apparent pleural effusion and no evidence of a pneumothorax. Cardiac silhouette is normal in size. No mediastinal or hilar masses or evidence of adenopathy. Bony thorax is grossly intact. IMPRESSION: 1. No acute cardiopulmonary disease. 2. Mild lung base atelectasis. Electronically Signed   By: Amie Portland M.D.   On: 05/15/2015 21:14   US Abdomen Limited Ruq  05/15/2015  CLINICAL DATA:  Abdominal pain, cramping. Elevated liver function tests. EXAM: US ABDOMEN LIMITED - RIGHT UPPER QUADRANT COMPARISON:  CT abdomen and pelvis May 15, 2015 at 1913 hours FINDINGS: Limited assessment due to large body habitus. Gallbladder: Echogenic gallstone with acoustic shadowing. Gallbladder wall is mildly thickened at 3-4 mm. Trace pericholecystic fluid. No sonographic Murphy's sign elicited. Common bile duct: Diameter: 3.4 mm. Liver: No focal lesion identified. Diffusely echogenic. Hepatopetal portal vein. IMPRESSION: Habitus limited examination. Cholelithiasis and sonographic findings of mild cholecystitis though, no sonographic Murphy's sign elicited. Hepatic steatosis. Electronically Signed   By: Awilda Metro M.D.   On: 05/15/2015 21:12   I have personally reviewed and evaluated these images and lab results as part of my medical decision-making.   EKG Interpretation   Date/Time:  Tuesday May 15 2015 18:05:14 EST Ventricular Rate:  130 PR Interval:    QRS Duration: 107 QT Interval:  304 QTC Calculation: 447 R Axis:   93 Text Interpretation:  Right axis deviation Low voltage,  precordial leads  Repol abnrm suggests ischemia, inferior leads Artifact in lead(s) I II III  aVR aVL V1 and baseline wander in lead(s) V5 TECHNICALLY DIFFICULT  Otherwise no significant change Reconfirmed by Medford Staheli MD, Reuel Boom ([id number]) on  05/15/2015 7:29:27 PM      Emergency Focused Ultrasound Exam Limited Retroperitoneal Ultrasound of the Abdominal Aorta.   Performed and interpreted by Dr. Adela Lank Indication: abdominal pain Multiple views of the abdominal aorta are obtained from the diaphragmatic hiatus to the aortic bifurcation in transverse and sagittal planes with a multi-frequency probe. What is visualized is grossly normal.  Findings: difficult to obtain secondary to abdominal girth Interpretation: no abdominal aortic aneurysm, no dissection visualized, no signs of impending rupture Images archived electronically. CPT Code: 60454   MDM   Final diagnoses:  RUQ abdominal pain    65 yo M with a chief complaints of lower abdominal pain. Patient had sudden severe pain. Symptoms have partially resolved however the patient is tachycardic into the 130s.  Patient is also trembling all over. Unable to reproduce pain on exam. Will obtain a CT scan. Patient CMP with elevated AST ALT and alkaline phosphatase as well as an elevated total bili. Initial lactate was 5.3.  UA negative, cxr negative.  CT scan negative.  Patient with persistent tachycardia into the 150's, will cover with vanc, and zosyn.    Discussed with hospitalist.  Obtained RUQ [place].  Lactic improved.  Given 30cc/kg of iv fluid.   CRITICAL CARE Performed by: Rae Roam   Total critical care time: 80 minutes  Critical care time was exclusive of separately billable procedures and treating other patients.  Critical care was necessary to treat or prevent imminent or life-threatening deterioration.  Critical care was time spent personally by me on the following activities: development of treatment plan with patient  and/or surrogate as well as nursing, discussions with consultants, evaluation of patient's response to treatment, examination of patient, obtaining history from patient or surrogate, ordering and performing treatments  and interventions, ordering and review of laboratory studies, ordering and review of radiographic studies, pulse oximetry and re-evaluation of patient's condition.   The patients results and plan were reviewed and discussed.   Any x-rays performed were independently reviewed by myself.   Differential diagnosis were considered with the presenting HPI.  Medications  piperacillin-tazobactam (ZOSYN) IVPB 3.375 g (not administered)  sodium chloride 0.9 % bolus 1,000 mL (1,000 mLs Intravenous New Bag/Given 05/15/15 2204)  piperacillin-tazobactam (ZOSYN) IVPB 3.375 g (not administered)  vancomycin (VANCOCIN) 1,250 mg in sodium chloride 0.9 % 250 mL IVPB (not administered)  pravastatin (PRAVACHOL) tablet 20 mg (20 mg Oral Given 05/15/15 2222)  insulin aspart (novoLOG) injection 0-9 Units (not administered)  enoxaparin (LOVENOX) injection 65 mg (not administered)  sodium chloride flush (NS) 0.9 % injection 3 mL (not administered)  acetaminophen (TYLENOL) tablet 650 mg (650 mg Oral Given 05/15/15 2222)    Or  acetaminophen (TYLENOL) suppository 650 mg ( Rectal See Alternative 05/15/15 2222)  sodium chloride 0.9 % bolus 1,000 mL (0 mLs Intravenous Stopped 05/15/15 2043)  morphine 4 MG/ML injection 4 mg (4 mg Intravenous Given 05/15/15 1833)  ondansetron (ZOFRAN) injection 4 mg (4 mg Intravenous Given 05/15/15 1833)  iohexol (OMNIPAQUE) 300 MG/ML solution 100 mL (100 mLs Intravenous Contrast Given 05/15/15 1904)  sodium chloride 0.9 % bolus 1,000 mL (1,000 mLs Intravenous New Bag/Given 05/15/15 2043)  vancomycin (VANCOCIN) 2,000 mg in sodium chloride 0.9 % 500 mL IVPB (2,000 mg Intravenous New Bag/Given 05/15/15 2100)    Filed Vitals:   05/15/15 1849 05/15/15 2007 05/15/15 2213 05/15/15 2224   BP: 176/82  106/59 106/59  Pulse: 134 143 128 127  Temp:  99.5 F (37.5 C) 103.1 F (39.5 C) 103.1 F (39.5 C)  TempSrc:  Oral Oral Oral  Resp: 22 44 45 33  Height:      Weight:      SpO2: 94% 96%  3%    Final diagnoses:  RUQ abdominal pain    Admission/ observation were discussed with the admitting physician, patient and/or family and they are comfortable with the plan.    Melene Plan, DO 05/15/15 2304

## 2015-05-15 NOTE — ED Notes (Signed)
Xray in room 15 min procedure,

## 2015-05-15 NOTE — H&P (Signed)
[facility] History and Physical  Joseph Clay RUE:[id number] DOB: Jul 26, 1950 DOA: 05/15/2015  Referring physician: EDP PCP: [name], MD   Chief Complaint: Abdominal pain   HPI: Joseph Clay is a 65 y.o. male who presents to the ED with 4 hour history of severe BLQ abdominal pain.  Symptoms onset 4 hours ago, associated with nausea, has apparently completely resolved at this point in the ED.  No pain anywhere else, no headache, no meningismus, no rash, no foot pain.  No chest pain, no SOB, no vomiting, no blood in stool.  No fever/chills.  No leg swelling or pain, no recent travel or prolonged immobility.  Review of Systems: Systems reviewed.  As above, otherwise negative  Past Medical History  Diagnosis Date  . Arthritis   . Hyperlipidemia     takes Pravastatin daily  . Bronchitis     couple of months ago  . Joint pain     right shoulder and right knee  . Chronic back pain     bulding disc  . Hx of colonic polyps   . Chronic kidney disease     bladder outlet obstruction-  RECENT HOSPITALIZATION WITH ACUTE RENAL FAILURE 2/13-      recent scans in [facility] from 2/13-  . Diabetes mellitus     takes Metformin bid  . Hypertension     EKG, Chest 2/13 [facility]- followed by Dr Roseanne Reno, labs 05/31/11  ep[facility]  . Sleep apnea     CPAP-sleep study done 2months ago/ UNABLE TO LOCATE REPORT /  states SEVERE- doesnt know settings   Past Surgical History  Procedure Laterality Date  . Tonsillectomy      as a child  . Colonscopy    . Rt shoulder surgery      bone spur,scrape rotor cuff  . Shoulder arthroscopy      right   05/23/11/   [facility]  . Cystoscopy  06/16/2011    Procedure: CYSTOSCOPY;  Surgeon: Crecencio Mc, MD;  Location: [facility];  Service: Urology;  Laterality: N/A;       . Transurethral resection of prostate  06/16/2011    Procedure: TRANSURETHRAL RESECTION OF THE PROSTATE (TURP);  Surgeon: Crecencio Mc, MD;  Location: [facility];  Service: Urology;  Laterality:  N/A;   Social History:  reports that he has quit smoking. He does not have any smokeless tobacco history on file. He reports that he drinks alcohol. He reports that he does not use illicit drugs.  Allergies  Allergen Reactions  . Ecee Plus Other (See Comments)    unknown    Family History  Problem Relation Age of Onset  . Anesthesia problems Neg Hx   . Hypotension Neg Hx   . Malignant hyperthermia Neg Hx   . Pseudochol deficiency Neg Hx      Prior to Admission medications   Medication Sig Start Date End Date Taking? Authorizing Provider  amLODipine (NORVASC) 5 MG tablet Take 5 mg by mouth daily.    Yes Historical Provider, MD  calcium carbonate (OS-CAL) 600 MG TABS Take 600 mg by mouth every evening.    Yes Historical Provider, MD  docusate sodium (COLACE) 50 MG capsule Take 50 mg by mouth 2 (two) times daily.   Yes Historical Provider, MD  furosemide (LASIX) 40 MG tablet Take 40 mg by mouth daily.    Yes Historical Provider, MD  lisinopril (PRINIVIL,ZESTRIL) 20 MG tablet Take 20 mg by mouth daily.   Yes Historical Provider,  MD  metFORMIN (GLUCOPHAGE) 1000 MG tablet Take 1,000 mg by mouth 2 (two) times daily. 05/05/15  Yes Historical Provider, MD  potassium chloride SA (K-DUR,KLOR-CON) 20 MEQ tablet Take 20 mEq by mouth daily.    Yes Historical Provider, MD  pravastatin (PRAVACHOL) 40 MG tablet Take 20 mg by mouth every evening.    Yes Historical Provider, MD   Physical Exam: Filed Vitals:   05/15/15 1849 05/15/15 2007  BP: 176/82   Pulse: 134 143  Temp:  99.5 F (37.5 C)  Resp: 22 44    BP 176/82 mmHg  Pulse 143  Temp(Src) 99.5 F (37.5 C) (Oral)  Resp 44  Ht  (1.88 m)  Wt 131.543 kg (290 lb)  BMI 37.22 kg/m2  SpO2 96%  General Appearance:    Alert, oriented, no distress, but is breathing at rate of 40, appears stated age  Head:    Normocephalic, atraumatic  Eyes:    PERRL, EOMI, sclera non-icteric        Nose:   Nares without drainage or epistaxis. Mucosa,  turbinates normal  Throat:   Moist mucous membranes. Oropharynx without erythema or exudate.  Neck:   Supple. No carotid bruits.  No thyromegaly.  No lymphadenopathy.   Back:     No CVA tenderness, no spinal tenderness  Lungs:     Clear to auscultation bilaterally, without wheezes, rhonchi or rales, Tachypnic  Chest wall:    No tenderness to palpitation  Heart:    Tachycardic, regular without murmurs, gallops, rubs  Abdomen:     Soft, non-tender, Obese  Genitalia:    deferred  Rectal:    deferred  Extremities:   No clubbing, cyanosis or edema. No diabetic foot ulcers.  Pulses:   2+ and symmetric all extremities  Skin:   Skin color, texture, turgor normal, no rashes or lesions  Lymph nodes:   Cervical, supraclavicular, and axillary nodes normal  Neurologic:   CNII-XII intact. Normal strength, sensation and reflexes      throughout    Labs on Admission:  Basic Metabolic Panel:  Recent Labs Lab 05/15/15 1825 05/15/15 1832  NA 139 136  K 5.4* 5.2*  CL 100* 100*  CO2 20*  --   GLUCOSE 268* 268*  BUN 22* 31*  CREATININE 1.13 1.10  CALCIUM 10.2  --    Liver Function Tests:  Recent Labs Lab 05/15/15 1825  AST 210*  ALT 149*  ALKPHOS 171*  BILITOT 4.3*  PROT 8.1  ALBUMIN 4.5    Recent Labs Lab 05/15/15 1825  LIPASE 32   No results for input(s): AMMONIA in the last 168 hours. CBC:  Recent Labs Lab 05/15/15 1832 05/15/15 2043  WBC  --  15.2*  NEUTROABS  --  14.1*  HGB 16.0 13.0  HCT 47.0 40.1  MCV  --  84.1  PLT  --  191   Cardiac Enzymes:  Recent Labs Lab 05/15/15 1931  CKTOTAL 71    BNP (last 3 results) No results for input(s): PROBNP in the last 8760 hours. CBG: No results for input(s): GLUCAP in the last 168 hours.  Radiological Exams on Admission: Ct Abdomen Pelvis W Contrast  05/15/2015  CLINICAL DATA:  Mid abdominal pain and cramping. Shortness of breath. EXAM: CT ABDOMEN AND PELVIS WITH CONTRAST TECHNIQUE: Multidetector CT imaging of the  abdomen and pelvis was performed using the standard protocol following bolus administration of intravenous contrast. CONTRAST:  OMNIPAQUE IOHEXOL 300 MG/ML  SOLN COMPARISON:  05/31/2012  FINDINGS: Lower chest:  No acute findings. Hepatobiliary: 1.3 cm calcified gallstone without significant gallbladder distention or wall thickening. Normal appearance of liver and the portal venous system is patent. Pancreas: Normal appearance of the pancreas without inflammation or duct dilatation. Spleen: Normal appearance of spleen without enlargement. Adrenals/Urinary Tract: Many of the images are limited by motion artifact. There is a stable exophytic 1.1 cm low-density structure in the lateral right kidney which probably represents a small exophytic cyst. Question another small cyst in the anterior right kidney. Normal appearance of the adrenal glands. No hydronephrosis or kidney stones. Normal appearance of the urinary bladder. Probable small cyst in the anterior left kidney that is too small to definitively characterize. Stomach/Bowel: Scattered colonic diverticular without acute inflammation. Mild dilatation of proximal jejunum. No evidence for obstruction. Vascular/Lymphatic: Again noted are prominent peri-aortic lymph nodes. Index lesion anterior to the aorta measures 1.3 x 1.1 cm on sequence 2, image 41 and previously measured 1.2 x 1.1 cm. The mild retroperitoneal lymphadenopathy has not significantly changed from the prior examination. Atherosclerotic calcifications in the abdominal aorta without aneurysm. Reproductive: Prostate is large for size measuring 5.5 x 6.5 cm on sequence 2, image 87. Prostate is nodular along the bladder base. Calcifications along the posterior aspect of the prostate. Other: No free fluid. Musculoskeletal: Multilevel degenerative facet disease in lumbar spine. No acute bone abnormality. IMPRESSION: No acute abnormality in the abdomen or pelvis. Cholelithiasis.  No evidence for gallbladder  inflammation. Prostate hypertrophy.  Recommend correlation with PSA level. Electronically Signed   By: Richarda Overlie M.D.   On: 05/15/2015 19:30   US Abdomen Limited Ruq  05/15/2015  CLINICAL DATA:  Abdominal pain, cramping. Elevated liver function tests. EXAM: US ABDOMEN LIMITED - RIGHT UPPER QUADRANT COMPARISON:  CT abdomen and pelvis May 15, 2015 at 1913 hours FINDINGS: Limited assessment due to large body habitus. Gallbladder: Echogenic gallstone with acoustic shadowing. Gallbladder wall is mildly thickened at 3-4 mm. Trace pericholecystic fluid. No sonographic Murphy's sign elicited. Common bile duct: Diameter: 3.4 mm. Liver: No focal lesion identified. Diffusely echogenic. Hepatopetal portal vein. IMPRESSION: Habitus limited examination. Cholelithiasis and sonographic findings of mild cholecystitis though, no sonographic Murphy's sign elicited. Hepatic steatosis. Electronically Signed   By: Awilda Metro M.D.   On: 05/15/2015 21:12    EKG: Independently reviewed.  Assessment/Plan Principal Problem:   SIRS (systemic inflammatory response syndrome) (HCC) Active Problems:   HTN (hypertension)   DM type 2 (diabetes mellitus, type 2) (HCC)   Abdominal pain   Lactic acidosis   Hyperbilirubinemia   Transaminitis   1. SIRS - empiric treatment of sepsis 1. Getting 4L fluid bolus 2. Sepsis pathway 1. Procalcitonin pending 3. Empiric zosyn / vanc for now 4. Headed to SDU at the very least 5. Repeat CBC in AM to trend leukocytosis 6. Troponin also pending but having no CP or SOB at this point to suggest ACS, also his abd pain was lower abd pain when he did have it. 2. Lactic acidosis - 1. Repeat pending 2. Hold metformin 3. IVF 3. Abdominal pain - spontaneously resolved at this point, CT and [place] negative 4. Hyperbilirubinemia and transaminitis - negative CT and [place] at this point 1. Repeat CMP in AM to trend 5. DM2 - SSI low dose q4h 6. HTN - continue home meds for now as he is still  hypertensive in ED, if BP softens due to sepsis before morning then will need to hold these.    Code Status: Full Family  Communication: Family at bedside Disposition Plan: Admit to inpatient  Time spent: 70 min  Jannessa Ogden M. [facility] Pager [phone]  If 7AM-7PM, please contact the day team taking care of the patient [email or web address] Password [facility] 05/15/2015, 9:16 PM

## 2015-05-15 NOTE — Progress Notes (Signed)
Pharmacy Antibiotic Note  Joseph Clay is a 65 y.o. male admitted on 05/15/2015 with sepsis.  Pharmacy has been consulted for vancomycin and Zosyn dosing.  Plan: Zosyn 3.375gm IV q8h (4hr extended infusions) Vancomycin 2g IV x 1 in ED, then  IV q12h Check trough at steady state Follow up renal function & cultures, clinical course  Height:  (188 cm) Weight: 290 lb (131.543 kg) IBW/kg (Calculated) : 82.2  Temp (24hrs), Avg:98.5 F (36.9 C), Min:97.5 F (36.4 C), Max:99.5 F (37.5 C)   Recent Labs Lab 05/15/15 1825 05/15/15 1832 05/15/15 1833 05/15/15 2043  WBC  --   --   --  15.2*  CREATININE 1.13 1.10  --   --   LATICACIDVEN  --   --  5.32*  --     Estimated Creatinine Clearance: 95.2 mL/min (by C-G formula based on Cr of 1.1).    Allergies  Allergen Reactions  . Ecee Plus Other (See Comments)    unknown    Antimicrobials this admission: 1/31 >> vancomycin >> 1/31 >> zosyn >>  Dose adjustments this admission: ---  Microbiology results: 1/31 BCx: sent  Thank you for allowing pharmacy to be a part of this patient's care.  Loralee Pacas, PharmD, BCPS Pager: [phone] 05/15/2015 8:53 PM

## 2015-05-15 NOTE — ED Notes (Signed)
UNABLE TO COLLECT LABS PATIENT IS NOT IN THE ROOM 

## 2015-05-16 ENCOUNTER — Inpatient Hospital Stay (HOSPITAL_COMMUNITY): Payer: Medicare Other

## 2015-05-16 LAB — COMPREHENSIVE METABOLIC PANEL
ALK PHOS: 135 U/L — AB (ref 38–126)
ALT: 196 U/L — AB (ref 17–63)
AST: 194 U/L — ABNORMAL HIGH (ref 15–41)
Albumin: 3.6 g/dL (ref 3.5–5.0)
Anion gap: 9 (ref 5–15)
BILIRUBIN TOTAL: 4.2 mg/dL — AB (ref 0.3–1.2)
BUN: 22 mg/dL — ABNORMAL HIGH (ref 6–20)
CALCIUM: 8.4 mg/dL — AB (ref 8.9–10.3)
CO2: 25 mmol/L (ref 22–32)
CREATININE: 1.64 mg/dL — AB (ref 0.61–1.24)
Chloride: 104 mmol/L (ref 101–111)
GFR calc non Af Amer: 42 mL/min — ABNORMAL LOW (ref >60)
GFR, EST AFRICAN AMERICAN: 49 mL/min — AB (ref >60)
Glucose, Bld: 179 mg/dL — ABNORMAL HIGH (ref 65–99)
Potassium: 6.3 mmol/L (ref 3.5–5.1)
SODIUM: 138 mmol/L (ref 135–145)
TOTAL PROTEIN: 6.9 g/dL (ref 6.5–8.1)

## 2015-05-16 LAB — CBC
HEMATOCRIT: 37.5 % — AB (ref 39.0–52.0)
HEMOGLOBIN: 12.1 g/dL — AB (ref 13.0–17.0)
MCH: 27.5 pg (ref 26.0–34.0)
MCHC: 32.3 g/dL (ref 30.0–36.0)
MCV: 85.2 fL (ref 78.0–100.0)
Platelets: 194 10*3/uL (ref 150–400)
RBC: 4.4 MIL/uL (ref 4.22–5.81)
RDW: 15.4 % (ref 11.5–15.5)
WBC: 19.2 10*3/uL — ABNORMAL HIGH (ref 4.0–10.5)

## 2015-05-16 LAB — GLUCOSE, CAPILLARY
GLUCOSE-CAPILLARY: 129 mg/dL — AB (ref 65–99)
GLUCOSE-CAPILLARY: 124 mg/dL — AB (ref 65–99)
GLUCOSE-CAPILLARY: 110 mg/dL — AB (ref 65–99)
Glucose-Capillary: 112 mg/dL — ABNORMAL HIGH (ref 65–99)
Glucose-Capillary: 129 mg/dL — ABNORMAL HIGH (ref 65–99)
Glucose-Capillary: 167 mg/dL — ABNORMAL HIGH (ref 65–99)
Glucose-Capillary: 210 mg/dL — ABNORMAL HIGH (ref 65–99)

## 2015-05-16 LAB — HEPATIC FUNCTION PANEL
ALK PHOS: 132 U/L — AB (ref 38–126)
ALT: 193 U/L — AB (ref 17–63)
ALT: 184 U/L — AB (ref 17–63)
AST: 196 U/L — AB (ref 15–41)
AST: 127 U/L — ABNORMAL HIGH (ref 15–41)
Albumin: 3.4 g/dL — ABNORMAL LOW (ref 3.5–5.0)
Albumin: 3.3 g/dL — ABNORMAL LOW (ref 3.5–5.0)
Alkaline Phosphatase: 126 U/L (ref 38–126)
BILIRUBIN DIRECT: 3.5 mg/dL — AB (ref 0.1–0.5)
BILIRUBIN INDIRECT: 1.3 mg/dL — AB (ref 0.3–0.9)
BILIRUBIN INDIRECT: 1.8 mg/dL — AB (ref 0.3–0.9)
BILIRUBIN TOTAL: 3.9 mg/dL — AB (ref 0.3–1.2)
Bilirubin, Direct: 2.6 mg/dL — ABNORMAL HIGH (ref 0.1–0.5)
TOTAL PROTEIN: 6.7 g/dL (ref 6.5–8.1)
Total Bilirubin: 5.3 mg/dL — ABNORMAL HIGH (ref 0.3–1.2)
Total Protein: 6.8 g/dL (ref 6.5–8.1)

## 2015-05-16 LAB — MRSA PCR SCREENING: MRSA by PCR: NEGATIVE

## 2015-05-16 LAB — POTASSIUM: POTASSIUM: 4.6 mmol/L (ref 3.5–5.1)

## 2015-05-16 LAB — LACTIC ACID, PLASMA
LACTIC ACID, VENOUS: 2.8 mmol/L — AB (ref 0.5–2.0)
Lactic Acid, Venous: 1.8 mmol/L (ref 0.5–2.0)

## 2015-05-16 MED ORDER — LORAZEPAM 2 MG/ML IJ SOLN
1.0000 mg | Freq: Once | INTRAMUSCULAR | Status: AC | PRN
  Administered 2015-05-17: 1 mg via INTRAVENOUS
  Filled 2015-05-16: qty 1

## 2015-05-16 MED ORDER — SODIUM POLYSTYRENE SULFONATE 15 GM/60ML PO SUSP
45.0000 g | ORAL | Status: AC
  Administered 2015-05-16: 45 g via ORAL
  Filled 2015-05-16: qty 180

## 2015-05-16 MED ORDER — CETYLPYRIDINIUM CHLORIDE 0.05 % MT LIQD
7.0000 mL | Freq: Two times a day (BID) | OROMUCOSAL | Status: DC
  Administered 2015-05-16 – 2015-05-22 (×8): 7 mL via OROMUCOSAL

## 2015-05-16 MED ORDER — SODIUM CHLORIDE 0.9 % IV SOLN
INTRAVENOUS | Status: DC
  Administered 2015-05-16 – 2015-05-18 (×5): via INTRAVENOUS
  Administered 2015-05-19: 1000 mL via INTRAVENOUS

## 2015-05-16 MED ORDER — SODIUM CHLORIDE 0.9 % IV BOLUS (SEPSIS)
500.0000 mL | Freq: Once | INTRAVENOUS | Status: AC
  Administered 2015-05-16: 500 mL via INTRAVENOUS

## 2015-05-16 MED ORDER — SODIUM CHLORIDE 0.9 % IV SOLN
1.0000 g | Freq: Once | INTRAVENOUS | Status: AC
  Administered 2015-05-16: 1 g via INTRAVENOUS
  Filled 2015-05-16: qty 10

## 2015-05-16 MED ORDER — CHLORHEXIDINE GLUCONATE 0.12 % MT SOLN
15.0000 mL | Freq: Two times a day (BID) | OROMUCOSAL | Status: DC
  Administered 2015-05-16 – 2015-05-23 (×14): 15 mL via OROMUCOSAL
  Filled 2015-05-16 (×12): qty 15

## 2015-05-16 MED ORDER — DEXTROSE 50 % IV SOLN
1.0000 | Freq: Once | INTRAVENOUS | Status: AC
  Administered 2015-05-16: 50 mL via INTRAVENOUS
  Filled 2015-05-16: qty 50

## 2015-05-16 MED ORDER — SODIUM CHLORIDE 0.9 % IV BOLUS (SEPSIS)
1000.0000 mL | Freq: Once | INTRAVENOUS | Status: AC
Start: 2015-05-16 — End: 2015-05-16
  Administered 2015-05-16: 1000 mL via INTRAVENOUS

## 2015-05-16 MED ORDER — INSULIN ASPART 100 UNIT/ML IV SOLN
10.0000 [IU] | Freq: Once | INTRAVENOUS | Status: AC
  Administered 2015-05-16: 10 [IU] via INTRAVENOUS
  Filled 2015-05-16: qty 0.1

## 2015-05-16 MED ORDER — SODIUM CHLORIDE 0.9 % IV SOLN
1500.0000 mg | INTRAVENOUS | Status: DC
  Administered 2015-05-16: 1500 mg via INTRAVENOUS
  Filled 2015-05-16: qty 1500

## 2015-05-16 NOTE — Progress Notes (Signed)
Initial Nutrition Assessment  DOCUMENTATION CODES:   Obesity unspecified, Morbid obesity  INTERVENTION:  -Diet Advancement per MD -RD to continue to monitor for needs  NUTRITION DIAGNOSIS:   Inadequate oral intake related to poor appetite, acute illness as evidenced by per patient/family report.  GOAL:   Patient will meet greater than or equal to 90% of their needs  MONITOR:   Diet advancement, Labs, I & O's, Skin  REASON FOR ASSESSMENT:   Malnutrition Screening Tool    ASSESSMENT:   Joseph Clay is a 65 y.o. male who presents to the ED with 4 hour history of severe BLQ abdominal pain. Symptoms onset 4 hours ago, associated with nausea, has apparently completely resolved at this point in the ED.  Spoke with pt, daughter at bedside. Daughter provided conflicting statements with pt related to PO intake. Appears pt eats out at most meals, doesn't cook often, does not keep much food at home. Pt endorses ~10# wt loss over 3-4 days w/ no intake following onset of abdominal pain.   MD suspects for cholangitis vs CBD stone, related to jaundice and hypotension. MRCP/ERCP pending.  Nutrition-Focused physical exam completed. Findings are no fat depletion, mild muscle depletion, and no edema.   Labs: CBGs 112-210; K 6.3, BUN 22, Cr 1.64, Ca 8.4, EGFR 42, AST 194, ALT 196   Medications reviewed.  Diet Order:  Diet NPO time specified  Skin:  Reviewed, no issues  Last BM:  05/16/2015  Height:   Ht Readings from Last 1 Encounters:  05/15/15 6' 2"  (1.88 m)    Weight:   Wt Readings from Last 1 Encounters:  05/15/15 291 lb 7.2 oz (132.2 kg)    Ideal Body Weight:  86.36 kg  BMI:  Body mass index is 37.4 kg/(m^2).  Estimated Nutritional Needs:   Kcal:  2150-2600  Protein:  85-100 grams  Fluid:  >/= 2.2L  EDUCATION NEEDS:   No education needs identified at this time  Satira Anis. Alam Guterrez, MS, RD LDN After Hours/Weekend Pager [phone]

## 2015-05-16 NOTE — Progress Notes (Signed)
Patient Demographics  Joseph Clay, is a 65 y.o. male, DOB - Dec 30, 1950, BJY:[id number]  Admit date - 05/15/2015   Admitting Physician Hillary Bow, DO  Outpatient Primary MD for the patient is [name], MD  LOS - 1   Chief Complaint  Patient presents with  . Abdominal Pain       Admission HPI/Brief narrative: 65 year old male with past medical history of hyperlipidemia, arthritis, chronic back pain, COPD, diabetes mellitus, hypertension, sleep apnea, presents with complaints of crampy abdominal pain, septic in ED, hypotensive with leukocytosis and fever 103.1, has elevated LFTs, CT abdomen pelvis significant for cholelithiasis, abdominal ultrasound significant for cholelithiasis as well. Subjective:   Joseph Clay today has, No headache, No chest pain, or proximal nausea, crampy abdominal pain, fever overnight, denies any cough or shortness of breath.   Assessment & Plan    Principal Problem:   SIRS (systemic inflammatory response syndrome) (HCC) Active Problems:   HTN (hypertension)   DM type 2 (diabetes mellitus, type 2) (HCC)   Abdominal pain   Lactic acidosis   Hyperbilirubinemia   Transaminitis  Sepsis secondary to biliary tree disease cholangitis versus CBD stone - Follow on blood cultures, meanwhile continue with aggressive IV fluid resuscitation, trend lactic acid. - Continue spectrum antibiotics vancomycin and Zosyn, will DC vancomycin 24 hours if culture remains negative as most likely source is biliary. - GI consult greatly appreciated, source most likely to cholangitis versus CBD stone, unlikely acute cholecystitis as no right upper quadrant tenderness, MRCP is pending, low threshold  for ERCP. - Continue with IV fluids  Elevated LFTs - Please see above discussion  Hyperkalemia - Received D50, IV calcium gluconate, will give by mouth Kayexalate, will  recheck.  Diabetes mellitus - Hold oral hypoglycemic agent, continue with insulin sliding scale  Hyperlipidemia - Hold in the setting of elevated LFTs  Hypertension - Please hold home meds in the setting of hypotension and sepsis  Code Status: Full  Family Communication: None at bedside  Disposition Plan: Remains in stepdown   Procedures  None   Consults   Gastroenterology   Medications  Scheduled Meds: . antiseptic oral rinse  7 mL Mouth Rinse q12n4p  . chlorhexidine  15 mL Mouth Rinse BID  . enoxaparin (LOVENOX) injection  65 mg Subcutaneous Q24H  . insulin aspart  0-9 Units Subcutaneous 6 times per day  . piperacillin-tazobactam (ZOSYN)  IV  3.375 g Intravenous Q8H  . pravastatin  20 mg Oral QPM  . sodium chloride flush  3 mL Intravenous Q12H  . vancomycin  1,500 mg Intravenous Q24H   Continuous Infusions: . sodium chloride 150 mL/hr at 05/16/15 1058   PRN Meds:.acetaminophen **OR** acetaminophen  DVT Prophylaxis  Lovenox   Lab Results  Component Value Date   PLT 194 05/16/2015    Antibiotics   Anti-infectives    Start     Dose/Rate Route Frequency Ordered Stop   05/16/15 2000  vancomycin (VANCOCIN) 1,500 mg in sodium chloride 0.9 % 500 mL IVPB     1,500 mg 250 mL/hr over 120 Minutes Intravenous Every 24 hours 05/16/15 0935     05/16/15 1000  vancomycin (VANCOCIN) 1,250 mg in sodium chloride 0.9 % 250 mL IVPB  Status:  Discontinued  1,250 mg 166.7 mL/hr over 90 Minutes Intravenous Every 12 hours 05/15/15 2102 05/16/15 0935   05/16/15 0600  piperacillin-tazobactam (ZOSYN) IVPB 3.375 g     3.375 g 12.5 mL/hr over 240 Minutes Intravenous Every 8 hours 05/15/15 2100     05/15/15 2000  vancomycin (VANCOCIN) 2,000 mg in sodium chloride 0.9 % 500 mL IVPB     2,000 mg 250 mL/hr over 120 Minutes Intravenous  Once 05/15/15 1956 05/15/15 2300   05/15/15 2000  piperacillin-tazobactam (ZOSYN) IVPB 3.375 g     3.375 g 100 mL/hr over 30 Minutes Intravenous   Once 05/15/15 1956 05/16/15 0019          Objective:   Filed Vitals:   05/16/15 0700 05/16/15 0800 05/16/15 0900 05/16/15 1000  BP: 88/44 110/57 99/41 81/42   Pulse: 82 86 88 79  Temp:  99.4 F (37.4 C)    TempSrc:  Axillary    Resp: 32 37 38 26  Height:      Weight:      SpO2: 98% 100% 96% 96%    Wt Readings from Last 3 Encounters:  05/15/15 132.2 kg (291 lb 7.2 oz)  03/25/13 133.811 kg (295 lb)  06/16/11 132.904 kg (293 lb)     Intake/Output Summary (Last 24 hours) at 05/16/15 1112 Last data filed at 05/16/15 1000  Gross per 24 hour  Intake 3365.21 ml  Output   1050 ml  Net 2315.21 ml     Physical Exam  Awake Alert, Oriented X 3, No new F.N deficits, Normal affect [place].AT,PERRAL Supple Neck,No JVD,  Symmetrical Chest wall movement, Good air movement bilaterally, CTAB RRR,No Gallops,Rubs or new Murmurs, No Parasternal Heave +ve B.Sounds, Abd Soft, No tenderness, No rebound - guarding or rigidity. No Cyanosis, Clubbing or edema, No new Rash or bruise     Data Review   Micro Results Recent Results (from the past 240 hour(s))  MRSA PCR Screening     Status: None   Collection Time: 05/15/15 11:45 PM  Result Value Ref Range Status   MRSA by PCR NEGATIVE NEGATIVE Final    Comment:        The GeneXpert MRSA Assay (FDA approved for NASAL specimens only), is one component of a comprehensive MRSA colonization surveillance program. It is not intended to diagnose MRSA infection nor to guide or monitor treatment for MRSA infections.     Radiology Reports Ct Abdomen Pelvis W Contrast  05/15/2015  CLINICAL DATA:  Mid abdominal pain and cramping. Shortness of breath. EXAM: CT ABDOMEN AND PELVIS WITH CONTRAST TECHNIQUE: Multidetector CT imaging of the abdomen and pelvis was performed using the standard protocol following bolus administration of intravenous contrast. CONTRAST:  OMNIPAQUE IOHEXOL 300 MG/ML  SOLN COMPARISON:  05/31/2012 FINDINGS: Lower chest:   No acute findings. Hepatobiliary: 1.3 cm calcified gallstone without significant gallbladder distention or wall thickening. Normal appearance of liver and the portal venous system is patent. Pancreas: Normal appearance of the pancreas without inflammation or duct dilatation. Spleen: Normal appearance of spleen without enlargement. Adrenals/Urinary Tract: Many of the images are limited by motion artifact. There is a stable exophytic 1.1 cm low-density structure in the lateral right kidney which probably represents a small exophytic cyst. Question another small cyst in the anterior right kidney. Normal appearance of the adrenal glands. No hydronephrosis or kidney stones. Normal appearance of the urinary bladder. Probable small cyst in the anterior left kidney that is too small to definitively characterize. Stomach/Bowel: Scattered colonic diverticular without acute  inflammation. Mild dilatation of proximal jejunum. No evidence for obstruction. Vascular/Lymphatic: Again noted are prominent peri-aortic lymph nodes. Index lesion anterior to the aorta measures 1.3 x 1.1 cm on sequence 2, image 41 and previously measured 1.2 x 1.1 cm. The mild retroperitoneal lymphadenopathy has not significantly changed from the prior examination. Atherosclerotic calcifications in the abdominal aorta without aneurysm. Reproductive: Prostate is large for size measuring 5.5 x 6.5 cm on sequence 2, image 87. Prostate is nodular along the bladder base. Calcifications along the posterior aspect of the prostate. Other: No free fluid. Musculoskeletal: Multilevel degenerative facet disease in lumbar spine. No acute bone abnormality. IMPRESSION: No acute abnormality in the abdomen or pelvis. Cholelithiasis.  No evidence for gallbladder inflammation. Prostate hypertrophy.  Recommend correlation with PSA level. Electronically Signed   By: Richarda Overlie M.D.   On: 05/15/2015 19:30   Dg Chest Port 1 View  05/15/2015  CLINICAL DATA:  Patient greater  mid abdominal pain and cramping. Some shortness of breath. Former smoker. History of hypertension and diabetes. EXAM: PORTABLE CHEST 1 VIEW COMPARISON:  03/25/2013 FINDINGS: There is mild opacity at the lung bases most likely atelectasis. No evidence of pulmonary edema. No apparent pleural effusion and no evidence of a pneumothorax. Cardiac silhouette is normal in size. No mediastinal or hilar masses or evidence of adenopathy. Bony thorax is grossly intact. IMPRESSION: 1. No acute cardiopulmonary disease. 2. Mild lung base atelectasis. Electronically Signed   By: Amie Portland M.D.   On: 05/15/2015 21:14   US Abdomen Limited Ruq  05/15/2015  CLINICAL DATA:  Abdominal pain, cramping. Elevated liver function tests. EXAM: US ABDOMEN LIMITED - RIGHT UPPER QUADRANT COMPARISON:  CT abdomen and pelvis May 15, 2015 at 1913 hours FINDINGS: Limited assessment due to large body habitus. Gallbladder: Echogenic gallstone with acoustic shadowing. Gallbladder wall is mildly thickened at 3-4 mm. Trace pericholecystic fluid. No sonographic Murphy's sign elicited. Common bile duct: Diameter: 3.4 mm. Liver: No focal lesion identified. Diffusely echogenic. Hepatopetal portal vein. IMPRESSION: Habitus limited examination. Cholelithiasis and sonographic findings of mild cholecystitis though, no sonographic Murphy's sign elicited. Hepatic steatosis. Electronically Signed   By: Awilda Metro M.D.   On: 05/15/2015 21:12     CBC  Recent Labs Lab 05/15/15 1832 05/15/15 2043 05/16/15 0348  WBC  --  15.2* 19.2*  HGB 16.0 13.0 12.1*  HCT 47.0 40.1 37.5*  PLT  --  191 194  MCV  --  84.1 85.2  MCH  --  27.3 27.5  MCHC  --  32.4 32.3  RDW  --  14.9 15.4  LYMPHSABS  --  0.6*  --   MONOABS  --  0.5  --   EOSABS  --  0.0  --   BASOSABS  --  0.0  --     Chemistries   Recent Labs Lab 05/15/15 1825 05/15/15 1832 05/16/15 0348  NA 139 136 138  K 5.4* 5.2* 6.3*  CL 100* 100* 104  CO2 20*  --  25  GLUCOSE 268*  268* 179*  BUN 22* 31* 22*  CREATININE 1.13 1.10 1.64*  CALCIUM 10.2  --  8.4*  AST 210*  --  196*  194*  ALT 149*  --  193*  196*  ALKPHOS 171*  --  132*  135*  BILITOT 4.3*  --  3.9*  4.2*   ------------------------------------------------------------------------------------------------------------------ estimated creatinine clearance is 64 mL/min (by C-G formula based on Cr of 1.64). ------------------------------------------------------------------------------------------------------------------ No results for input(s): HGBA1C in the  last 72 hours. ------------------------------------------------------------------------------------------------------------------ No results for input(s): CHOL, HDL, LDLCALC, TRIG, CHOLHDL, LDLDIRECT in the last 72 hours. ------------------------------------------------------------------------------------------------------------------ No results for input(s): TSH, T4TOTAL, T3FREE, THYROIDAB in the last 72 hours.  Invalid input(s): FREET3 ------------------------------------------------------------------------------------------------------------------ No results for input(s): VITAMINB12, FOLATE, FERRITIN, TIBC, IRON, RETICCTPCT in the last 72 hours.  Coagulation profile No results for input(s): INR, PROTIME in the last 168 hours.  No results for input(s): DDIMER in the last 72 hours.  Cardiac Enzymes No results for input(s): CKMB, TROPONINI, MYOGLOBIN in the last 168 hours.  Invalid input(s): CK ------------------------------------------------------------------------------------------------------------------ Invalid input(s): POCBNP     Time Spent in minutes   35 minutes   ELGERGAWY, DAWOOD M.D on 05/16/2015 at 11:12 AM  Between 7am to 7pm - Pager - [phone]  After 7pm go to [email or web address] - password [facility]  [facility]   Office  [phone]

## 2015-05-16 NOTE — Progress Notes (Signed)
Pharmacy Antibiotic Note  Joseph Clay is a 65 y.o. male admitted on 05/15/2015 with sepsis.  Pharmacy has been consulted for vancomycin and Zosyn dosing.  Plan: Zosyn 3.375gm IV q8h (4hr extended infusions) Decrease to vancomycin 1500 mg IV q24h Check trough at steady state Follow up renal function & cultures, clinical course  Height:  (188 cm) Weight: 291 lb 7.2 oz (132.2 kg) IBW/kg (Calculated) : 82.2  Temp (24hrs), Avg:100.1 F (37.8 C), Min:97.5 F (36.4 C), Max:103.1 F (39.5 C)   Recent Labs Lab 05/15/15 1825 05/15/15 1832 05/15/15 1833 05/15/15 2043 05/16/15 0340 05/16/15 0348  WBC  --   --   --  15.2*  --  19.2*  CREATININE 1.13 1.10  --   --   --  1.64*  LATICACIDVEN  --   --  5.32* 3.7* 2.8*  --     Estimated Creatinine Clearance: 64 mL/min (by C-G formula based on Cr of 1.64).   2/1 CrCl ~ 64 ml/min CG (~ 45 ml/min N)  No Active Allergies  Antimicrobials this admission: 1/31 >> vancomycin >> 1/31 >> zosyn >>  Dose adjustments this admission: 2/1 SCr increased (s/p IV contrast 1/31 PM.  IVF increased and lisinopril held 2/1) and vancomycin dose empirically decreased per dosing nomogram.  Microbiology results: 1/31 BCx: sent 1/31 MRSA PCR: negative  Thank you for allowing pharmacy to be a part of this patient's care.  Lynann Beaver PharmD, BCPS Pager [phone] 05/16/2015 9:40 AM

## 2015-05-16 NOTE — Progress Notes (Signed)
CPAP qhs set-up for Pt.  Pt states he is not feeling sleepy at this time but will contact RT when he is ready to go on CPAP.  RT will continue to monitor as needed.  RN aware.

## 2015-05-16 NOTE — Care Management Note (Signed)
Case Management Note  Patient Details  Name: RESHARD GUILLET MRN: [id number] Date of Birth: 01-Mar-1951  Subjective/Objective:           Sepsis and sirs         Action/Plan:Date: May 16, 2015 Chart reviewed for concurrent status and case management needs. Will continue to follow patient for changes and needs: Marcelle Smiling, BSN, RN, [place]   [phone]   Expected Discharge Date:                  Expected Discharge Plan:  Home/Self Care  In-House Referral:  NA  Discharge planning Services  CM Consult  Post Acute Care Choice:  NA Choice offered to:  NA  DME Arranged:    DME Agency:     HH Arranged:    HH Agency:     Status of Service:  Completed, signed off  Medicare Important Message Given:    Date Medicare IM Given:    Medicare IM give by:    Date Additional Medicare IM Given:    Additional Medicare Important Message give by:     If discussed at Long Length of Stay Meetings, dates discussed:    Additional Comments:  Golda Acre, RN 05/16/2015, 10:30 AM

## 2015-05-16 NOTE — Consult Note (Signed)
Referring Provider: Dr. Julian Reil Primary Care Physician:  [name], MD Primary Gastroenterologist:  Gentry Fitz  Reason for Consultation:  Elevated LFTs; Abdominal pain  HPI: Joseph Clay is a 65 y.o. male with acute onset of generalized abdominal cramping last evening that lasted for hours with associated chills. Denies fever/N/V/weakness/lightheadedness/chest pain/shortness of breath. Thinks he may have had similar pains years ago but not sure. T 103 in ER and hypotensive 60's - 90's systolic/ 30's - 14'N diastolic. WBC 19. TB 4.2, ALP 135, AST 194, ALT 196. Lipase normal (32). U/S showed GS and question of mild cholecystitis. CT scan showed gallstones without cholecystitis. Normal pancreas. No CBD dilation.Currently on IV Zosyn. Denies any abdominal pain right now.     Past Medical History  Diagnosis Date  . Arthritis   . Hyperlipidemia     takes Pravastatin daily  . Bronchitis     couple of months ago  . Joint pain     right shoulder and right knee  . Chronic back pain     bulding disc  . Hx of colonic polyps   . Chronic kidney disease     bladder outlet obstruction-  RECENT HOSPITALIZATION WITH ACUTE RENAL FAILURE 2/13-      recent scans in [facility] from 2/13-  . Diabetes mellitus     takes Metformin bid  . Hypertension     EKG, Chest 2/13 EP[facility]- followed by Dr Roseanne Reno, labs 05/31/11  epic  . Sleep apnea     CPAP-sleep study done 2months ago/ UNABLE TO LOCATE REPORT /  states [facility]- doesnt know settings    Past Surgical History  Procedure Laterality Date  . Tonsillectomy      as a child  . Colonscopy    . Rt shoulder surgery      bone spur,scrape rotor cuff  . Shoulder arthroscopy      right   05/23/11/   [place]  . Cystoscopy  06/16/2011    Procedure: CYSTOSCOPY;  Surgeon: Crecencio Mc, MD;  Location: [facility];  Service: Urology;  Laterality: N/A;       . Transurethral resection of prostate  06/16/2011    Procedure: TRANSURETHRAL RESECTION OF THE PROSTATE  (TURP);  Surgeon: Crecencio Mc, MD;  Location: [facility];  Service: Urology;  Laterality: N/A;    Prior to Admission medications   Medication Sig Start Date End Date Taking? Authorizing Provider  amLODipine (NORVASC) 5 MG tablet Take 5 mg by mouth daily.    Yes Historical Provider, MD  calcium carbonate (OS-CAL) 600 MG TABS Take 600 mg by mouth every evening.    Yes Historical Provider, MD  docusate sodium (COLACE) 50 MG capsule Take 50 mg by mouth 2 (two) times daily.   Yes Historical Provider, MD  furosemide (LASIX) 40 MG tablet Take 40 mg by mouth daily.    Yes Historical Provider, MD  lisinopril (PRINIVIL,ZESTRIL) 20 MG tablet Take 20 mg by mouth daily.   Yes Historical Provider, MD  metFORMIN (GLUCOPHAGE) 1000 MG tablet Take 1,000 mg by mouth 2 (two) times daily. 05/05/15  Yes Historical Provider, MD  potassium chloride SA (K-DUR,KLOR-CON) 20 MEQ tablet Take 20 mEq by mouth daily.    Yes Historical Provider, MD  pravastatin (PRAVACHOL) 40 MG tablet Take 20 mg by mouth every evening.    Yes Historical Provider, MD    Scheduled Meds: . antiseptic oral rinse  7 mL Mouth Rinse q12n4p  . chlorhexidine  15 mL Mouth Rinse BID  .  enoxaparin (LOVENOX) injection  65 mg Subcutaneous Q24H  . insulin aspart  0-9 Units Subcutaneous 6 times per day  . piperacillin-tazobactam (ZOSYN)  IV  3.375 g Intravenous Q8H  . pravastatin  20 mg Oral QPM  . sodium chloride flush  3 mL Intravenous Q12H  . vancomycin  1,500 mg Intravenous Q24H   Continuous Infusions: . sodium chloride 150 mL/hr at 05/16/15 0508   PRN Meds:.acetaminophen **OR** acetaminophen  Allergies as of 05/15/2015  . (No Active Allergies)    Family History  Problem Relation Age of Onset  . Anesthesia problems Neg Hx   . Hypotension Neg Hx   . Malignant hyperthermia Neg Hx   . Pseudochol deficiency Neg Hx     Social History   Social History  . Marital Status: Single    Spouse Name: N/A  . Number of Children: N/A  . Years of  Education: N/A   Occupational History  . Not on file.   Social History Main Topics  . Smoking status: Former Games developer  . Smokeless tobacco: Not on file     Comment: quit 20+yrs ago  . Alcohol Use: No     Comment: occasionally   . Drug Use: No  . Sexual Activity: No   Other Topics Concern  . Not on file   Social History Narrative    Review of Systems: All negative except as stated above in HPI.  Physical Exam: Vital signs: Filed Vitals:   05/16/15 0800 05/16/15 0900  BP: 110/57 99/41  Pulse: 86 88  Temp: 99.4 F (37.4 C)   Resp: 37 38   Last BM Date: 05/16/15 General:   Lethargic, Obese, pleasant and cooperative in NAD HEENT: +scleral icterus, oropharynx clear Neck: supple, nontender Lungs:  Clear throughout to auscultation.   No wheezes, crackles, or rhonchi. No acute distress. Heart:  Regular rate and rhythm; no murmurs, clicks, rubs,  or gallops. Abdomen: soft, nontender, nondistended, +BS  Rectal:  Deferred Ext: no edema  GI:  Lab Results:  Recent Labs  05/15/15 1832 05/15/15 2043 05/16/15 0348  WBC  --  15.2* 19.2*  HGB 16.0 13.0 12.1*  HCT 47.0 40.1 37.5*  PLT  --  191 194   BMET  Recent Labs  05/15/15 1825 05/15/15 1832 05/16/15 0348  NA 139 136 138  K 5.4* 5.2* 6.3*  CL 100* 100* 104  CO2 20*  --  25  GLUCOSE 268* 268* 179*  BUN 22* 31* 22*  CREATININE 1.13 1.10 1.64*  CALCIUM 10.2  --  8.4*   LFT  Recent Labs  05/16/15 0348  PROT 6.8  6.9  ALBUMIN 3.4*  3.6  AST 196*  194*  ALT 193*  196*  ALKPHOS 132*  135*  BILITOT 3.9*  4.2*  BILIDIR 2.6*  IBILI 1.3*   PT/INR No results for input(s): LABPROT, INR in the last 72 hours.   Studies/Results: Ct Abdomen Pelvis W Contrast  05/15/2015  CLINICAL DATA:  Mid abdominal pain and cramping. Shortness of breath. EXAM: CT ABDOMEN AND PELVIS WITH CONTRAST TECHNIQUE: Multidetector CT imaging of the abdomen and pelvis was performed using the standard protocol following bolus  administration of intravenous contrast. CONTRAST:  OMNIPAQUE IOHEXOL 300 MG/ML  SOLN COMPARISON:  05/31/2012 FINDINGS: Lower chest:  No acute findings. Hepatobiliary: 1.3 cm calcified gallstone without significant gallbladder distention or wall thickening. Normal appearance of liver and the portal venous system is patent. Pancreas: Normal appearance of the pancreas without inflammation or duct dilatation. Spleen:  Normal appearance of spleen without enlargement. Adrenals/Urinary Tract: Many of the images are limited by motion artifact. There is a stable exophytic 1.1 cm low-density structure in the lateral right kidney which probably represents a small exophytic cyst. Question another small cyst in the anterior right kidney. Normal appearance of the adrenal glands. No hydronephrosis or kidney stones. Normal appearance of the urinary bladder. Probable small cyst in the anterior left kidney that is too small to definitively characterize. Stomach/Bowel: Scattered colonic diverticular without acute inflammation. Mild dilatation of proximal jejunum. No evidence for obstruction. Vascular/Lymphatic: Again noted are prominent peri-aortic lymph nodes. Index lesion anterior to the aorta measures 1.3 x 1.1 cm on sequence 2, image 41 and previously measured 1.2 x 1.1 cm. The mild retroperitoneal lymphadenopathy has not significantly changed from the prior examination. Atherosclerotic calcifications in the abdominal aorta without aneurysm. Reproductive: Prostate is large for size measuring 5.5 x 6.5 cm on sequence 2, image 87. Prostate is nodular along the bladder base. Calcifications along the posterior aspect of the prostate. Other: No free fluid. Musculoskeletal: Multilevel degenerative facet disease in lumbar spine. No acute bone abnormality. IMPRESSION: No acute abnormality in the abdomen or pelvis. Cholelithiasis.  No evidence for gallbladder inflammation. Prostate hypertrophy.  Recommend correlation with PSA level.  Electronically Signed   By: Richarda Overlie M.D.   On: 05/15/2015 19:30   Dg Chest Port 1 View  05/15/2015  CLINICAL DATA:  Patient greater mid abdominal pain and cramping. Some shortness of breath. Former smoker. History of hypertension and diabetes. EXAM: PORTABLE CHEST 1 VIEW COMPARISON:  03/25/2013 FINDINGS: There is mild opacity at the lung bases most likely atelectasis. No evidence of pulmonary edema. No apparent pleural effusion and no evidence of a pneumothorax. Cardiac silhouette is normal in size. No mediastinal or hilar masses or evidence of adenopathy. Bony thorax is grossly intact. IMPRESSION: 1. No acute cardiopulmonary disease. 2. Mild lung base atelectasis. Electronically Signed   By: Amie Portland M.D.   On: 05/15/2015 21:14   US Abdomen Limited Ruq  05/15/2015  CLINICAL DATA:  Abdominal pain, cramping. Elevated liver function tests. EXAM: US ABDOMEN LIMITED - RIGHT UPPER QUADRANT COMPARISON:  CT abdomen and pelvis May 15, 2015 at 1913 hours FINDINGS: Limited assessment due to large body habitus. Gallbladder: Echogenic gallstone with acoustic shadowing. Gallbladder wall is mildly thickened at 3-4 mm. Trace pericholecystic fluid. No sonographic Murphy's sign elicited. Common bile duct: Diameter: 3.4 mm. Liver: No focal lesion identified. Diffusely echogenic. Hepatopetal portal vein. IMPRESSION: Habitus limited examination. Cholelithiasis and sonographic findings of mild cholecystitis though, no sonographic Murphy's sign elicited. Hepatic steatosis. Electronically Signed   By: Awilda Metro M.D.   On: 05/15/2015 21:12    Impression/Plan: 65 yo with hypotensive, fever, jaundice and abdominal pain concerning for cholangitis vs. CBD stone. Doubt acute cholecystitis. Afebrile and nontender now. Will do MRCP and have a low threshold for an ERCP pending MRCP results and hemodynamics. Follow LFTs. Continue supportive care. IV Abx. IVFs. NPO. ERCP risks/benefits discussed in case it is necessary  to do. Await MRCP results.    LOS: 1 day   Emanuel Dowson C.  05/16/2015, 9:36 AM  Pager [phone]  If no answer or after 5 PM call [phone]

## 2015-05-16 NOTE — Progress Notes (Signed)
Critical Lactic Acid of 2.8 and Potassium of 6.3 called to NP on call. Orders received and implemented. Will continue to monitor.

## 2015-05-16 NOTE — Progress Notes (Signed)
CRITICAL VALUE ALERT  Critical value received: positive blood culture-anaerobic bottle with gram neg rods  Date of notification:  05/16/2015 Time of notification:  2100  Critical value read back:Yes.    Nurse who received alert:  c Wellington Winegarden  MD notified (1st page):  [facility] NP  Time of first page: 2300 MD notified (2nd page):  Time of second page:  Responding MD: [facility] NP  Time MD responded: 2320

## 2015-05-17 ENCOUNTER — Encounter (HOSPITAL_COMMUNITY)
Admission: EM | Disposition: A | Discharge status: Home Health | Payer: Self-pay | Source: Home / Self Care | Attending: Internal Medicine

## 2015-05-17 ENCOUNTER — Inpatient Hospital Stay (HOSPITAL_COMMUNITY): Payer: Medicare Other | Admitting: Anesthesiology

## 2015-05-17 ENCOUNTER — Inpatient Hospital Stay (HOSPITAL_COMMUNITY): Payer: Medicare Other

## 2015-05-17 ENCOUNTER — Encounter (HOSPITAL_COMMUNITY): Payer: Self-pay

## 2015-05-17 ENCOUNTER — Inpatient Hospital Stay (HOSPITAL_COMMUNITY)
Admit: 2015-05-17 | Discharge: 2015-05-17 | Disposition: A | Discharge status: Home / Self Care | Payer: Medicare Other | Attending: Internal Medicine | Admitting: Internal Medicine

## 2015-05-17 HISTORY — PX: ERCP: SHX5425

## 2015-05-17 LAB — COMPREHENSIVE METABOLIC PANEL
ALBUMIN: 3.1 g/dL — AB (ref 3.5–5.0)
ALK PHOS: 118 U/L (ref 38–126)
ALT: 152 U/L — AB (ref 17–63)
ANION GAP: 11 (ref 5–15)
AST: 87 U/L — ABNORMAL HIGH (ref 15–41)
BUN: 11 mg/dL (ref 6–20)
CALCIUM: 8.5 mg/dL — AB (ref 8.9–10.3)
CHLORIDE: 107 mmol/L (ref 101–111)
CO2: 23 mmol/L (ref 22–32)
CREATININE: 1.03 mg/dL (ref 0.61–1.24)
GFR calc Af Amer: >60 mL/min (ref >60)
GFR calc non Af Amer: >60 mL/min (ref >60)
GLUCOSE: 160 mg/dL — AB (ref 65–99)
Potassium: 4.2 mmol/L (ref 3.5–5.1)
SODIUM: 141 mmol/L (ref 135–145)
Total Bilirubin: 5.9 mg/dL — ABNORMAL HIGH (ref 0.3–1.2)
Total Protein: 6.3 g/dL — ABNORMAL LOW (ref 6.5–8.1)

## 2015-05-17 LAB — CBC
HCT: 34.7 % — ABNORMAL LOW (ref 39.0–52.0)
HEMOGLOBIN: 11.2 g/dL — AB (ref 13.0–17.0)
MCH: 27.7 pg (ref 26.0–34.0)
MCHC: 32.3 g/dL (ref 30.0–36.0)
MCV: 85.7 fL (ref 78.0–100.0)
Platelets: 157 10*3/uL (ref 150–400)
RBC: 4.05 MIL/uL — ABNORMAL LOW (ref 4.22–5.81)
RDW: 15.5 % (ref 11.5–15.5)
WBC: 9.5 10*3/uL (ref 4.0–10.5)

## 2015-05-17 LAB — GLUCOSE, CAPILLARY
GLUCOSE-CAPILLARY: 135 mg/dL — AB (ref 65–99)
GLUCOSE-CAPILLARY: 161 mg/dL — AB (ref 65–99)
Glucose-Capillary: 138 mg/dL — ABNORMAL HIGH (ref 65–99)
Glucose-Capillary: 156 mg/dL — ABNORMAL HIGH (ref 65–99)
Glucose-Capillary: 156 mg/dL — ABNORMAL HIGH (ref 65–99)
Glucose-Capillary: 145 mg/dL — ABNORMAL HIGH (ref 65–99)

## 2015-05-17 LAB — BILIRUBIN, DIRECT: Bilirubin, Direct: 3.1 mg/dL — ABNORMAL HIGH (ref 0.1–0.5)

## 2015-05-17 LAB — PROTIME-INR
INR: 1.17 (ref 0.00–1.49)
PROTHROMBIN TIME: 15.1 s (ref 11.6–15.2)

## 2015-05-17 SURGERY — ERCP, WITH INTERVENTION IF INDICATED
Anesthesia: Moderate Sedation

## 2015-05-17 SURGERY — ERCP, WITH INTERVENTION IF INDICATED
Anesthesia: General

## 2015-05-17 MED ORDER — PANTOPRAZOLE SODIUM 40 MG IV SOLR
40.0000 mg | Freq: Two times a day (BID) | INTRAVENOUS | Status: DC
  Administered 2015-05-17 – 2015-05-23 (×11): 40 mg via INTRAVENOUS
  Filled 2015-05-17 (×11): qty 40

## 2015-05-17 MED ORDER — PROPOFOL 10 MG/ML IV BOLUS
INTRAVENOUS | Status: DC | PRN
  Administered 2015-05-17: 50 mg via INTRAVENOUS
  Administered 2015-05-17: 200 mg via INTRAVENOUS

## 2015-05-17 MED ORDER — GLUCAGON HCL RDNA (DIAGNOSTIC) 1 MG IJ SOLR
INTRAMUSCULAR | Status: AC
  Filled 2015-05-17: qty 1

## 2015-05-17 MED ORDER — SUGAMMADEX SODIUM 500 MG/5ML IV SOLN
INTRAVENOUS | Status: DC | PRN
  Administered 2015-05-17: 550 mg via INTRAVENOUS

## 2015-05-17 MED ORDER — MIDAZOLAM HCL 2 MG/2ML IJ SOLN
INTRAMUSCULAR | Status: AC
  Filled 2015-05-17: qty 2

## 2015-05-17 MED ORDER — FAMOTIDINE IN NACL 20-0.9 MG/50ML-% IV SOLN
20.0000 mg | Freq: Once | INTRAVENOUS | Status: AC
  Administered 2015-05-17: 20 mg via INTRAVENOUS
  Filled 2015-05-17: qty 50

## 2015-05-17 MED ORDER — INDOMETHACIN 50 MG RE SUPP
RECTAL | Status: AC
  Filled 2015-05-17: qty 2

## 2015-05-17 MED ORDER — FENTANYL CITRATE (PF) 100 MCG/2ML IJ SOLN
INTRAMUSCULAR | Status: DC | PRN
Start: 2015-05-17 — End: 2015-05-17
  Administered 2015-05-17 (×2): 100 ug via INTRAVENOUS

## 2015-05-17 MED ORDER — ONDANSETRON HCL 4 MG/2ML IJ SOLN
INTRAMUSCULAR | Status: DC | PRN
  Administered 2015-05-17: 4 mg via INTRAVENOUS

## 2015-05-17 MED ORDER — HYDRALAZINE HCL 20 MG/ML IJ SOLN
5.0000 mg | Freq: Four times a day (QID) | INTRAMUSCULAR | Status: DC | PRN
  Administered 2015-05-18: 5 mg via INTRAVENOUS
  Filled 2015-05-17 (×2): qty 1

## 2015-05-17 MED ORDER — SUGAMMADEX SODIUM 500 MG/5ML IV SOLN: INTRAVENOUS | Status: DC | PRN

## 2015-05-17 MED ORDER — FENTANYL CITRATE (PF) 100 MCG/2ML IJ SOLN
INTRAMUSCULAR | Status: AC
  Filled 2015-05-17: qty 2

## 2015-05-17 MED ORDER — SUCCINYLCHOLINE CHLORIDE 20 MG/ML IJ SOLN
INTRAMUSCULAR | Status: DC | PRN
  Administered 2015-05-17: 100 mg via INTRAVENOUS

## 2015-05-17 MED ORDER — MIDAZOLAM HCL 5 MG/5ML IJ SOLN
INTRAMUSCULAR | Status: DC | PRN
  Administered 2015-05-17: 2 mg via INTRAVENOUS

## 2015-05-17 MED ORDER — PROPOFOL 10 MG/ML IV BOLUS
INTRAVENOUS | Status: AC
  Filled 2015-05-17: qty 20

## 2015-05-17 MED ORDER — ROCURONIUM BROMIDE 100 MG/10ML IV SOLN
INTRAVENOUS | Status: DC | PRN
  Administered 2015-05-17: 50 mg via INTRAVENOUS

## 2015-05-17 MED ORDER — GADOBENATE DIMEGLUMINE 529 MG/ML IV SOLN
20.0000 mL | Freq: Once | INTRAVENOUS | Status: AC | PRN
  Administered 2015-05-17: 20 mL via INTRAVENOUS

## 2015-05-17 MED ORDER — SUGAMMADEX SODIUM 500 MG/5ML IV SOLN
INTRAVENOUS | Status: AC
  Filled 2015-05-17: qty 5

## 2015-05-17 MED ORDER — PHENYLEPHRINE HCL 10 MG/ML IJ SOLN
INTRAMUSCULAR | Status: DC | PRN
  Administered 2015-05-17: 80 ug via INTRAVENOUS
  Administered 2015-05-17: 100 ug via INTRAVENOUS
  Administered 2015-05-17: 120 ug via INTRAVENOUS
  Administered 2015-05-17: 80 ug via INTRAVENOUS
  Administered 2015-05-17: 100 ug via INTRAVENOUS

## 2015-05-17 MED ORDER — LIDOCAINE HCL (CARDIAC) 20 MG/ML IV SOLN
INTRAVENOUS | Status: DC | PRN
  Administered 2015-05-17: 100 mg via INTRAVENOUS

## 2015-05-17 MED ORDER — INDOMETHACIN 50 MG RE SUPP
100.0000 mg | Freq: Once | RECTAL | Status: AC
  Administered 2015-05-17: 100 mg via RECTAL

## 2015-05-17 MED ORDER — SODIUM CHLORIDE 0.9 % IV SOLN
INTRAVENOUS | Status: DC | PRN
  Administered 2015-05-17: 80 mL

## 2015-05-17 NOTE — Progress Notes (Signed)
Dr. Berneice Heinrich by to see pt. Made aware of SBP 170-180's. No new orders at this time. Dr. Berneice Heinrich made aware of respirations in 30's and this was how it was preop. No new orders at this time. Pt denies SOB or trouble breathing.

## 2015-05-17 NOTE — Anesthesia Postprocedure Evaluation (Signed)
Anesthesia Post Note  Patient: CHORD TAKAHASHI  Procedure(s) Performed: Procedure(s) (LRB): ENDOSCOPIC RETROGRADE CHOLANGIOPANCREATOGRAPHY (ERCP) (N/A)  Patient location during evaluation: PACU Anesthesia Type: General Level of consciousness: awake and alert Pain management: pain level controlled Vital Signs Assessment: post-procedure vital signs reviewed and stable Respiratory status: spontaneous breathing, nonlabored ventilation, respiratory function stable and patient connected to nasal cannula oxygen Cardiovascular status: blood pressure returned to baseline and stable Postop Assessment: no signs of nausea or vomiting Anesthetic complications: no    Last Vitals:  Filed Vitals:   05/17/15 1255 05/17/15 1300  BP: 194/84 176/83  Pulse: 103 99  Temp:    Resp: 30 25    Last Pain:  Filed Vitals:   05/17/15 1301  PainSc: 0-No pain                 Cassadie Pankonin

## 2015-05-17 NOTE — Transfer of Care (Signed)
Immediate Anesthesia Transfer of Care Note  Patient: Joseph Clay  Procedure(s) Performed: Procedure(s): ENDOSCOPIC RETROGRADE CHOLANGIOPANCREATOGRAPHY (ERCP) (N/A)  Patient Location: PACU  Anesthesia Type:General  Level of Consciousness: awake, alert  and oriented  Airway & Oxygen Therapy: Patient Spontanous Breathing and Patient connected to face mask oxygen  Post-op Assessment: Report given to RN and Post -op Vital signs reviewed and stable  Post vital signs: Reviewed and stable  Last Vitals:  Filed Vitals:   05/17/15 0802 05/17/15 1022  BP: 137/64 144/71  Pulse: 93 93  Temp:  37.2 C  Resp: 39 36    Complications: No apparent anesthesia complications

## 2015-05-17 NOTE — Progress Notes (Signed)
Joseph Clay 10:35 AM  Subjective: Patient seen and examined and hospital computer chart reviewed and case discussed with my partner Dr. Bosie Clos and his labs and CT ultrasound and MRI was reviewed and currently is doing well without any complaints  Objective: Vital signs stable afebrile no acute distress exam please see preassessment evaluation labs and x-rays reviewed as above  Assessment: Questionable CBD stone with cholangitis versus cholecystitis  Plan: The risks benefits methods and success rate of ERCP was discussed by me as well and will proceed this morning with anesthesia assistance and probable surgical consult for laparoscopic cholecystectomy afterward  [facility]  Pager [phone] After 5PM or if no answer call [phone]

## 2015-05-17 NOTE — Anesthesia Procedure Notes (Signed)
Procedure Name: Intubation Performed by: Kizzie Fantasia Pre-anesthesia Checklist: Patient identified, Emergency Drugs available, Suction available, Patient being monitored and Timeout performed Patient Re-evaluated:Patient Re-evaluated prior to inductionOxygen Delivery Method: Circle system utilized Preoxygenation: Pre-oxygenation with 100% oxygen Intubation Type: IV induction Ventilation: Mask ventilation without difficulty Laryngoscope Size: Glidescope Grade View: Grade II Tube type: Oral Tube size: 7.5 mm Number of attempts: 2 Airway Equipment and Method: Stylet Placement Confirmation: ETT inserted through vocal cords under direct vision,  positive ETCO2,  CO2 detector and breath sounds checked- equal and bilateral Secured at: 23 cm Tube secured with: Tape Dental Injury: Teeth and Oropharynx as per pre-operative assessment  Difficulty Due To: Difficulty was anticipated, Difficult Airway- due to limited oral opening and Difficult Airway- due to reduced neck mobility Future Recommendations: Recommend- induction with short-acting agent, and alternative techniques readily available

## 2015-05-17 NOTE — Anesthesia Preprocedure Evaluation (Addendum)
Anesthesia Evaluation  Patient identified by MRN, date of birth, ID band Patient awake    Reviewed: Allergy & Precautions, NPO status , Patient's Chart, lab work & pertinent test results  Airway Mallampati: III   Neck ROM: Full    Dental  (+) Teeth Intact, Dental Advisory Given   Pulmonary neg pulmonary ROS, former smoker,    breath sounds clear to auscultation       Cardiovascular hypertension, Pt. on medications negative cardio ROS   Rhythm:Regular     Neuro/Psych negative neurological ROS  negative psych ROS   GI/Hepatic negative GI ROS, Neg liver ROS,   Endo/Other  negative endocrine ROSdiabetes, Type 2, Oral Hypoglycemic AgentsMorbid obesityBMI 38  Renal/GU negative Renal ROS  negative genitourinary   Musculoskeletal negative musculoskeletal ROS (+)   Abdominal   Peds negative pediatric ROS (+)  Hematology negative hematology ROS (+) 12/35   Anesthesia Other Findings   Reproductive/Obstetrics negative OB ROS                            Anesthesia Physical Anesthesia Plan  ASA: III  Anesthesia Plan: General   Post-op Pain Management:    Induction: Intravenous  Airway Management Planned: Oral ETT  Additional Equipment:   Intra-op Plan:   Post-operative Plan:   Informed Consent: I have reviewed the patients History and Physical, chart, labs and discussed the procedure including the risks, benefits and alternatives for the proposed anesthesia with the patient or authorized representative who has indicated his/her understanding and acceptance.     Plan Discussed with:   Anesthesia Plan Comments:         Anesthesia Quick Evaluation

## 2015-05-17 NOTE — Progress Notes (Signed)
Patient Demographics  Joseph Clay, is a 65 y.o. male, DOB - 12/04/1950, YQM:[id number]  Admit date - 05/15/2015   Admitting Physician Hillary Bow, DO  Outpatient Primary MD for the patient is [name], MD  LOS - 2   Chief Complaint  Patient presents with  . Abdominal Pain       Admission HPI/Brief narrative: 65 year old male with past medical history of hyperlipidemia, arthritis, chronic back pain, COPD, diabetes mellitus, hypertension, sleep apnea, presents with complaints of crampy abdominal pain, septic in ED, hypotensive with leukocytosis and fever 103.1, has elevated LFTs, CT abdomen pelvis significant for cholelithiasis, abdominal ultrasound significant for cholelithiasis as well, MRCP significant for 3 mm distal CBD stone, no intrahepatic or extrahepatic ductal dilatation, common duct measure up to 5 mm, ERCP with sphincterotomy/papillotomy x2, ERCP with balloon pull-through and attempt at removal of calculus/calculi , and ERCP with balloon dilation, surgery consulted for laparoscopic cholecystectomy. Subjective:   Joseph Clay today has, No headache, No chest pain, abdominal pain significantly subsided, fever overnight, denies any cough or shortness of breath. MAXIMUM TEMPERATURE 100.2 over last 24 hours.  Assessment & Plan    Principal Problem:   SIRS (systemic inflammatory response syndrome) (HCC) Active Problems:   HTN (hypertension)   DM type 2 (diabetes mellitus, type 2) (HCC)   Abdominal pain   Lactic acidosis   Hyperbilirubinemia   Transaminitis  Sepsis secondary to biliary tree disease cholangitis versus CBD stone - Resumed with fever, tachycardia, hypotension, leukocytosis and elevated lactic acid - continue with aggressive IV fluid resuscitation, his recent lactic acid within normal limits - Continue with IV Zosyn, blood culture growing gram-negative rods,  should be covered with Zosyn, will discontinue IV vancomycin - GI consult greatly appreciated, source most likely to cholangitis versus CBD stone, unlikely acute cholecystitis as no right upper quadrant tenderness,. -  MRCP significant for 3 mm distal CBD stone, no intrahepatic or extrahepatic ductal dilatation, common duct measure up to 5 mm, ERCP with sphincterotomy/papillotomy x2, ERCP with balloon pull-through and attempt at removal of calculus/calculi , and ERCP with balloon dilation, surgery consulted for laparoscopic cholecystectomy.  Elevated LFTs - Please see above discussion  Hyperkalemia - Received D50, IV calcium gluconate, Kayexalate - Resolved  Diabetes mellitus - Hold oral hypoglycemic agent, continue with insulin sliding scale  Hyperlipidemia - Hold statin in the setting of elevated LFTs  Hypertension - Please hold home meds in the setting of hypotension and sepsis - Agent with uncontrolled blood pressure after ERCP, will try to manage with when necessary hydralazine, as the blood pressure been on the lower side or controlled prior to ERCP  Code Status: Full  Family Communication: None at bedside  Disposition Plan: Remains in stepdown   Procedures  ERCP 2/2   Consults   Gastroenterology Gen. surgery  Medications  Scheduled Meds: . antiseptic oral rinse  7 mL Mouth Rinse q12n4p  . chlorhexidine  15 mL Mouth Rinse BID  . enoxaparin (LOVENOX) injection  65 mg Subcutaneous Q24H  . insulin aspart  0-9 Units Subcutaneous 6 times per day  . pantoprazole (PROTONIX) IV  40 mg Intravenous Q12H  . piperacillin-tazobactam (ZOSYN)  IV  3.375 g Intravenous Q8H  . sodium chloride flush  3 mL  Intravenous Q12H   Continuous Infusions: . sodium chloride Stopped (05/17/15 0943)   PRN Meds:.acetaminophen **OR** acetaminophen  DVT Prophylaxis  Lovenox   Lab Results  Component Value Date   PLT 157 05/17/2015    Antibiotics   Anti-infectives    Start      Dose/Rate Route Frequency Ordered Stop   05/16/15 2000  vancomycin (VANCOCIN) 1,500 mg in sodium chloride 0.9 % 500 mL IVPB  Status:  Discontinued     1,500 mg 250 mL/hr over 120 Minutes Intravenous Every 24 hours 05/16/15 0935 05/17/15 0704   05/16/15 1000  vancomycin (VANCOCIN) 1,250 mg in sodium chloride 0.9 % 250 mL IVPB  Status:  Discontinued     1,250 mg 166.7 mL/hr over 90 Minutes Intravenous Every 12 hours 05/15/15 2102 05/16/15 0935   05/16/15 0600  piperacillin-tazobactam (ZOSYN) IVPB 3.375 g     3.375 g 12.5 mL/hr over 240 Minutes Intravenous Every 8 hours 05/15/15 2100     05/15/15 2000  vancomycin (VANCOCIN) 2,000 mg in sodium chloride 0.9 % 500 mL IVPB     2,000 mg 250 mL/hr over 120 Minutes Intravenous  Once 05/15/15 1956 05/15/15 2300   05/15/15 2000  piperacillin-tazobactam (ZOSYN) IVPB 3.375 g     3.375 g 100 mL/hr over 30 Minutes Intravenous  Once 05/15/15 1956 05/16/15 0019          Objective:   Filed Vitals:   05/17/15 1325 05/17/15 1330 05/17/15 1331 05/17/15 1335  BP:  172/89    Pulse: 90 91 93 90  Temp:      TempSrc:      Resp: 36 31 31 36  Height:      Weight:      SpO2: 95% 93% 92% 92%    Wt Readings from Last 3 Encounters:  05/15/15 132.2 kg (291 lb 7.2 oz)  05/16/15 131.997 kg (291 lb)  03/25/13 133.811 kg (295 lb)     Intake/Output Summary (Last 24 hours) at 05/17/15 1426 Last data filed at 05/17/15 1000  Gross per 24 hour  Intake 2084.17 ml  Output   1100 ml  Net 984.17 ml     Physical Exam  Awake Alert, Oriented X 3, No new F.N deficits, Normal affect Joseph Clay,Joseph Clay Supple Neck,No JVD,  Symmetrical Chest wall movement, Good air movement bilaterally, CTAB RRR,No Gallops,Rubs or new Murmurs, No Parasternal Heave +ve B.Sounds, Abd Soft, No tenderness, No rebound - guarding or rigidity. No Cyanosis, Clubbing or edema, No new Rash or bruise     Data Review   Micro Results Recent Results (from the past 240 hour(s))  Blood  culture (routine x 2)     Status: None (Preliminary result)   Collection Time: 05/15/15  8:41 PM  Result Value Ref Range Status   Specimen Description BLOOD RIGHT HAND  Final   Special Requests BOTTLES DRAWN AEROBIC AND ANAEROBIC 5CC EACH  Final   Culture  Setup Time   Final    GRAM NEGATIVE RODS ANAEROBIC BOTTLE ONLY CRITICAL RESULT CALLED TO, READ BACK BY AND VERIFIED WITH: Claris Gladden RN 2011 05/16/15 A BROWNING    Culture   Final    GRAM NEGATIVE RODS CULTURE REINCUBATED FOR BETTER GROWTH Performed at [facility]    Report Status PENDING  Incomplete  MRSA PCR Screening     Status: None   Collection Time: 05/15/15 11:45 PM  Result Value Ref Range Status   MRSA by PCR NEGATIVE NEGATIVE Final    Comment:  The GeneXpert MRSA Assay (FDA approved for NASAL specimens only), is one component of a comprehensive MRSA colonization surveillance program. It is not intended to diagnose MRSA infection nor to guide or monitor treatment for MRSA infections.     Radiology Reports Ct Abdomen Pelvis W Contrast  05/15/2015  CLINICAL DATA:  Mid abdominal pain and cramping. Shortness of breath. EXAM: CT ABDOMEN AND PELVIS WITH CONTRAST TECHNIQUE: Multidetector CT imaging of the abdomen and pelvis was performed using the standard protocol following bolus administration of intravenous contrast. CONTRAST:  OMNIPAQUE IOHEXOL 300 MG/ML  SOLN COMPARISON:  05/31/2012 FINDINGS: Lower chest:  No acute findings. Hepatobiliary: 1.3 cm calcified gallstone without significant gallbladder distention or wall thickening. Normal appearance of liver and the portal venous system is patent. Pancreas: Normal appearance of the pancreas without inflammation or duct dilatation. Spleen: Normal appearance of spleen without enlargement. Adrenals/Urinary Tract: Many of the images are limited by motion artifact. There is a stable exophytic 1.1 cm low-density structure in the lateral right kidney which probably  represents a small exophytic cyst. Question another small cyst in the anterior right kidney. Normal appearance of the adrenal glands. No hydronephrosis or kidney stones. Normal appearance of the urinary bladder. Probable small cyst in the anterior left kidney that is too small to definitively characterize. Stomach/Bowel: Scattered colonic diverticular without acute inflammation. Mild dilatation of proximal jejunum. No evidence for obstruction. Vascular/Lymphatic: Again noted are prominent peri-aortic lymph nodes. Index lesion anterior to the aorta measures 1.3 x 1.1 cm on sequence 2, image 41 and previously measured 1.2 x 1.1 cm. The mild retroperitoneal lymphadenopathy has not significantly changed from the prior examination. Atherosclerotic calcifications in the abdominal aorta without aneurysm. Reproductive: Prostate is large for size measuring 5.5 x 6.5 cm on sequence 2, image 87. Prostate is nodular along the bladder base. Calcifications along the posterior aspect of the prostate. Other: No free fluid. Musculoskeletal: Multilevel degenerative facet disease in lumbar spine. No acute bone abnormality. IMPRESSION: No acute abnormality in the abdomen or pelvis. Cholelithiasis.  No evidence for gallbladder inflammation. Prostate hypertrophy.  Recommend correlation with PSA level. Electronically Signed   By: Richarda Overlie M.D.   On: 05/15/2015 19:30   Dg Chest Port 1 View  05/15/2015  CLINICAL DATA:  Patient greater mid abdominal pain and cramping. Some shortness of breath. Former smoker. History of hypertension and diabetes. EXAM: PORTABLE CHEST 1 VIEW COMPARISON:  03/25/2013 FINDINGS: There is mild opacity at the lung bases most likely atelectasis. No evidence of pulmonary edema. No apparent pleural effusion and no evidence of a pneumothorax. Cardiac silhouette is normal in size. No mediastinal or hilar masses or evidence of adenopathy. Bony thorax is grossly intact. IMPRESSION: 1. No acute cardiopulmonary disease.  2. Mild lung base atelectasis. Electronically Signed   By: Amie Portland M.D.   On: 05/15/2015 21:14   Dg Ercp Biliary & Pancreatic Ducts  05/17/2015  CLINICAL DATA:  Cholelithiasis with possible choledocholithiasis. EXAM: ERCP TECHNIQUE: Multiple spot images obtained with the fluoroscopic device and submitted for interpretation post-procedure. COMPARISON:  MRCP study earlier today. FINDINGS: Imaging from an ERCP procedure with a C-arm demonstrates cannulation of the common bile duct. Contrast injection shows a normal caliber duct and visualized intrahepatic ducts. No filling defects are seen on the submitted imaging. No evidence of contrast extravasation. IMPRESSION: Normal caliber bile ducts by ERCP with no filling defects identified. These images were submitted for radiologic interpretation only. Please see the procedural report for the amount of contrast and  the fluoroscopy time utilized. Electronically Signed   By: Irish Lack M.D.   On: 05/17/2015 13:05   Mr Abd W/wo Cm/mrcp  05/17/2015  CLINICAL DATA:  Mid lower abdominal pain, nausea, gallstone. EXAM: MRI ABDOMEN WITHOUT AND WITH CONTRAST (INCLUDING MRCP) TECHNIQUE: Multiplanar multisequence MR imaging of the abdomen was performed both before and after the administration of intravenous contrast. Heavily T2-weighted images of the biliary and pancreatic ducts were obtained, and three-dimensional MRCP images were rendered by post processing. CONTRAST:  20mL MULTIHANCE GADOBENATE DIMEGLUMINE 529 MG/ML IV SOLN COMPARISON:  Right upper quadrant ultrasound dated 05/15/2015. CT abdomen pelvis dated 05/15/2015. FINDINGS: Motion degraded images. Significant dielectric effect/standing wave artifact in the central abdomen related to patient body habitus and imaging at 3T. Lower chest:  Lung bases are clear. Hepatobiliary: Mild hepatic steatosis. No suspicious/enhancing hepatic lesions. Layering 1.4 cm gallstone (series 15/ image 21), without associated  gallbladder wall thickening or inflammatory changes. Common duct is obscured on many sequences but is well visualized on the axial T2 Fiesta sequence (series 13), were it is nondilated, measuring up to 5 mm. A tiny 3 mm distal CBD stone may be present (series 13/ image 31). Pancreas: Within normal limits. Spleen: Within normal limits. Adrenals/Urinary Tract: Bilateral adrenal glands are within normal limits. Kidneys are within normal limits.  No hydronephrosis. Stomach/Bowel: Stomach and visualized bowel are unremarkable. Vascular/Lymphatic: No evidence abdominal aortic aneurysm. No suspicious abdominal lymphadenopathy. Other: No abdominal ascites. Musculoskeletal: No focal osseous lesions. IMPRESSION: Motion degraded images with significant artifact in the central abdomen, as above. Cholelithiasis, without evidence of acute cholecystitis. No intrahepatic or extrahepatic ductal dilatation. Common duct measures up to 5 mm. Possible 3 mm distal CBD stone, equivocal. Mild hepatic steatosis. Electronically Signed   By: Charline Bills M.D.   On: 05/17/2015 08:08   US Abdomen Limited Ruq  05/15/2015  CLINICAL DATA:  Abdominal pain, cramping. Elevated liver function tests. EXAM: US ABDOMEN LIMITED - RIGHT UPPER QUADRANT COMPARISON:  CT abdomen and pelvis May 15, 2015 at 1913 hours FINDINGS: Limited assessment due to large body habitus. Gallbladder: Echogenic gallstone with acoustic shadowing. Gallbladder wall is mildly thickened at 3-4 mm. Trace pericholecystic fluid. No sonographic Murphy's sign elicited. Common bile duct: Diameter: 3.4 mm. Liver: No focal lesion identified. Diffusely echogenic. Hepatopetal portal vein. IMPRESSION: Habitus limited examination. Cholelithiasis and sonographic findings of mild cholecystitis though, no sonographic Murphy's sign elicited. Hepatic steatosis. Electronically Signed   By: Awilda Metro M.D.   On: 05/15/2015 21:12     CBC  Recent Labs Lab 05/15/15 1832  05/15/15 2043 05/16/15 0348 05/17/15 0333  WBC  --  15.2* 19.2* 9.5  HGB 16.0 13.0 12.1* 11.2*  HCT 47.0 40.1 37.5* 34.7*  PLT  --  191 194 157  MCV  --  84.1 85.2 85.7  MCH  --  27.3 27.5 27.7  MCHC  --  32.4 32.3 32.3  RDW  --  14.9 15.4 15.5  LYMPHSABS  --  0.6*  --   --   MONOABS  --  0.5  --   --   EOSABS  --  0.0  --   --   BASOSABS  --  0.0  --   --     Chemistries   Recent Labs Lab 05/15/15 1825 05/15/15 1832 05/16/15 0348 05/16/15 1417 05/17/15 0333  NA 139 136 138  --  141  K 5.4* 5.2* 6.3* 4.6 4.2  CL 100* 100* 104  --  107  CO2  20*  --  25  --  23  GLUCOSE 268* 268* 179*  --  160*  BUN 22* 31* 22*  --  11  CREATININE 1.13 1.10 1.64*  --  1.03  CALCIUM 10.2  --  8.4*  --  8.5*  AST 210*  --  196*  194* 127* 87*  ALT 149*  --  193*  196* 184* 152*  ALKPHOS 171*  --  132*  135* 126 118  BILITOT 4.3*  --  3.9*  4.2* 5.3* 5.9*   ------------------------------------------------------------------------------------------------------------------ estimated creatinine clearance is 102 mL/min (by C-G formula based on Cr of 1.03). ------------------------------------------------------------------------------------------------------------------ No results for input(s): HGBA1C in the last 72 hours. ------------------------------------------------------------------------------------------------------------------ No results for input(s): CHOL, HDL, LDLCALC, TRIG, CHOLHDL, LDLDIRECT in the last 72 hours. ------------------------------------------------------------------------------------------------------------------ No results for input(s): TSH, T4TOTAL, T3FREE, THYROIDAB in the last 72 hours.  Invalid input(s): FREET3 ------------------------------------------------------------------------------------------------------------------ No results for input(s): VITAMINB12, FOLATE, FERRITIN, TIBC, IRON, RETICCTPCT in the last 72 hours.  Coagulation profile  Recent  Labs Lab 05/17/15 0920  INR 1.17    No results for input(s): DDIMER in the last 72 hours.  Cardiac Enzymes No results for input(s): CKMB, TROPONINI, MYOGLOBIN in the last 168 hours.  Invalid input(s): CK ------------------------------------------------------------------------------------------------------------------ Invalid input(s): POCBNP     Time Spent in minutes   30 minutes   Romelle Muldoon M.D on 05/17/2015 at 2:26 PM  Between 7am to 7pm - Pager - [phone]  After 7pm go to [email or web address] - password [facility]  [facility]   Office  [phone]

## 2015-05-17 NOTE — Consult Note (Signed)
Reason for Consult:cbd stones Referring Physician: Dr. Ceasar Mons is an 65 y.o. male.  HPI:  The patient is a 65 year old white male who presented to the emergency department with some signs of sepsis and elevated liver functions. He did have some crampy abdominal discomfort at the time of presentation. He denies any nausea or vomiting. His family states that he has been having fevers at home for the last couple weeks. He underwent MRCP which suggested a distal common bile duct stone. He underwent ERCP today with sphincterotomy to clear the common bile duct. He feels groggy but denies any pain. His liver functions are elevated.  Past Medical History  Diagnosis Date  . Arthritis   . Hyperlipidemia     takes Pravastatin daily  . Bronchitis     couple of months ago  . Joint pain     right shoulder and right knee  . Chronic back pain     bulding disc  . Hx of colonic polyps   . Chronic kidney disease     bladder outlet obstruction-  RECENT HOSPITALIZATION WITH ACUTE RENAL FAILURE 2/13-      recent scans in [facility] from 2/13-  . Diabetes mellitus     takes Metformin bid  . Hypertension     EKG, Chest 2/13 [facility]- followed by Dr Electa Sniff, labs 05/31/11  [facility]  . Sleep apnea     CPAP-sleep study done 20month ago/ UNABLE TO LOCATE REPORT /  states SEVERE- doesnt know settings    Past Surgical History  Procedure Laterality Date  . Tonsillectomy      as a child  . Colonscopy    . Rt shoulder surgery      bone spur,scrape rotor cuff  . Shoulder arthroscopy      right   05/23/11/   [facility]  . Cystoscopy  06/16/2011    Procedure: CYSTOSCOPY;  Surgeon: LDutch Gray MD;  Location: [facility];  Service: Urology;  Laterality: N/A;       . Transurethral resection of prostate  06/16/2011    Procedure: TRANSURETHRAL RESECTION OF THE PROSTATE (TURP);  Surgeon: LDutch Gray MD;  Location: [facility];  Service: Urology;  Laterality: N/A;    Family History  Problem Relation Age of Onset  .  Anesthesia problems Neg Hx   . Hypotension Neg Hx   . Malignant hyperthermia Neg Hx   . Pseudochol deficiency Neg Hx     Social History:  reports that he has quit smoking. He does not have any smokeless tobacco history on file. He reports that he does not drink alcohol or use illicit drugs.  Allergies: No Active Allergies  Medications: I have reviewed the patient's current medications.  Results for orders placed or performed during the hospital encounter of 05/15/15 (from the past 48 hour(s))  Comprehensive metabolic panel     Status: Abnormal   Collection Time: 05/15/15  6:25 PM  Result Value Ref Range   Sodium 139 135 - 145 mmol/L   Potassium 5.4 (H) 3.5 - 5.1 mmol/L    Comment: SLIGHT HEMOLYSIS   Chloride 100 (L) 101 - 111 mmol/L   CO2 20 (L) 22 - 32 mmol/L   Glucose, Bld 268 (H) 65 - 99 mg/dL   BUN 22 (H) 6 - 20 mg/dL   Creatinine, Ser 1.13 0.61 - 1.24 mg/dL   Calcium 10.2 8.9 - 10.3 mg/dL   Total Protein 8.1 6.5 - 8.1 g/dL   Albumin 4.5 3.5 -  5.0 g/dL   AST 210 (H) 15 - 41 U/L   ALT 149 (H) 17 - 63 U/L   Alkaline Phosphatase 171 (H) 38 - 126 U/L   Total Bilirubin 4.3 (H) 0.3 - 1.2 mg/dL   GFR calc non Af Amer >60 >60 mL/min   GFR calc Af Amer >60 >60 mL/min    Comment: (NOTE) The eGFR has been calculated using the CKD EPI equation. This calculation has not been validated in all clinical situations. eGFR's persistently <60 mL/min signify possible Chronic Kidney Disease.    Anion gap 19 (H) 5 - 15  Lipase, blood     Status: None   Collection Time: 05/15/15  6:25 PM  Result Value Ref Range   Lipase 32 11 - 51 U/L  I-Stat Chem 8, ED     Status: Abnormal   Collection Time: 05/15/15  6:32 PM  Result Value Ref Range   Sodium 136 135 - 145 mmol/L   Potassium 5.2 (H) 3.5 - 5.1 mmol/L   Chloride 100 (L) 101 - 111 mmol/L   BUN 31 (H) 6 - 20 mg/dL   Creatinine, Ser 1.10 0.61 - 1.24 mg/dL   Glucose, Bld 268 (H) 65 - 99 mg/dL   Calcium, Ion 1.08 (L) 1.13 - 1.30 mmol/L    TCO2 24 0 - 100 mmol/L   Hemoglobin 16.0 13.0 - 17.0 g/dL   HCT 47.0 39.0 - 52.0 %  I-Stat CG4 Lactic Acid, ED     Status: Abnormal   Collection Time: 05/15/15  6:33 PM  Result Value Ref Range   Lactic Acid, Venous 5.32 (HH) 0.5 - 2.0 mmol/L   Comment NOTIFIED PHYSICIAN   CK     Status: None   Collection Time: 05/15/15  7:31 PM  Result Value Ref Range   Total CK 71 49 - 397 U/L  Procalcitonin     Status: None   Collection Time: 05/15/15  7:31 PM  Result Value Ref Range   Procalcitonin 3.07 ng/mL    Comment:        Interpretation: PCT > 2 ng/mL: Systemic infection (sepsis) is likely, unless other causes are known. (NOTE)         ICU PCT Algorithm               Non ICU PCT Algorithm    ----------------------------     ------------------------------         PCT < 0.25 ng/mL                 PCT < 0.1 ng/mL     Stopping of antibiotics            Stopping of antibiotics       strongly encouraged.               strongly encouraged.    ----------------------------     ------------------------------       PCT level decrease by               PCT < 0.25 ng/mL       >= 80% from peak PCT       OR PCT 0.25 - 0.5 ng/mL          Stopping of antibiotics  encouraged.     Stopping of antibiotics           encouraged.    ----------------------------     ------------------------------       PCT level decrease by              PCT >= 0.25 ng/mL       < 80% from peak PCT        AND PCT >= 0.5 ng/mL            Continuing antibiotics                                               encouraged.       Continuing antibiotics            encouraged.    ----------------------------     ------------------------------     PCT level increase compared          PCT > 0.5 ng/mL         with peak PCT AND          PCT >= 0.5 ng/mL             Escalation of antibiotics                                          strongly encouraged.      Escalation of antibiotics         strongly encouraged.   Urinalysis, Routine w reflex microscopic (not at [facility])     Status: Abnormal   Collection Time: 05/15/15  7:45 PM  Result Value Ref Range   Color, Urine YELLOW YELLOW   APPearance CLEAR CLEAR   Specific Gravity, Urine 1.045 (H) 1.005 - 1.030   pH 5.0 5.0 - 8.0   Glucose, UA 250 (A) NEGATIVE mg/dL   Hgb urine dipstick NEGATIVE NEGATIVE   Bilirubin Urine NEGATIVE NEGATIVE   Ketones, ur NEGATIVE NEGATIVE mg/dL   Protein, ur NEGATIVE NEGATIVE mg/dL   Nitrite NEGATIVE NEGATIVE   Leukocytes, UA NEGATIVE NEGATIVE    Comment: MICROSCOPIC NOT DONE ON URINES WITH NEGATIVE PROTEIN, BLOOD, LEUKOCYTES, NITRITE, OR GLUCOSE <1000 mg/dL.  Blood culture (routine x 2)     Status: None (Preliminary result)   Collection Time: 05/15/15  8:41 PM  Result Value Ref Range   Specimen Description BLOOD RIGHT HAND    Special Requests BOTTLES DRAWN AEROBIC AND ANAEROBIC 5CC EACH    Culture  Setup Time      GRAM NEGATIVE RODS ANAEROBIC BOTTLE ONLY CRITICAL RESULT CALLED TO, READ BACK BY AND VERIFIED WITH: Dollene Cleveland RN 2011 05/16/15 A BROWNING    Culture      GRAM NEGATIVE RODS CULTURE REINCUBATED FOR BETTER GROWTH Performed at [facility]    Report Status PENDING   CBC with Differential/Platelet     Status: Abnormal   Collection Time: 05/15/15  8:43 PM  Result Value Ref Range   WBC 15.2 (H) 4.0 - 10.5 K/uL   RBC 4.77 4.22 - 5.81 MIL/uL   Hemoglobin 13.0 13.0 - 17.0 g/dL   HCT 40.1 39.0 - 52.0 %   MCV 84.1 78.0 - 100.0 fL   MCH 27.3 26.0 - 34.0 pg   MCHC 32.4 30.0 - 36.0  g/dL   RDW 14.9 11.5 - 15.5 %   Platelets 191 150 - 400 K/uL   Neutrophils Relative % 93 %   Neutro Abs 14.1 (H) 1.7 - 7.7 K/uL   Lymphocytes Relative 4 %   Lymphs Abs 0.6 (L) 0.7 - 4.0 K/uL   Monocytes Relative 3 %   Monocytes Absolute 0.5 0.1 - 1.0 K/uL   Eosinophils Relative 0 %   Eosinophils Absolute 0.0 0.0 - 0.7 K/uL   Basophils Relative 0 %   Basophils Absolute 0.0 0.0 - 0.1 K/uL  Lactic acid,  plasma     Status: Abnormal   Collection Time: 05/15/15  8:43 PM  Result Value Ref Range   Lactic Acid, Venous 3.7 (HH) 0.5 - 2.0 mmol/L    Comment: CRITICAL RESULT CALLED TO, READ BACK BY AND VERIFIED WITH: ATI Marc Morgans [id number] @ 2119 BY J SCOTTON   Blood gas, arterial ([facility] ONLY)     Status: Abnormal   Collection Time: 05/15/15 10:19 PM  Result Value Ref Range   FIO2 0.32    O2 Content 3.0 L/min   Delivery systems NASAL CANNULA    pH, Arterial 7.353 7.350 - 7.450   pCO2 arterial 40.0 35.0 - 45.0 mmHg   pO2, Arterial 74.2 (L) 80.0 - 100.0 mmHg   Bicarbonate 20.9 20.0 - 24.0 mEq/L   TCO2 18.9 0 - 100 mmol/L   Acid-base deficit 2.9 (H) 0.0 - 2.0 mmol/L   O2 Saturation 90.8 %   Patient temperature 103.3    Collection site RIGHT RADIAL    Drawn by [id number]    Sample type ARTERIAL DRAW    Allens test (pass/fail) PASS PASS  CBG monitoring, ED     Status: Abnormal   Collection Time: 05/15/15 10:40 PM  Result Value Ref Range   Glucose-Capillary 182 (H) 65 - 99 mg/dL   Comment 1 Notify RN    Comment 2 Document in Chart   Glucose, capillary     Status: Abnormal   Collection Time: 05/15/15 11:33 PM  Result Value Ref Range   Glucose-Capillary 210 (H) 65 - 99 mg/dL   Comment 1 Notify RN   MRSA PCR Screening     Status: None   Collection Time: 05/15/15 11:45 PM  Result Value Ref Range   MRSA by PCR NEGATIVE NEGATIVE    Comment:        The GeneXpert MRSA Assay (FDA approved for NASAL specimens only), is one component of a comprehensive MRSA colonization surveillance program. It is not intended to diagnose MRSA infection nor to guide or monitor treatment for MRSA infections.   Lactic acid, plasma     Status: Abnormal   Collection Time: 05/16/15  3:40 AM  Result Value Ref Range   Lactic Acid, Venous 2.8 (HH) 0.5 - 2.0 mmol/L    Comment: CRITICAL RESULT CALLED TO, READ BACK BY AND VERIFIED WITH: K.GARCIA,RN AT 3903 ON 05/16/15 BY W.SHEA   CBC     Status: Abnormal    Collection Time: 05/16/15  3:48 AM  Result Value Ref Range   WBC 19.2 (H) 4.0 - 10.5 K/uL   RBC 4.40 4.22 - 5.81 MIL/uL   Hemoglobin 12.1 (L) 13.0 - 17.0 g/dL   HCT 37.5 (L) 39.0 - 52.0 %   MCV 85.2 78.0 - 100.0 fL   MCH 27.5 26.0 - 34.0 pg   MCHC 32.3 30.0 - 36.0 g/dL   RDW 15.4 11.5 - 15.5 %   Platelets 194 150 -  400 K/uL  Comprehensive metabolic panel     Status: Abnormal   Collection Time: 05/16/15  3:48 AM  Result Value Ref Range   Sodium 138 135 - 145 mmol/L   Potassium 6.3 (HH) 3.5 - 5.1 mmol/L    Comment: DELTA CHECK NOTED NO VISIBLE HEMOLYSIS CRITICAL RESULT CALLED TO, READ BACK BY AND VERIFIED WITH: K.GARCIA,RN AT 0444 ON 05/16/15 BY W.SHEA    Chloride 104 101 - 111 mmol/L   CO2 25 22 - 32 mmol/L   Glucose, Bld 179 (H) 65 - 99 mg/dL   BUN 22 (H) 6 - 20 mg/dL   Creatinine, Ser 1.64 (H) 0.61 - 1.24 mg/dL   Calcium 8.4 (L) 8.9 - 10.3 mg/dL   Total Protein 6.9 6.5 - 8.1 g/dL   Albumin 3.6 3.5 - 5.0 g/dL   AST 194 (H) 15 - 41 U/L   ALT 196 (H) 17 - 63 U/L   Alkaline Phosphatase 135 (H) 38 - 126 U/L   Total Bilirubin 4.2 (H) 0.3 - 1.2 mg/dL   GFR calc non Af Amer 42 (L) >60 mL/min   GFR calc Af Amer 49 (L) >60 mL/min    Comment: (NOTE) The eGFR has been calculated using the CKD EPI equation. This calculation has not been validated in all clinical situations. eGFR's persistently <60 mL/min signify possible Chronic Kidney Disease.    Anion gap 9 5 - 15  Hepatic function panel     Status: Abnormal   Collection Time: 05/16/15  3:48 AM  Result Value Ref Range   Total Protein 6.8 6.5 - 8.1 g/dL   Albumin 3.4 (L) 3.5 - 5.0 g/dL   AST 196 (H) 15 - 41 U/L   ALT 193 (H) 17 - 63 U/L   Alkaline Phosphatase 132 (H) 38 - 126 U/L   Total Bilirubin 3.9 (H) 0.3 - 1.2 mg/dL   Bilirubin, Direct 2.6 (H) 0.1 - 0.5 mg/dL   Indirect Bilirubin 1.3 (H) 0.3 - 0.9 mg/dL  Glucose, capillary     Status: Abnormal   Collection Time: 05/16/15  3:50 AM  Result Value Ref Range    Glucose-Capillary 167 (H) 65 - 99 mg/dL   Comment 1 Notify RN   Glucose, capillary     Status: Abnormal   Collection Time: 05/16/15  7:57 AM  Result Value Ref Range   Glucose-Capillary 112 (H) 65 - 99 mg/dL  Lactic acid, plasma     Status: None   Collection Time: 05/16/15 10:07 AM  Result Value Ref Range   Lactic Acid, Venous 1.8 0.5 - 2.0 mmol/L  Glucose, capillary     Status: Abnormal   Collection Time: 05/16/15 12:40 PM  Result Value Ref Range   Glucose-Capillary 129 (H) 65 - 99 mg/dL  Hepatic function panel     Status: Abnormal   Collection Time: 05/16/15  2:17 PM  Result Value Ref Range   Total Protein 6.7 6.5 - 8.1 g/dL   Albumin 3.3 (L) 3.5 - 5.0 g/dL   AST 127 (H) 15 - 41 U/L   ALT 184 (H) 17 - 63 U/L   Alkaline Phosphatase 126 38 - 126 U/L   Total Bilirubin 5.3 (H) 0.3 - 1.2 mg/dL   Bilirubin, Direct 3.5 (H) 0.1 - 0.5 mg/dL   Indirect Bilirubin 1.8 (H) 0.3 - 0.9 mg/dL  Potassium     Status: None   Collection Time: 05/16/15  2:17 PM  Result Value Ref Range   Potassium 4.6 3.5 - 5.1  mmol/L    Comment: REPEATED TO VERIFY DELTA CHECK NOTED NO VISIBLE HEMOLYSIS   Glucose, capillary     Status: Abnormal   Collection Time: 05/16/15  4:42 PM  Result Value Ref Range   Glucose-Capillary 124 (H) 65 - 99 mg/dL  Glucose, capillary     Status: Abnormal   Collection Time: 05/16/15  7:40 PM  Result Value Ref Range   Glucose-Capillary 110 (H) 65 - 99 mg/dL   Comment 1 Notify RN   Glucose, capillary     Status: Abnormal   Collection Time: 05/16/15 11:48 PM  Result Value Ref Range   Glucose-Capillary 129 (H) 65 - 99 mg/dL  Comprehensive metabolic panel     Status: Abnormal   Collection Time: 05/17/15  3:33 AM  Result Value Ref Range   Sodium 141 135 - 145 mmol/L   Potassium 4.2 3.5 - 5.1 mmol/L   Chloride 107 101 - 111 mmol/L   CO2 23 22 - 32 mmol/L   Glucose, Bld 160 (H) 65 - 99 mg/dL   BUN 11 6 - 20 mg/dL   Creatinine, Ser 1.03 0.61 - 1.24 mg/dL   Calcium 8.5 (L) 8.9 -  10.3 mg/dL   Total Protein 6.3 (L) 6.5 - 8.1 g/dL   Albumin 3.1 (L) 3.5 - 5.0 g/dL   AST 87 (H) 15 - 41 U/L   ALT 152 (H) 17 - 63 U/L   Alkaline Phosphatase 118 38 - 126 U/L   Total Bilirubin 5.9 (H) 0.3 - 1.2 mg/dL   GFR calc non Af Amer >60 >60 mL/min   GFR calc Af Amer >60 >60 mL/min    Comment: (NOTE) The eGFR has been calculated using the CKD EPI equation. This calculation has not been validated in all clinical situations. eGFR's persistently <60 mL/min signify possible Chronic Kidney Disease.    Anion gap 11 5 - 15  CBC     Status: Abnormal   Collection Time: 05/17/15  3:33 AM  Result Value Ref Range   WBC 9.5 4.0 - 10.5 K/uL    Comment: WHITE COUNT CONFIRMED ON SMEAR   RBC 4.05 (L) 4.22 - 5.81 MIL/uL   Hemoglobin 11.2 (L) 13.0 - 17.0 g/dL   HCT 34.7 (L) 39.0 - 52.0 %   MCV 85.7 78.0 - 100.0 fL   MCH 27.7 26.0 - 34.0 pg   MCHC 32.3 30.0 - 36.0 g/dL   RDW 15.5 11.5 - 15.5 %   Platelets 157 150 - 400 K/uL  Bilirubin, direct     Status: Abnormal   Collection Time: 05/17/15  3:33 AM  Result Value Ref Range   Bilirubin, Direct 3.1 (H) 0.1 - 0.5 mg/dL  Glucose, capillary     Status: Abnormal   Collection Time: 05/17/15  3:35 AM  Result Value Ref Range   Glucose-Capillary 135 (H) 65 - 99 mg/dL  Glucose, capillary     Status: Abnormal   Collection Time: 05/17/15  8:16 AM  Result Value Ref Range   Glucose-Capillary 138 (H) 65 - 99 mg/dL   Comment 1 Notify RN    Comment 2 Document in Chart   Protime-INR     Status: None   Collection Time: 05/17/15  9:20 AM  Result Value Ref Range   Prothrombin Time 15.1 11.6 - 15.2 seconds   INR 1.17 0.00 - 1.49  Glucose, capillary     Status: Abnormal   Collection Time: 05/17/15  2:15 PM  Result Value Ref Range   Glucose-Capillary  156 (H) 65 - 99 mg/dL    Ct Abdomen Pelvis W Contrast  05/15/2015  CLINICAL DATA:  Mid abdominal pain and cramping. Shortness of breath. EXAM: CT ABDOMEN AND PELVIS WITH CONTRAST TECHNIQUE: Multidetector  CT imaging of the abdomen and pelvis was performed using the standard protocol following bolus administration of intravenous contrast. CONTRAST:  122m OMNIPAQUE IOHEXOL 300 MG/ML  SOLN COMPARISON:  05/31/2012 FINDINGS: Lower chest:  No acute findings. Hepatobiliary: 1.3 cm calcified gallstone without significant gallbladder distention or wall thickening. Normal appearance of liver and the portal venous system is patent. Pancreas: Normal appearance of the pancreas without inflammation or duct dilatation. Spleen: Normal appearance of spleen without enlargement. Adrenals/Urinary Tract: Many of the images are limited by motion artifact. There is a stable exophytic 1.1 cm low-density structure in the lateral right kidney which probably represents a small exophytic cyst. Question another small cyst in the anterior right kidney. Normal appearance of the adrenal glands. No hydronephrosis or kidney stones. Normal appearance of the urinary bladder. Probable small cyst in the anterior left kidney that is too small to definitively characterize. Stomach/Bowel: Scattered colonic diverticular without acute inflammation. Mild dilatation of proximal jejunum. No evidence for obstruction. Vascular/Lymphatic: Again noted are prominent peri-aortic lymph nodes. Index lesion anterior to the aorta measures 1.3 x 1.1 cm on sequence 2, image 41 and previously measured 1.2 x 1.1 cm. The mild retroperitoneal lymphadenopathy has not significantly changed from the prior examination. Atherosclerotic calcifications in the abdominal aorta without aneurysm. Reproductive: Prostate is large for size measuring 5.5 x 6.5 cm on sequence 2, image 87. Prostate is nodular along the bladder base. Calcifications along the posterior aspect of the prostate. Other: No free fluid. Musculoskeletal: Multilevel degenerative facet disease in lumbar spine. No acute bone abnormality. IMPRESSION: No acute abnormality in the abdomen or pelvis. Cholelithiasis.  No  evidence for gallbladder inflammation. Prostate hypertrophy.  Recommend correlation with PSA level. Electronically Signed   By: AMarkus DaftM.D.   On: 05/15/2015 19:30   Dg Chest Port 1 View  05/15/2015  CLINICAL DATA:  Patient greater mid abdominal pain and cramping. Some shortness of breath. Former smoker. History of hypertension and diabetes. EXAM: PORTABLE CHEST 1 VIEW COMPARISON:  03/25/2013 FINDINGS: There is mild opacity at the lung bases most likely atelectasis. No evidence of pulmonary edema. No apparent pleural effusion and no evidence of a pneumothorax. Cardiac silhouette is normal in size. No mediastinal or hilar masses or evidence of adenopathy. Bony thorax is grossly intact. IMPRESSION: 1. No acute cardiopulmonary disease. 2. Mild lung base atelectasis. Electronically Signed   By: DLajean ManesM.D.   On: 05/15/2015 21:14   Dg Ercp Biliary & Pancreatic Ducts  05/17/2015  CLINICAL DATA:  Cholelithiasis with possible choledocholithiasis. EXAM: ERCP TECHNIQUE: Multiple spot images obtained with the fluoroscopic device and submitted for interpretation post-procedure. COMPARISON:  MRCP study earlier today. FINDINGS: Imaging from an ERCP procedure with a C-arm demonstrates cannulation of the common bile duct. Contrast injection shows a normal caliber duct and visualized intrahepatic ducts. No filling defects are seen on the submitted imaging. No evidence of contrast extravasation. IMPRESSION: Normal caliber bile ducts by ERCP with no filling defects identified. These images were submitted for radiologic interpretation only. Please see the procedural report for the amount of contrast and the fluoroscopy time utilized. Electronically Signed   By: GAletta EdouardM.D.   On: 05/17/2015 13:05   Mr Abd W/wo Cm/mrcp  05/17/2015  CLINICAL DATA:  Mid lower abdominal pain,  nausea, gallstone. EXAM: MRI ABDOMEN WITHOUT AND WITH CONTRAST (INCLUDING MRCP) TECHNIQUE: Multiplanar multisequence MR imaging of the  abdomen was performed both before and after the administration of intravenous contrast. Heavily T2-weighted images of the biliary and pancreatic ducts were obtained, and three-dimensional MRCP images were rendered by post processing. CONTRAST:  62m MULTIHANCE GADOBENATE DIMEGLUMINE 529 MG/ML IV SOLN COMPARISON:  Right upper quadrant ultrasound dated 05/15/2015. CT abdomen pelvis dated 05/15/2015. FINDINGS: Motion degraded images. Significant dielectric effect/standing wave artifact in the central abdomen related to patient body habitus and imaging at 3T. Lower chest:  Lung bases are clear. Hepatobiliary: Mild hepatic steatosis. No suspicious/enhancing hepatic lesions. Layering 1.4 cm gallstone (series 15/ image 21), without associated gallbladder wall thickening or inflammatory changes. Common duct is obscured on many sequences but is well visualized on the axial T2 Fiesta sequence (series 13), were it is nondilated, measuring up to 5 mm. A tiny 3 mm distal CBD stone may be present (series 13/ image 31). Pancreas: Within normal limits. Spleen: Within normal limits. Adrenals/Urinary Tract: Bilateral adrenal glands are within normal limits. Kidneys are within normal limits.  No hydronephrosis. Stomach/Bowel: Stomach and visualized bowel are unremarkable. Vascular/Lymphatic: No evidence abdominal aortic aneurysm. No suspicious abdominal lymphadenopathy. Other: No abdominal ascites. Musculoskeletal: No focal osseous lesions. IMPRESSION: Motion degraded images with significant artifact in the central abdomen, as above. Cholelithiasis, without evidence of acute cholecystitis. No intrahepatic or extrahepatic ductal dilatation. Common duct measures up to 5 mm. Possible 3 mm distal CBD stone, equivocal. Mild hepatic steatosis. Electronically Signed   By: SJulian HyM.D.   On: 05/17/2015 08:08   [place]dom[place] Limited Ruq  05/15/2015  CLINICAL DATA:  Abdominal pain, cramping. Elevated liver function tests. EXAM: [place] ABDOMEN LIMITED - RIGHT UPPER QUADRANT COMPARISON:  CT abdomen and pelvis May 15, 2015 at 1913 hours FINDINGS: Limited assessment due to large body habitus. Gallbladder: Echogenic gallstone with acoustic shadowing. Gallbladder wall is mildly thickened at 3-4 mm. Trace pericholecystic fluid. No sonographic Murphy's sign elicited. Common bile duct: Diameter: 3.4 mm. Liver: No focal lesion identified. Diffusely echogenic. Hepatopetal portal vein. IMPRESSION: Habitus limited examination. Cholelithiasis and sonographic findings of mild cholecystitis though, no sonographic Murphy's sign elicited. Hepatic steatosis. Electronically Signed   By: CElon AlasM.D.   On: 05/15/2015 21:12    Review of Systems  Constitutional: Positive for fever and chills.  HENT: Negative.   Eyes: Negative.   Respiratory: Negative.   Cardiovascular: Negative.   Gastrointestinal: Positive for abdominal pain. Negative for nausea and vomiting.  Genitourinary: Negative.   Musculoskeletal: Negative.   Skin: Negative.   Neurological: Negative.   Endo/Heme/Allergies: Negative.   Psychiatric/Behavioral: Negative.    Blood pressure 172/89, pulse 90, temperature 98.1 F (36.7 C), temperature source Oral, resp. rate 36, height 6' 2"  (1.88 m), weight 132.2 kg (291 lb 7.2 oz), SpO2 92 %. Physical Exam  Constitutional: He is oriented to person, place, and time. He appears well-developed and well-nourished.  HENT:  Head: Normocephalic and atraumatic.  Eyes: Conjunctivae and EOM are normal. Pupils are equal, round, and reactive to light.  Neck: Normal range of motion. Neck supple.  Cardiovascular: Normal rate, regular rhythm and normal heart sounds.   Respiratory: Effort normal and breath sounds normal.  GI: Soft. Bowel sounds are normal. There is no tenderness.  Musculoskeletal: Normal range of motion.  Neurological: He is alert and oriented to person, place, and time.  Skin: Skin is warm and dry.  Psychiatric: He has a  normal mood and affect. His behavior is normal.    Assessment/Plan:  The patient had symptoms of cholangitis with a common bile duct stone which has now been cleared by ERCP. Once his liver functions improve I think he would benefit from having his gallbladder removed so that this does not happen to him again. I have discussed with him in detail the risks and benefits of the operation to remove the gallbladder as well as some of the technical aspects and he understands and wishes to proceed. This includes the possible need for an open operation and the risk of common bile duct injury. I will plan for laparoscopic cholecystectomy once his liver functions improve  TOTH III,PAUL S 05/17/2015, 2:31 PM

## 2015-05-17 NOTE — Op Note (Signed)
[facility] [address] [place], [address]   2ERCP PROCEDURE REPORT  PATIENT: Joseph Clay, Joseph Clay  MR# :[id number] BIRTHDATE: Apr 27, 1950  GENDER: male ENDOSCOPIST: Vida Rigger, MD REFERRED BY: PROCEDURE DATE:  05/17/2015 PROCEDURE:   ERCP with sphincterotomy/papillotomy x2, ERCP with balloon pull-through and attempt at removal of calculus/calculi , and ERCP with balloon dilation ASA CLASS:    2 INDICATIONS: probable CBD stones and cholangitis MEDICATIONS:    gen. anesthesia TOPICAL ANESTHETIC: n one  DESCRIPTION OF PROCEDURE:   After the risks benefits and alternatives of the procedure were thoroughly explained, informed consent was obtained.  The ercp pentax [id number]  endoscope was introduced through the mouth and advanced to the second portion of the duodenum .multiple small shallow ulcers were seen in both the bulb and the second portion of the duodenum and a small ampulla was brought into view and we initially tried to cannulate with the regular sphincterotome and regular JAG Jagwirebut were unsuccessfuland we did partially fillthe pancreatic duct on one injection which was normal and we switched to the smaller sphincterotome and a smaller wireand were easily able to cannulate at this point and the wire was advanced into the intrahepatics and on initial cholangiogram there were questionable tiny stones versus tiny air bubblesand we proceeded with a small sphincterotomy in the customary fashion but with increased spasmand a small cyst ampulla we elected not to cut any more and even though there was not much biliary drainagewe exchanged the sphincterotome for the adjustable 12 mm balloon and proceeded with multiple balloon pull-through'sand the 12 mm balloon passed readily through the patent sphincterotomy site but no obvious stone or debris or signs of cholangitis was seen but there was still not much biliary drainage so we exchanged the small wire for the  regular wire through the balloon catheter and then exchanged the balloon catheter for the regular sphincterotome unfortunately the cutting wire broke at this pointand we elected to balloon dilate the ampulla with the 4 cm 8 mm balloon dilator but there was still sluggish drainage however we have better visualization of the ampulla and thought we could cut a little further so we remove the dilating catheter and wireand exchanged it for the small sphincterotome and a smaller wire and proceeded with increasing the sphincterotomy in the customary fashionuntil we have better biliary drainage and could get the fully bowed sphincterotome easily in and out of the ductand although the drainage was still a little sluggish it was improved and we elected to stop the procedure at this point and on multiple CBD injectionsno obvious residual stones were seen and the sphincterotome and wire and scope were removed and the patient tolerated the procedure wellthere was no obvious immediate complicationEstimated blood loss is zero unless otherwise noted in this procedure report.       COMPLICATIONS:  none  ENDOSCOPIC IMPRESSION:1.Multiple small superficial bulb and second portion of the duodenum ulcersprobably aspirin and nonsteroidal induced 2.Question mild tiny CBD stones versus air status post sphincterotomy -2 with multiple balloon pull-through size well3. One pancreatic duct injection with no wire advancements 4. Sluggishbut improved biliary drainage at the end of the procedure without obvious residual stone but no cystic duct or gallbladder filling throughout the procedure  RECOMMENDATIONS:Will given Indocin suppository would use pump inhibitorscare with aspirin and nonsteroidals observe for delayed complicationsand if none hopefully laparoscopic cholecystectomy tomorrow and will call surgery to see the patient     _______________________________ eSigned:  Vida Rigger, MD 05/17/2015  12:38 PM  CC:  PATIENT NAME:  Joseph Clay, Joseph Clay MR#: [id number]

## 2015-05-17 NOTE — Progress Notes (Signed)
Patient ID: Joseph Clay, male   DOB: June 22, 1950, 65 y.o.   MRN: [id number] [facility] Gastroenterology Progress Note  Joseph Clay 65 y.o. July 15, 1950   Subjective: Resting comfortably. Feels ok. Denies abdominal pain/N/V.  Objective: Vital signs in last 24 hours: Filed Vitals:   05/17/15 0600 05/17/15 0802  BP:  137/64  Pulse: 93 93  Temp:  98.6  Resp: 37 39    Physical Exam: Gen: lethargic, obese, no acute distress HEENT: +scleral icterus CV: RRR Chest: CTA B Abd: soft, nontender, nondistended, obese, +BS Ext: no edema  Lab Results:  Recent Labs  05/16/15 0348 05/16/15 1417 05/17/15 0333  NA 138  --  141  K 6.3* 4.6 4.2  CL 104  --  107  CO2 25  --  23  GLUCOSE 179*  --  160*  BUN 22*  --  11  CREATININE 1.64*  --  1.03  CALCIUM 8.4*  --  8.5*    Recent Labs  05/16/15 1417 05/17/15 0333  AST 127* 87*  ALT 184* 152*  ALKPHOS 126 118  BILITOT 5.3* 5.9*  PROT 6.7 6.3*  ALBUMIN 3.3* 3.1*    Recent Labs  05/15/15 2043 05/16/15 0348 05/17/15 0333  WBC 15.2* 19.2* 9.5  NEUTROABS 14.1*  --   --   HGB 13.0 12.1* 11.2*  HCT 40.1 37.5* 34.7*  MCV 84.1 85.2 85.7  PLT 191 194 157   No results for input(s): LABPROT, INR in the last 72 hours.    Assessment/Plan: 65 yo with elevated LFTs and gallstones and s/p MRCP overnight that showed no intrahepatic or extrahepatic biliary dilation and question of a 3mm distal CBD stone. AST/ALT are normalizing. TB has risen to 5.9. ALP normal and WBC normal. Afebrile and normotensive. Suspect a larger gallstone passed earlier and doubt the tiny stone is playing a role with his elevated LFTs. Clinically he is stable. Question ERCP vs lap chole with IOC. Will discuss with Dr. Ewing Schlein for consideration of preop ERCP. If ERCP not planned, then will need to call a surgery consult. Keep NPO until decision made on whether to do ERCP. Continue IV Abx. Supportive care.    Joseph Clay C. 05/17/2015, 8:38 AM  Pager  [phone]  If no answer or after 5 PM call [phone]

## 2015-05-18 ENCOUNTER — Encounter (HOSPITAL_COMMUNITY): Payer: Self-pay | Admitting: Gastroenterology

## 2015-05-18 LAB — CULTURE, BLOOD (ROUTINE X 2)

## 2015-05-18 LAB — CBC WITH DIFFERENTIAL/PLATELET
BASOS ABS: 0 10*3/uL (ref 0.0–0.1)
Basophils Relative: 0 %
Eosinophils Absolute: 0.1 10*3/uL (ref 0.0–0.7)
Eosinophils Relative: 1 %
HEMATOCRIT: 33.9 % — AB (ref 39.0–52.0)
HEMOGLOBIN: 10.9 g/dL — AB (ref 13.0–17.0)
LYMPHS PCT: 10 %
Lymphs Abs: 0.8 10*3/uL (ref 0.7–4.0)
MCH: 27.1 pg (ref 26.0–34.0)
MCHC: 32.2 g/dL (ref 30.0–36.0)
MCV: 84.3 fL (ref 78.0–100.0)
MONO ABS: 0.5 10*3/uL (ref 0.1–1.0)
Monocytes Relative: 6 %
NEUTROS ABS: 6.3 10*3/uL (ref 1.7–7.7)
NEUTROS PCT: 83 %
PLATELETS: 146 10*3/uL — AB (ref 150–400)
RBC: 4.02 MIL/uL — AB (ref 4.22–5.81)
RDW: 15.3 % (ref 11.5–15.5)
WBC: 7.6 10*3/uL (ref 4.0–10.5)

## 2015-05-18 LAB — COMPREHENSIVE METABOLIC PANEL
ALK PHOS: 133 U/L — AB (ref 38–126)
ALT: 121 U/L — AB (ref 17–63)
ANION GAP: 10 (ref 5–15)
AST: 52 U/L — ABNORMAL HIGH (ref 15–41)
Albumin: 3 g/dL — ABNORMAL LOW (ref 3.5–5.0)
BUN: 12 mg/dL (ref 6–20)
CALCIUM: 8.1 mg/dL — AB (ref 8.9–10.3)
CHLORIDE: 107 mmol/L (ref 101–111)
CO2: 22 mmol/L (ref 22–32)
CREATININE: 0.99 mg/dL (ref 0.61–1.24)
Glucose, Bld: 151 mg/dL — ABNORMAL HIGH (ref 65–99)
Potassium: 3.9 mmol/L (ref 3.5–5.1)
Sodium: 139 mmol/L (ref 135–145)
Total Bilirubin: 6.4 mg/dL — ABNORMAL HIGH (ref 0.3–1.2)
Total Protein: 6.6 g/dL (ref 6.5–8.1)

## 2015-05-18 LAB — GLUCOSE, CAPILLARY
GLUCOSE-CAPILLARY: 142 mg/dL — AB (ref 65–99)
GLUCOSE-CAPILLARY: 110 mg/dL — AB (ref 65–99)
GLUCOSE-CAPILLARY: 130 mg/dL — AB (ref 65–99)
GLUCOSE-CAPILLARY: 129 mg/dL — AB (ref 65–99)
Glucose-Capillary: 146 mg/dL — ABNORMAL HIGH (ref 65–99)
Glucose-Capillary: 128 mg/dL — ABNORMAL HIGH (ref 65–99)

## 2015-05-18 LAB — BILIRUBIN, DIRECT: Bilirubin, Direct: 4 mg/dL — ABNORMAL HIGH (ref 0.1–0.5)

## 2015-05-18 LAB — PSA: PSA: 3.24 ng/mL (ref 0.00–4.00)

## 2015-05-18 LAB — LIPASE, BLOOD: LIPASE: 3262 U/L — AB (ref 11–51)

## 2015-05-18 MED ORDER — HYDRALAZINE HCL 20 MG/ML IJ SOLN: 10.0000 mg | Freq: Once | INTRAMUSCULAR | Status: DC

## 2015-05-18 MED ORDER — AMLODIPINE BESYLATE 5 MG PO TABS
5.0000 mg | ORAL_TABLET | Freq: Every day | ORAL | Status: DC
  Administered 2015-05-18: 5 mg via ORAL
  Filled 2015-05-18: qty 1

## 2015-05-18 MED ORDER — HYDRALAZINE HCL 20 MG/ML IJ SOLN
10.0000 mg | INTRAMUSCULAR | Status: DC | PRN
  Administered 2015-05-19 (×5): 10 mg via INTRAVENOUS
  Filled 2015-05-18 (×5): qty 1

## 2015-05-18 MED ORDER — HYDRALAZINE HCL 20 MG/ML IJ SOLN
15.0000 mg | Freq: Once | INTRAMUSCULAR | Status: AC
  Administered 2015-05-18: 15 mg via INTRAVENOUS

## 2015-05-18 MED ORDER — AMLODIPINE BESYLATE 10 MG PO TABS
10.0000 mg | ORAL_TABLET | Freq: Every day | ORAL | Status: DC
  Administered 2015-05-19 – 2015-05-23 (×4): 10 mg via ORAL
  Filled 2015-05-18 (×4): qty 1

## 2015-05-18 MED ORDER — AMLODIPINE BESYLATE 5 MG PO TABS
5.0000 mg | ORAL_TABLET | Freq: Once | ORAL | Status: AC
  Administered 2015-05-18: 5 mg via ORAL
  Filled 2015-05-18: qty 1

## 2015-05-18 MED ORDER — MORPHINE SULFATE (PF) 4 MG/ML IV SOLN
INTRAVENOUS | Status: AC
  Administered 2015-05-18: 4 mg
  Filled 2015-05-18: qty 1

## 2015-05-18 MED ORDER — MORPHINE SULFATE (PF) 2 MG/ML IV SOLN: 2.0000 mg | INTRAVENOUS | Status: DC | PRN

## 2015-05-18 MED ORDER — HYDRALAZINE HCL 25 MG PO TABS
25.0000 mg | ORAL_TABLET | Freq: Four times a day (QID) | ORAL | Status: DC
  Administered 2015-05-18 – 2015-05-19 (×3): 25 mg via ORAL
  Filled 2015-05-18 (×3): qty 1

## 2015-05-18 NOTE — Progress Notes (Signed)
Patient ID: Joseph Clay, male   DOB: [birth year], 65 y.o.   MRN: [id number] [facility] Gastroenterology Progress Note  Joseph Clay 65 y.o. [birth year]   Subjective: Feels sore in abdomen and feels bloated. Denies abd pain. Denies N/V. No BMs overnight. Sitting in bedside chair. Daughter at bedside.  Objective: Vital signs in last 24 hours: Filed Vitals:   05/18/15 0500 05/18/15 0600  BP:  182/77  Pulse: 80 73  Temp:  98.2  Resp: 30 29    Physical Exam: Gen: lethargic, no acute distress, obese HEENT: +scleral icterus CV: RRR Chest: CTA B Abd: epigastric tenderness with minimal guarding, soft, nondistended, +BS  Lab Results:  Recent Labs  05/17/15 0333 05/18/15 0343  NA 141 139  K 4.2 3.9  CL 107 107  CO2 23 22  GLUCOSE 160* 151*  BUN 11 12  CREATININE 1.03 0.99  CALCIUM 8.5* 8.1*    Recent Labs  05/17/15 0333 05/18/15 0343  AST 87* 52*  ALT 152* 121*  ALKPHOS 118 133*  BILITOT 5.9* 6.4*  PROT 6.3* 6.6  ALBUMIN 3.1* 3.0*    Recent Labs  05/15/15 2043  05/17/15 0333 05/18/15 0343  WBC 15.2*  < > 9.5 7.6  NEUTROABS 14.1*  --   --  6.3  HGB 13.0  < > 11.2* 10.9*  HCT 40.1  < > 34.7* 33.9*  MCV 84.1  < > 85.7 84.3  PLT 191  < > 157 146*  < > = values in this interval not displayed.  Recent Labs  05/17/15 0920  LABPROT 15.1  INR 1.17   Lipase 3262   Assessment/Plan: S/P ERCP yesterday with sphincterotomy and sluggish drainage of CBD. Question tiny CBD stones vs air bubbles. Surgery consulted yesterday and post-ERCP lipase elevated at 3262. AST/ALT improving. TB, ALP rising. Clinically stable. Surgery postponed due to pancreatitis. Ice chips ok otherwise NPO. Supportive care. Dr. Ewing Schlein to see this weekend. D/W daughter.   Joseph Clay C. 05/18/2015, 8:22 AM  Pager [phone]  If no answer or after 5 PM call [phone]

## 2015-05-18 NOTE — Progress Notes (Signed)
Patient Demographics  Ramesh Moan, is a 65 y.o. male, DOB - [birth year], ZOX:[id number]  Admit date - 05/15/2015   Admitting Physician Hillary Bow, DO  Outpatient Primary MD for the patient is [name], MD  LOS - 3   Chief Complaint  Patient presents with  . Abdominal Pain       Admission HPI/Brief narrative: 65 year old male with past medical history of hyperlipidemia, arthritis, chronic back pain, COPD, diabetes mellitus, hypertension, sleep apnea, presents with complaints of crampy abdominal pain, septic in ED, hypotensive with leukocytosis and fever 103.1, has elevated LFTs, CT abdomen pelvis significant for cholelithiasis, abdominal ultrasound significant for cholelithiasis as well, MRCP significant for 3 mm distal CBD stone, no intrahepatic or extrahepatic ductal dilatation, common duct measure up to 5 mm, ERCP with sphincterotomy/papillotomy x2, ERCP with balloon pull-through and attempt at removal of calculus/calculi , and ERCP with balloon dilation, surgery consulted for laparoscopic cholecystectomy, patient with elevated lipase post ERCP. Subjective:   [name] today has, No headache, No chest pain,complaints of abdominal pain,  denies any cough or shortness of breath.   Assessment & Plan    Principal Problem:   SIRS (systemic inflammatory response syndrome) (HCC) Active Problems:   HTN (hypertension)   DM type 2 (diabetes mellitus, type 2) (HCC)   Abdominal pain   Lactic acidosis   Hyperbilirubinemia   Transaminitis  Sepsis secondary to cholangitis versus CBD stone - presented with fever, tachycardia, hypotension, leukocytosis and elevated lactic acid - continue with aggressive IV fluid resuscitation, his recent lactic acid within normal limits - Continue with IV Zosyn, blood culture growing enterobacter cloacae, sensitive to Zosynosyn, start IV vancomycin  2/2. - Gastroenterology  greatly appreciated, source most likely to cholangitis versus CBD stone -  MRCP significant for 3 mm distal CBD stone, no intrahepatic or extrahepatic ductal dilatation, common duct measure up to 5 mm, ERCP with sphincterotomy/papillotomy x2, ERCP with balloon pull-through and attempt at removal of calculus/calculi , and ERCP with balloon dilation. - Post ERCP pancreatitis , continue with IV fluids, bowel rest . - Due to consult appreciated, surgery on hold till improvement of pancreatitis   Pancreatitis  - Lipase today 3262, post ERCP pancreatitis, continue with IV fluids, bowel rest . - Started on when necessary pain medication   Elevated LFTs - Please see above discussion  Hyperkalemia - Received D50, IV calcium gluconate, Kayexalate - Resolved  Diabetes mellitus - Hold oral hypoglycemic agent, continue with insulin sliding scale  Hyperlipidemia - Hold statin in the setting of elevated LFTs  Hypertension - Labile, initially hypotensive, with episodic uncontrolled blood pressure resume on amlodipine, continue with when necessary hydralazine  Prostate hypertrophy with nodular appearance on imaging. - Will check PSA  Code Status: Full  Family Communication: None at bedside  Disposition Plan: Remains in stepdown   Procedures  ERCP 2/2   Consults   Gastroenterology Gen. surgery  Medications  Scheduled Meds: . antiseptic oral rinse  7 mL Mouth Rinse q12n4p  . chlorhexidine  15 mL Mouth Rinse BID  . enoxaparin (LOVENOX) injection  65 mg Subcutaneous Q24H  . insulin aspart  0-9 Units Subcutaneous 6 times per day  . pantoprazole (PROTONIX) IV  40 mg Intravenous Q12H  . piperacillin-tazobactam (  ZOSYN)  IV  3.375 g Intravenous Q8H  . sodium chloride flush  3 mL Intravenous Q12H   Continuous Infusions: . sodium chloride 125 mL/hr at 05/18/15 1007   PRN Meds:.acetaminophen **OR** acetaminophen, hydrALAZINE, morphine injection  DVT Prophylaxis   Lovenox   Lab Results  Component Value Date   PLT 146* 05/18/2015    Antibiotics   Anti-infectives    Start     Dose/Rate Route Frequency Ordered Stop   05/16/15 2000  vancomycin (VANCOCIN) 1,500 mg in sodium chloride 0.9 % 500 mL IVPB  Status:  Discontinued     1,500 mg 250 mL/hr over 120 Minutes Intravenous Every 24 hours 05/16/15 0935 05/17/15 0704   05/16/15 1000  vancomycin (VANCOCIN) 1,250 mg in sodium chloride 0.9 % 250 mL IVPB  Status:  Discontinued     1,250 mg 166.7 mL/hr over 90 Minutes Intravenous Every 12 hours 05/15/15 2102 05/16/15 0935   05/16/15 0600  piperacillin-tazobactam (ZOSYN) IVPB 3.375 g     3.375 g 12.5 mL/hr over 240 Minutes Intravenous Every 8 hours 05/15/15 2100     05/15/15 2000  vancomycin (VANCOCIN) 2,000 mg in sodium chloride 0.9 % 500 mL IVPB     2,000 mg 250 mL/hr over 120 Minutes Intravenous  Once 05/15/15 1956 05/15/15 2300   05/15/15 2000  piperacillin-tazobactam (ZOSYN) IVPB 3.375 g     3.375 g 100 mL/hr over 30 Minutes Intravenous  Once 05/15/15 1956 05/16/15 0019          Objective:   Filed Vitals:   05/18/15 0500 05/18/15 0600 05/18/15 0800 05/18/15 0932  BP:   163/75   Pulse: 80 73 71 66  Temp:   98.2 F (36.8 C)   TempSrc:   Oral   Resp: 30 29 35 25  Height:      Weight:      SpO2: 97% 98% 97% 98%    Wt Readings from Last 3 Encounters:  05/15/15 132.2 kg (291 lb 7.2 oz)  05/16/15 131.997 kg (291 lb)  03/25/13 133.811 kg (295 lb)     Intake/Output Summary (Last 24 hours) at 05/18/15 1124 Last data filed at 05/18/15 0900  Gross per 24 hour  Intake 2534.58 ml  Output    500 ml  Net 2034.58 ml     Physical Exam  Awake Alert, Oriented X 3, No new F.N deficits, Normal affect [place].AT,PERRAL Supple Neck,No JVD,  Symmetrical Chest wall movement, Good air movement bilaterally, CTAB RRR,No Gallops,Rubs or new Murmurs, No Parasternal Heave +ve B.Sounds, Abd Soft, mild diffuse tenderness  , No rebound - guarding or  rigidity. No Cyanosis, Clubbing or edema, No new Rash or bruise     Data Review   Micro Results Recent Results (from the past 240 hour(s))  Blood culture (routine x 2)     Status: None   Collection Time: 05/15/15  8:41 PM  Result Value Ref Range Status   Specimen Description BLOOD RIGHT HAND  Final   Special Requests BOTTLES DRAWN AEROBIC AND ANAEROBIC 5CC EACH  Final   Culture  Setup Time   Final    GRAM NEGATIVE RODS ANAEROBIC BOTTLE ONLY CRITICAL RESULT CALLED TO, READ BACK BY AND VERIFIED WITH: Claris Gladden RN 2011 05/16/15 A BROWNING    Culture   Final    ENTEROBACTER CLOACAE Performed at [facility]    Report Status 05/18/2015 FINAL  Final   Organism ID, Bacteria ENTEROBACTER CLOACAE  Final      Susceptibility  Enterobacter cloacae - MIC*    CEFAZOLIN >=64 RESISTANT Resistant     CEFEPIME <=1 SENSITIVE Sensitive     CEFTAZIDIME <=1 SENSITIVE Sensitive     CEFTRIAXONE <=1 SENSITIVE Sensitive     CIPROFLOXACIN <=0.25 SENSITIVE Sensitive     GENTAMICIN <=1 SENSITIVE Sensitive     IMIPENEM <=0.25 SENSITIVE Sensitive     TRIMETH/SULFA <=20 SENSITIVE Sensitive     PIP/TAZO <=4 SENSITIVE Sensitive     * ENTEROBACTER CLOACAE  MRSA PCR Screening     Status: None   Collection Time: 05/15/15 11:45 PM  Result Value Ref Range Status   MRSA by PCR NEGATIVE NEGATIVE Final    Comment:        The GeneXpert MRSA Assay (FDA approved for NASAL specimens only), is one component of a comprehensive MRSA colonization surveillance program. It is not intended to diagnose MRSA infection nor to guide or monitor treatment for MRSA infections.   Blood culture (routine x 2)     Status: None (Preliminary result)   Collection Time: 05/16/15  3:48 AM  Result Value Ref Range Status   Specimen Description BLOOD LEFT ARM  Final   Special Requests BOTTLES DRAWN AEROBIC AND ANAEROBIC 5CC  Final   Culture   Final    NO GROWTH 1 DAY Performed at [facility]    Report Status PENDING   Incomplete    Radiology Reports Ct Abdomen Pelvis W Contrast  05/15/2015  CLINICAL DATA:  Mid abdominal pain and cramping. Shortness of breath. EXAM: CT ABDOMEN AND PELVIS WITH CONTRAST TECHNIQUE: Multidetector CT imaging of the abdomen and pelvis was performed using the standard protocol following bolus administration of intravenous contrast. CONTRAST:  OMNIPAQUE IOHEXOL 300 MG/ML  SOLN COMPARISON:  05/31/2012 FINDINGS: Lower chest:  No acute findings. Hepatobiliary: 1.3 cm calcified gallstone without significant gallbladder distention or wall thickening. Normal appearance of liver and the portal venous system is patent. Pancreas: Normal appearance of the pancreas without inflammation or duct dilatation. Spleen: Normal appearance of spleen without enlargement. Adrenals/Urinary Tract: Many of the images are limited by motion artifact. There is a stable exophytic 1.1 cm low-density structure in the lateral right kidney which probably represents a small exophytic cyst. Question another small cyst in the anterior right kidney. Normal appearance of the adrenal glands. No hydronephrosis or kidney stones. Normal appearance of the urinary bladder. Probable small cyst in the anterior left kidney that is too small to definitively characterize. Stomach/Bowel: Scattered colonic diverticular without acute inflammation. Mild dilatation of proximal jejunum. No evidence for obstruction. Vascular/Lymphatic: Again noted are prominent peri-aortic lymph nodes. Index lesion anterior to the aorta measures 1.3 x 1.1 cm on sequence 2, image 41 and previously measured 1.2 x 1.1 cm. The mild retroperitoneal lymphadenopathy has not significantly changed from the prior examination. Atherosclerotic calcifications in the abdominal aorta without aneurysm. Reproductive: Prostate is large for size measuring 5.5 x 6.5 cm on sequence 2, image 87. Prostate is nodular along the bladder base. Calcifications along the posterior aspect of the  prostate. Other: No free fluid. Musculoskeletal: Multilevel degenerative facet disease in lumbar spine. No acute bone abnormality. IMPRESSION: No acute abnormality in the abdomen or pelvis. Cholelithiasis.  No evidence for gallbladder inflammation. Prostate hypertrophy.  Recommend correlation with PSA level. Electronically Signed   By: Richarda Overlie M.D.   On: 05/15/2015 19:30   Dg Chest Port 1 View  05/15/2015  CLINICAL DATA:  Patient greater mid abdominal pain and cramping. Some shortness of breath.  Former smoker. History of hypertension and diabetes. EXAM: PORTABLE CHEST 1 VIEW COMPARISON:  03/25/2013 FINDINGS: There is mild opacity at the lung bases most likely atelectasis. No evidence of pulmonary edema. No apparent pleural effusion and no evidence of a pneumothorax. Cardiac silhouette is normal in size. No mediastinal or hilar masses or evidence of adenopathy. Bony thorax is grossly intact. IMPRESSION: 1. No acute cardiopulmonary disease. 2. Mild lung base atelectasis. Electronically Signed   By: Amie Portland M.D.   On: 05/15/2015 21:14   Dg Ercp Biliary & Pancreatic Ducts  05/17/2015  CLINICAL DATA:  Cholelithiasis with possible choledocholithiasis. EXAM: ERCP TECHNIQUE: Multiple spot images obtained with the fluoroscopic device and submitted for interpretation post-procedure. COMPARISON:  MRCP study earlier today. FINDINGS: Imaging from an ERCP procedure with a C-arm demonstrates cannulation of the common bile duct. Contrast injection shows a normal caliber duct and visualized intrahepatic ducts. No filling defects are seen on the submitted imaging. No evidence of contrast extravasation. IMPRESSION: Normal caliber bile ducts by ERCP with no filling defects identified. These images were submitted for radiologic interpretation only. Please see the procedural report for the amount of contrast and the fluoroscopy time utilized. Electronically Signed   By: Irish Lack M.D.   On: 05/17/2015 13:05   Mr Abd  W/wo Cm/mrcp  05/17/2015  CLINICAL DATA:  Mid lower abdominal pain, nausea, gallstone. EXAM: MRI ABDOMEN WITHOUT AND WITH CONTRAST (INCLUDING MRCP) TECHNIQUE: Multiplanar multisequence MR imaging of the abdomen was performed both before and after the administration of intravenous contrast. Heavily T2-weighted images of the biliary and pancreatic ducts were obtained, and three-dimensional MRCP images were rendered by post processing. CONTRAST:  20mL MULTIHANCE GADOBENATE DIMEGLUMINE 529 MG/ML IV SOLN COMPARISON:  Right upper quadrant ultrasound dated 05/15/2015. CT abdomen pelvis dated 05/15/2015. FINDINGS: Motion degraded images. Significant dielectric effect/standing wave artifact in the central abdomen related to patient body habitus and imaging at 3T. Lower chest:  Lung bases are clear. Hepatobiliary: Mild hepatic steatosis. No suspicious/enhancing hepatic lesions. Layering 1.4 cm gallstone (series 15/ image 21), without associated gallbladder wall thickening or inflammatory changes. Common duct is obscured on many sequences but is well visualized on the axial T2 Fiesta sequence (series 13), were it is nondilated, measuring up to 5 mm. A tiny 3 mm distal CBD stone may be present (series 13/ image 31). Pancreas: Within normal limits. Spleen: Within normal limits. Adrenals/Urinary Tract: Bilateral adrenal glands are within normal limits. Kidneys are within normal limits.  No hydronephrosis. Stomach/Bowel: Stomach and visualized bowel are unremarkable. Vascular/Lymphatic: No evidence abdominal aortic aneurysm. No suspicious abdominal lymphadenopathy. Other: No abdominal ascites. Musculoskeletal: No focal osseous lesions. IMPRESSION: Motion degraded images with significant artifact in the central abdomen, as above. Cholelithiasis, without evidence of acute cholecystitis. No intrahepatic or extrahepatic ductal dilatation. Common duct measures up to 5 mm. Possible 3 mm distal CBD stone, equivocal. Mild hepatic  steatosis. Electronically Signed   By: Charline Bills M.D.   On: 05/17/2015 08:08   US Abdomen Limited Ruq  05/15/2015  CLINICAL DATA:  Abdominal pain, cramping. Elevated liver function tests. EXAM: US ABDOMEN LIMITED - RIGHT UPPER QUADRANT COMPARISON:  CT abdomen and pelvis May 15, 2015 at 1913 hours FINDINGS: Limited assessment due to large body habitus. Gallbladder: Echogenic gallstone with acoustic shadowing. Gallbladder wall is mildly thickened at 3-4 mm. Trace pericholecystic fluid. No sonographic Murphy's sign elicited. Common bile duct: Diameter: 3.4 mm. Liver: No focal lesion identified. Diffusely echogenic. Hepatopetal portal vein. IMPRESSION: Habitus limited examination. Cholelithiasis  and sonographic findings of mild cholecystitis though, no sonographic Murphy's sign elicited. Hepatic steatosis. Electronically Signed   By: Awilda Metro M.D.   On: 05/15/2015 21:12     CBC  Recent Labs Lab 05/15/15 1832 05/15/15 2043 05/16/15 0348 05/17/15 0333 05/18/15 0343  WBC  --  15.2* 19.2* 9.5 7.6  HGB 16.0 13.0 12.1* 11.2* 10.9*  HCT 47.0 40.1 37.5* 34.7* 33.9*  PLT  --  191 194 157 146*  MCV  --  84.1 85.2 85.7 84.3  MCH  --  27.3 27.5 27.7 27.1  MCHC  --  32.4 32.3 32.3 32.2  RDW  --  14.9 15.4 15.5 15.3  LYMPHSABS  --  0.6*  --   --  0.8  MONOABS  --  0.5  --   --  0.5  EOSABS  --  0.0  --   --  0.1  BASOSABS  --  0.0  --   --  0.0    Chemistries   Recent Labs Lab 05/15/15 1825 05/15/15 1832 05/16/15 0348 05/16/15 1417 05/17/15 0333 05/18/15 0343  NA 139 136 138  --  141 139  K 5.4* 5.2* 6.3* 4.6 4.2 3.9  CL 100* 100* 104  --  107 107  CO2 20*  --  25  --  23 22  GLUCOSE 268* 268* 179*  --  160* 151*  BUN 22* 31* 22*  --  11 12  CREATININE 1.13 1.10 1.64*  --  1.03 0.99  CALCIUM 10.2  --  8.4*  --  8.5* 8.1*  AST 210*  --  196*  194* 127* 87* 52*  ALT 149*  --  193*  196* 184* 152* 121*  ALKPHOS 171*  --  132*  135* 126 118 133*  BILITOT 4.3*  --   3.9*  4.2* 5.3* 5.9* 6.4*   ------------------------------------------------------------------------------------------------------------------ estimated creatinine clearance is 106.1 mL/min (by C-G formula based on Cr of 0.99). ------------------------------------------------------------------------------------------------------------------ No results for input(s): HGBA1C in the last 72 hours. ------------------------------------------------------------------------------------------------------------------ No results for input(s): CHOL, HDL, LDLCALC, TRIG, CHOLHDL, LDLDIRECT in the last 72 hours. ------------------------------------------------------------------------------------------------------------------ No results for input(s): TSH, T4TOTAL, T3FREE, THYROIDAB in the last 72 hours.  Invalid input(s): FREET3 ------------------------------------------------------------------------------------------------------------------ No results for input(s): VITAMINB12, FOLATE, FERRITIN, TIBC, IRON, RETICCTPCT in the last 72 hours.  Coagulation profile  Recent Labs Lab 05/17/15 0920  INR 1.17    No results for input(s): DDIMER in the last 72 hours.  Cardiac Enzymes No results for input(s): CKMB, TROPONINI, MYOGLOBIN in the last 168 hours.  Invalid input(s): CK ------------------------------------------------------------------------------------------------------------------ Invalid input(s): POCBNP     Time Spent in minutes   30 minutes   Rosie Torrez M.D on 05/18/2015 at 11:24 AM  Between 7am to 7pm - Pager - [phone]  After 7pm go to [email or web address] - password [facility]  [facility]   Office  [phone]

## 2015-05-18 NOTE — Progress Notes (Signed)
1 Day Post-Op  Subjective: Complains of more bloating and abd discomfort  Objective: Vital signs in last 24 hours: Temp:  [97.4 F (36.3 C)-99 F (37.2 C)] 98.2 F (36.8 C) (02/03 0420) Pulse Rate:  [73-108] 73 (02/03 0600) Resp:  [18-42] 29 (02/03 0600) BP: (106-208)/(62-100) 106/78 mmHg (02/03 0420) SpO2:  [91 %-99 %] 98 % (02/03 0600) Last BM Date: 05/16/15  Intake/Output from previous day: 02/02 0701 - 02/03 0700 In: 2246.7 [P.O.:360; I.V.:1686.7; IV Piggyback:200] Out: 700 [Urine:700] Intake/Output this shift:    Resp: clear to auscultation bilaterally Cardio: regular rate and rhythm GI: soft, mild diffuse tenderness. more distended  Lab Results:   Recent Labs  05/17/15 0333 05/18/15 0343  WBC 9.5 7.6  HGB 11.2* 10.9*  HCT 34.7* 33.9*  PLT 157 146*   BMET  Recent Labs  05/17/15 0333 05/18/15 0343  NA 141 139  K 4.2 3.9  CL 107 107  CO2 23 22  GLUCOSE 160* 151*  BUN 11 12  CREATININE 1.03 0.99  CALCIUM 8.5* 8.1*   PT/INR  Recent Labs  05/17/15 0920  LABPROT 15.1  INR 1.17   ABG  Recent Labs  05/15/15 2219  PHART 7.353  HCO3 20.9    Studies/Results: Dg Ercp Biliary & Pancreatic Ducts  05/17/2015  CLINICAL DATA:  Cholelithiasis with possible choledocholithiasis. EXAM: ERCP TECHNIQUE: Multiple spot images obtained with the fluoroscopic device and submitted for interpretation post-procedure. COMPARISON:  MRCP study earlier today. FINDINGS: Imaging from an ERCP procedure with a C-arm demonstrates cannulation of the common bile duct. Contrast injection shows a normal caliber duct and visualized intrahepatic ducts. No filling defects are seen on the submitted imaging. No evidence of contrast extravasation. IMPRESSION: Normal caliber bile ducts by ERCP with no filling defects identified. These images were submitted for radiologic interpretation only. Please see the procedural report for the amount of contrast and the fluoroscopy time utilized.  Electronically Signed   By: Irish Lack M.D.   On: 05/17/2015 13:05   Mr Abd W/wo Cm/mrcp  05/17/2015  CLINICAL DATA:  Mid lower abdominal pain, nausea, gallstone. EXAM: MRI ABDOMEN WITHOUT AND WITH CONTRAST (INCLUDING MRCP) TECHNIQUE: Multiplanar multisequence MR imaging of the abdomen was performed both before and after the administration of intravenous contrast. Heavily T2-weighted images of the biliary and pancreatic ducts were obtained, and three-dimensional MRCP images were rendered by post processing. CONTRAST:  20mL MULTIHANCE GADOBENATE DIMEGLUMINE 529 MG/ML IV SOLN COMPARISON:  Right upper quadrant ultrasound dated 05/15/2015. CT abdomen pelvis dated 05/15/2015. FINDINGS: Motion degraded images. Significant dielectric effect/standing wave artifact in the central abdomen related to patient body habitus and imaging at 3T. Lower chest:  Lung bases are clear. Hepatobiliary: Mild hepatic steatosis. No suspicious/enhancing hepatic lesions. Layering 1.4 cm gallstone (series 15/ image 21), without associated gallbladder wall thickening or inflammatory changes. Common duct is obscured on many sequences but is well visualized on the axial T2 Fiesta sequence (series 13), were it is nondilated, measuring up to 5 mm. A tiny 3 mm distal CBD stone may be present (series 13/ image 31). Pancreas: Within normal limits. Spleen: Within normal limits. Adrenals/Urinary Tract: Bilateral adrenal glands are within normal limits. Kidneys are within normal limits.  No hydronephrosis. Stomach/Bowel: Stomach and visualized bowel are unremarkable. Vascular/Lymphatic: No evidence abdominal aortic aneurysm. No suspicious abdominal lymphadenopathy. Other: No abdominal ascites. Musculoskeletal: No focal osseous lesions. IMPRESSION: Motion degraded images with significant artifact in the central abdomen, as above. Cholelithiasis, without evidence of acute cholecystitis. No intrahepatic or  extrahepatic ductal dilatation. Common duct  measures up to 5 mm. Possible 3 mm distal CBD stone, equivocal. Mild hepatic steatosis. Electronically Signed   By: Charline Bills M.D.   On: 05/17/2015 08:08    Anti-infectives: Anti-infectives    Start     Dose/Rate Route Frequency Ordered Stop   05/16/15 2000  vancomycin (VANCOCIN) 1,500 mg in sodium chloride 0.9 % 500 mL IVPB  Status:  Discontinued     1,500 mg 250 mL/hr over 120 Minutes Intravenous Every 24 hours 05/16/15 0935 05/17/15 0704   05/16/15 1000  vancomycin (VANCOCIN) 1,250 mg in sodium chloride 0.9 % 250 mL IVPB  Status:  Discontinued     1,250 mg 166.7 mL/hr over 90 Minutes Intravenous Every 12 hours 05/15/15 2102 05/16/15 0935   05/16/15 0600  piperacillin-tazobactam (ZOSYN) IVPB 3.375 g     3.375 g 12.5 mL/hr over 240 Minutes Intravenous Every 8 hours 05/15/15 2100     05/15/15 2000  vancomycin (VANCOCIN) 2,000 mg in sodium chloride 0.9 % 500 mL IVPB     2,000 mg 250 mL/hr over 120 Minutes Intravenous  Once 05/15/15 1956 05/15/15 2300   05/15/15 2000  piperacillin-tazobactam (ZOSYN) IVPB 3.375 g     3.375 g 100 mL/hr over 30 Minutes Intravenous  Once 05/15/15 1956 05/16/15 0019      Assessment/Plan: s/p Procedure(s): ENDOSCOPIC RETROGRADE CHOLANGIOPANCREATOGRAPHY (ERCP) (N/A) Post ERCP pancreatitis. Would start bowel rest until this improves Surgery on hold until pancreatitis resolves Monitor wbc and lft's  LOS: 3 days    TOTH III,Colsen Modi S 05/18/2015

## 2015-05-18 NOTE — Plan of Care (Signed)
Problem: Activity: Goal: Risk for activity intolerance will decrease Outcome: Progressing Pt HR up to 140s and RR up to mid 40s when just standing at bedside to urinate.

## 2015-05-19 ENCOUNTER — Inpatient Hospital Stay (HOSPITAL_COMMUNITY): Payer: Medicare Other

## 2015-05-19 LAB — HEPATIC FUNCTION PANEL
ALBUMIN: 3.2 g/dL — AB (ref 3.5–5.0)
ALK PHOS: 179 U/L — AB (ref 38–126)
ALT: 117 U/L — AB (ref 17–63)
AST: 64 U/L — AB (ref 15–41)
Bilirubin, Direct: 4.8 mg/dL — ABNORMAL HIGH (ref 0.1–0.5)
Indirect Bilirubin: 2.4 mg/dL — ABNORMAL HIGH (ref 0.3–0.9)
TOTAL PROTEIN: 7.1 g/dL (ref 6.5–8.1)
Total Bilirubin: 7.2 mg/dL — ABNORMAL HIGH (ref 0.3–1.2)

## 2015-05-19 LAB — BASIC METABOLIC PANEL
Anion gap: 11 (ref 5–15)
BUN: 9 mg/dL (ref 6–20)
CHLORIDE: 107 mmol/L (ref 101–111)
CO2: 24 mmol/L (ref 22–32)
Calcium: 8.8 mg/dL — ABNORMAL LOW (ref 8.9–10.3)
Creatinine, Ser: 0.8 mg/dL (ref 0.61–1.24)
GFR calc Af Amer: >60 mL/min (ref >60)
GFR calc non Af Amer: >60 mL/min (ref >60)
GLUCOSE: 161 mg/dL — AB (ref 65–99)
POTASSIUM: 3.7 mmol/L (ref 3.5–5.1)
Sodium: 142 mmol/L (ref 135–145)

## 2015-05-19 LAB — CBC WITH DIFFERENTIAL/PLATELET
Basophils Absolute: 0 10*3/uL (ref 0.0–0.1)
Basophils Relative: 0 %
EOS PCT: 0 %
Eosinophils Absolute: 0 10*3/uL (ref 0.0–0.7)
HCT: 37.3 % — ABNORMAL LOW (ref 39.0–52.0)
HEMOGLOBIN: 12.4 g/dL — AB (ref 13.0–17.0)
LYMPHS ABS: 0.8 10*3/uL (ref 0.7–4.0)
LYMPHS PCT: 8 %
MCH: 27.6 pg (ref 26.0–34.0)
MCHC: 33.2 g/dL (ref 30.0–36.0)
MCV: 82.9 fL (ref 78.0–100.0)
Monocytes Absolute: 0.8 10*3/uL (ref 0.1–1.0)
Monocytes Relative: 7 %
Neutro Abs: 9.4 10*3/uL — ABNORMAL HIGH (ref 1.7–7.7)
Neutrophils Relative %: 85 %
PLATELETS: 191 10*3/uL (ref 150–400)
RBC: 4.5 MIL/uL (ref 4.22–5.81)
RDW: 15.4 % (ref 11.5–15.5)
WBC: 11.1 10*3/uL — AB (ref 4.0–10.5)

## 2015-05-19 LAB — GLUCOSE, CAPILLARY
GLUCOSE-CAPILLARY: 133 mg/dL — AB (ref 65–99)
GLUCOSE-CAPILLARY: 135 mg/dL — AB (ref 65–99)
GLUCOSE-CAPILLARY: 135 mg/dL — AB (ref 65–99)
Glucose-Capillary: 135 mg/dL — ABNORMAL HIGH (ref 65–99)
Glucose-Capillary: 132 mg/dL — ABNORMAL HIGH (ref 65–99)
Glucose-Capillary: 154 mg/dL — ABNORMAL HIGH (ref 65–99)

## 2015-05-19 LAB — LIPASE, BLOOD: Lipase: 163 U/L — ABNORMAL HIGH (ref 11–51)

## 2015-05-19 MED ORDER — HYDRALAZINE HCL 25 MG PO TABS
25.0000 mg | ORAL_TABLET | Freq: Once | ORAL | Status: AC
  Administered 2015-05-19: 25 mg via ORAL
  Filled 2015-05-19: qty 1

## 2015-05-19 MED ORDER — FUROSEMIDE 10 MG/ML IJ SOLN
INTRAMUSCULAR | Status: AC
Start: 2015-05-19 — End: 2015-05-20
  Filled 2015-05-19: qty 4

## 2015-05-19 MED ORDER — HYDRALAZINE HCL 50 MG PO TABS
50.0000 mg | ORAL_TABLET | Freq: Three times a day (TID) | ORAL | Status: DC
  Administered 2015-05-19: 50 mg via ORAL
  Filled 2015-05-19 (×2): qty 1

## 2015-05-19 MED ORDER — METOPROLOL TARTRATE 1 MG/ML IV SOLN
5.0000 mg | Freq: Once | INTRAVENOUS | Status: AC
  Administered 2015-05-19: 5 mg via INTRAVENOUS
  Filled 2015-05-19: qty 5

## 2015-05-19 MED ORDER — METOPROLOL TARTRATE 1 MG/ML IV SOLN
2.5000 mg | Freq: Four times a day (QID) | INTRAVENOUS | Status: DC | PRN
  Administered 2015-05-20: 2.5 mg via INTRAVENOUS
  Filled 2015-05-19: qty 5

## 2015-05-19 MED ORDER — HYDRALAZINE HCL 50 MG PO TABS
50.0000 mg | ORAL_TABLET | Freq: Four times a day (QID) | ORAL | Status: DC
  Administered 2015-05-19 – 2015-05-23 (×13): 50 mg via ORAL
  Filled 2015-05-19 (×12): qty 1

## 2015-05-19 MED ORDER — FUROSEMIDE 10 MG/ML IJ SOLN
60.0000 mg | Freq: Once | INTRAMUSCULAR | Status: AC
  Administered 2015-05-19: 60 mg via INTRAVENOUS

## 2015-05-19 MED ORDER — CEFTRIAXONE SODIUM 2 G IJ SOLR
2.0000 g | INTRAMUSCULAR | Status: DC
  Administered 2015-05-19 – 2015-05-23 (×5): 2 g via INTRAVENOUS
  Filled 2015-05-19 (×5): qty 2

## 2015-05-19 MED ORDER — FUROSEMIDE 10 MG/ML IJ SOLN
INTRAMUSCULAR | Status: AC
  Filled 2015-05-19: qty 2

## 2015-05-19 NOTE — Progress Notes (Addendum)
Joseph Clay 10:18 AM  Subjective: Patient doing better than he was yesterday and better than when he was admitted and passing air from below and is thirsty and wants to try to eat and other than pedal edema has no new complaints and his case was discussed with the surgical team  Objective: Vital signs stable afebrile no acute distress abdomen is distended but soft occasional bowel sounds lipase decreased significantly bili still elevated other liver tests stable white count slight increased  Assessment: Gallstones questionable cholecystitis delayed biliary drainage from small ampulla status post sphincterotomy and balloon dilatation  Plan: We will allow clear liquids encourage ambulation if able and will recheck tomorrow and hopefully surgery soon with Intra-Op cholangiogram to reevaluate distal duct  [facility]  Pager [phone] After 5PM or if no answer call [phone]

## 2015-05-19 NOTE — Progress Notes (Signed)
Patient ID: Joseph Clay, male   DOB: Nov 27, 1950, 65 y.o.   MRN: [id number]  General Surgery [facility]  Subjective: Patient up in chair.  No complaints.  Belching.  Objective: Vital signs in last 24 hours: Temp:  [98.1 F (36.7 C)-98.6 F (37 C)] 98.4 F (36.9 C) (02/04 0400) Pulse Rate:  [66-119] 108 (02/04 0600) Resp:  [24-40] 33 (02/04 0600) BP: (141-214)/(64-113) 174/64 mmHg (02/04 0600) SpO2:  [91 %-99 %] 93 % (02/04 0600) Last BM Date: 05/17/15  Intake/Output from previous day: 02/03 0701 - 02/04 0700 In: 3542.1 [P.O.:480; I.V.:2859.6; IV Piggyback:162.5] Out: 2375 [Urine:2375] Intake/Output this shift:    Physical Exam: HEENT - sclerae clear, mucous membranes moist Neck - soft Chest - clear bilaterally Cor - RRR Abdomen - obese, protuberant; few BS present; non-tender; no mass Ext - no edema, non-tender Neuro - alert & oriented, no focal deficits  Lab Results:   Recent Labs  05/18/15 0343 05/19/15 0341  WBC 7.6 11.1*  HGB 10.9* 12.4*  HCT 33.9* 37.3*  PLT 146* 191   BMET  Recent Labs  05/18/15 0343 05/19/15 0341  NA 139 142  K 3.9 3.7  CL 107 107  CO2 22 24  GLUCOSE 151* 161*  BUN 12 9  CREATININE 0.99 0.80  CALCIUM 8.1* 8.8*   PT/INR  Recent Labs  05/17/15 0920  LABPROT 15.1  INR 1.17   Comprehensive Metabolic Panel:    Component Value Date/Time   NA 142 05/19/2015 0341   NA 139 05/18/2015 0343   K 3.7 05/19/2015 0341   K 3.9 05/18/2015 0343   CL 107 05/19/2015 0341   CL 107 05/18/2015 0343   CO2 24 05/19/2015 0341   CO2 22 05/18/2015 0343   BUN 9 05/19/2015 0341   BUN 12 05/18/2015 0343   CREATININE 0.80 05/19/2015 0341   CREATININE 0.99 05/18/2015 0343   GLUCOSE 161* 05/19/2015 0341   GLUCOSE 151* 05/18/2015 0343   CALCIUM 8.8* 05/19/2015 0341   CALCIUM 8.1* 05/18/2015 0343   AST 64* 05/19/2015 0341   AST 52* 05/18/2015 0343   ALT 117* 05/19/2015 0341   ALT 121* 05/18/2015 0343   ALKPHOS  179* 05/19/2015 0341   ALKPHOS 133* 05/18/2015 0343   BILITOT 7.2* 05/19/2015 0341   BILITOT 6.4* 05/18/2015 0343   PROT 7.1 05/19/2015 0341   PROT 6.6 05/18/2015 0343   ALBUMIN 3.2* 05/19/2015 0341   ALBUMIN 3.0* 05/18/2015 0343    Studies/Results: Dg Ercp Biliary & Pancreatic Ducts  05/17/2015  CLINICAL DATA:  Cholelithiasis with possible choledocholithiasis. EXAM: ERCP TECHNIQUE: Multiple spot images obtained with the fluoroscopic device and submitted for interpretation post-procedure. COMPARISON:  MRCP study earlier today. FINDINGS: Imaging from an ERCP procedure with a C-arm demonstrates cannulation of the common bile duct. Contrast injection shows a normal caliber duct and visualized intrahepatic ducts. No filling defects are seen on the submitted imaging. No evidence of contrast extravasation. IMPRESSION: Normal caliber bile ducts by ERCP with no filling defects identified. These images were submitted for radiologic interpretation only. Please see the procedural report for the amount of contrast and the fluoroscopy time utilized. Electronically Signed   By: Irish Lack M.D.   On: 05/17/2015 13:05    Assessment & Plans: Post-ERCP pancreatitis, elevated LFT's  Lipase improved to 161 this AM  TBili 7.2  Will monitor over weekend and plan lap chole early next week if continued improvement  Per medical and GI services  Velora Heckler,  MD, [facility] Office: [phone]   Tyronica [place] Judie Petit 05/19/2015

## 2015-05-19 NOTE — Progress Notes (Addendum)
Pharmacy Antibiotic Follow-up Note  Joseph Clay is a 65 y.o. year-old male admitted on 05/15/2015.  The patient is currently on day 5 of Zosyn for sepsis, cholangitis.  Assessment/Plan:  Contacted Dr. Randol Kern to consider narrowing Zosyn to Rocephin 2g IV q24h based on Enterobacter sensitivities  Temp (24hrs), Avg:98.4 F (36.9 C), Min:98.1 F (36.7 C), Max:98.6 F (37 C)   Recent Labs Lab 05/15/15 2043 05/16/15 0348 05/17/15 0333 05/18/15 0343 05/19/15 0341  WBC 15.2* 19.2* 9.5 7.6 11.1*    Recent Labs Lab 05/15/15 1832 05/16/15 0348 05/17/15 0333 05/18/15 0343 05/19/15 0341  CREATININE 1.10 1.64* 1.03 0.99 0.80   Estimated Creatinine Clearance: 131.3 mL/min (by C-G formula based on Cr of 0.8).    No Active Allergies  Antimicrobials this admission: 1/31 >> vancomycin >> 2/2 1/31 >> zosyn >>  Dose adjustments this admission: 2/1 SCr increased, vanc dose decreased per nomogram  Microbiology results: 1/31 BCx: 1 of 2 Enterobacter cloacae (R cefazolin only) 1/31 MRSA PCR: negative  Thank you for allowing pharmacy to be a part of this patient's care.  Loralee Pacas, PharmD, BCPS Pager: [phone]  05/19/2015 8:41 AM

## 2015-05-19 NOTE — Progress Notes (Signed)
Patient's telemetry was showing irregular rhythm with HR is 120s, EKG completed- read Sinus Tach with short PR with premature atrial complexes and also a critical QTC of . [facility] night coverage made aware. Order received to check electrolytes, hold midnight dose of PO hydralazine and stop PRN hydralazine, 2.5mg  metoprolol was ordered PRN for SBP>170. Pt denied chest pain, no sign of acute distress.

## 2015-05-19 NOTE — Progress Notes (Signed)
Patient Demographics  Joseph Clay, is a 65 y.o. male, DOB - 1951-02-03, ZOX:[id number]  Admit date - 05/15/2015   Admitting Physician Hillary Bow, DO  Outpatient Primary MD for the patient is [name], MD  LOS - 4   Chief Complaint  Patient presents with  . Abdominal Pain       Admission HPI/Brief narrative: 65 year old male with past medical history of hyperlipidemia, arthritis, chronic back pain, COPD, diabetes mellitus, hypertension, sleep apnea, presents with complaints of crampy abdominal pain, septic in ED, hypotensive with leukocytosis and fever 103.1, has elevated LFTs, CT abdomen pelvis significant for cholelithiasis, abdominal ultrasound significant for cholelithiasis as well, MRCP significant for 3 mm distal CBD stone, no intrahepatic or extrahepatic ductal dilatation, common duct measure up to 5 mm, ERCP with sphincterotomy/papillotomy x2, ERCP with balloon pull-through and attempt at removal of calculus/calculi , and ERCP with balloon dilation, surgery consulted for laparoscopic cholecystectomy, patient with elevated lipase post ERCP, . Subjective:   [name] today has, No headache, No chest pain,abd pain, with no change,  denies any cough or shortness of breath.   Assessment & Plan    Principal Problem:   SIRS (systemic inflammatory response syndrome) (HCC) Active Problems:   HTN (hypertension)   DM type 2 (diabetes mellitus, type 2) (HCC)   Abdominal pain   Lactic acidosis   Hyperbilirubinemia   Transaminitis  Sepsis secondary to cholangitis versus CBD stone , and bacteremia - presented with fever, tachycardia, hypotension, leukocytosis and elevated lactic acid - continue with IV fluid resuscitation, most recent  lactic acid within normal limits - Continue with IV Zosyn, blood culture growing enterobacter cloacae, sensitive to Zosyn and Rocephin been  treated with Zosyn since admission, will transition to Rocephin 05/19/2015,  stopped IV vancomycin 2/2. - Gastroenterology  greatly appreciated, source most likely to cholangitis versus CBD stone -  MRCP significant for 3 mm distal CBD stone, no intrahepatic or extrahepatic ductal dilatation, common duct measure up to 5 mm, ERCP with sphincterotomy/papillotomy x2, ERCP with balloon pull-through and attempt at removal of calculus/calculi , and ERCP with balloon dilation. - surgery consult appreciated, plan for laparoscopic cholecystectomy early next week if continues to improve.  Pancreatitis  - Lipase peaked at 3262, post ERCP pancreatitis,trending down, today is 163.  Elevated LFTs -Total bili 7.2 today .  Hyperkalemia - Received D50, IV calcium gluconate, Kayexalate - Resolved  Diabetes mellitus - Hold oral hypoglycemic agent, continue with insulin sliding scale  Hyperlipidemia - Hold statin in the setting of elevated LFTs  Hypertension - Labile, initially hypotensive, home meds has been on hold , recently uncontrolled, most likely due to IV fluids , increased home dose of amlodipine from 5-10 mg , started on by mouth hydralazine, as well on when necessary hydralazine , will decrease IV fluids.  Prostate hypertrophy with nodular appearance on imaging. - PSA WNL  Code Status: Full  Family Communication: None at bedside  Disposition Plan: Remains in stepdown   Procedures  ERCP 2/2   Consults   Gastroenterology Gen. surgery  Medications  Scheduled Meds: . amLODipine  10 mg Oral Daily  . antiseptic oral rinse  7 mL Mouth Rinse q12n4p  . chlorhexidine  15 mL Mouth Rinse BID  .  enoxaparin (LOVENOX) injection  65 mg Subcutaneous Q24H  . hydrALAZINE  50 mg Oral 3 times per day  . insulin aspart  0-9 Units Subcutaneous 6 times per day  . pantoprazole (PROTONIX) IV  40 mg Intravenous Q12H  . piperacillin-tazobactam (ZOSYN)  IV  3.375 g Intravenous Q8H  . sodium chloride  flush  3 mL Intravenous Q12H   Continuous Infusions: . sodium chloride 75 mL/hr at 05/19/15 0753   PRN Meds:.acetaminophen **OR** acetaminophen, hydrALAZINE, morphine injection  DVT Prophylaxis  Lovenox   Lab Results  Component Value Date   PLT 191 05/19/2015    Antibiotics   Anti-infectives    Start     Dose/Rate Route Frequency Ordered Stop   05/16/15 2000  vancomycin (VANCOCIN) 1,500 mg in sodium chloride 0.9 % 500 mL IVPB  Status:  Discontinued     1,500 mg 250 mL/hr over 120 Minutes Intravenous Every 24 hours 05/16/15 0935 05/17/15 0704   05/16/15 1000  vancomycin (VANCOCIN) 1,250 mg in sodium chloride 0.9 % 250 mL IVPB  Status:  Discontinued     1,250 mg 166.7 mL/hr over 90 Minutes Intravenous Every 12 hours 05/15/15 2102 05/16/15 0935   05/16/15 0600  piperacillin-tazobactam (ZOSYN) IVPB 3.375 g     3.375 g 12.5 mL/hr over 240 Minutes Intravenous Every 8 hours 05/15/15 2100     05/15/15 2000  vancomycin (VANCOCIN) 2,000 mg in sodium chloride 0.9 % 500 mL IVPB     2,000 mg 250 mL/hr over 120 Minutes Intravenous  Once 05/15/15 1956 05/15/15 2300   05/15/15 2000  piperacillin-tazobactam (ZOSYN) IVPB 3.375 g     3.375 g 100 mL/hr over 30 Minutes Intravenous  Once 05/15/15 1956 05/16/15 0019          Objective:   Filed Vitals:   05/19/15 0800 05/19/15 0900 05/19/15 0930 05/19/15 1000  BP: 166/79 174/71 174/71 169/84  Pulse: 108 103  103  Temp: 97.8 F (36.6 C)     TempSrc: Oral     Resp: 38 35  39  Height:      Weight:      SpO2: 95% 94%  94%    Wt Readings from Last 3 Encounters:  05/15/15 132.2 kg (291 lb 7.2 oz)  05/16/15 131.997 kg (291 lb)  03/25/13 133.811 kg (295 lb)     Intake/Output Summary (Last 24 hours) at 05/19/15 1058 Last data filed at 05/19/15 0900  Gross per 24 hour  Intake   3020 ml  Output   2575 ml  Net    445 ml     Physical Exam  Awake Alert, Oriented X 3, No new F.N deficits, Normal affect [place].AT,PERRAL Supple Neck,No  JVD,  Symmetrical Chest wall movement, Good air movement bilaterally, CTAB RRR,No Gallops,Rubs or new Murmurs, No Parasternal Heave +ve B.Sounds, Abd Soft, mild diffuse tenderness  , No rebound - guarding or rigidity. No Cyanosis, Clubbing, +2 edema, No new Rash or bruise     Data Review   Micro Results Recent Results (from the past 240 hour(s))  Blood culture (routine x 2)     Status: None   Collection Time: 05/15/15  8:41 PM  Result Value Ref Range Status   Specimen Description BLOOD RIGHT HAND  Final   Special Requests BOTTLES DRAWN AEROBIC AND ANAEROBIC 5CC EACH  Final   Culture  Setup Time   Final    GRAM NEGATIVE RODS ANAEROBIC BOTTLE ONLY CRITICAL RESULT CALLED TO, READ BACK BY AND VERIFIED WITH: K  AYERS RN 2011 05/16/15 A BROWNING    Culture   Final    ENTEROBACTER CLOACAE Performed at [facility]    Report Status 05/18/2015 FINAL  Final   Organism ID, Bacteria ENTEROBACTER CLOACAE  Final      Susceptibility   Enterobacter cloacae - MIC*    CEFAZOLIN >=64 RESISTANT Resistant     CEFEPIME <=1 SENSITIVE Sensitive     CEFTAZIDIME <=1 SENSITIVE Sensitive     CEFTRIAXONE <=1 SENSITIVE Sensitive     CIPROFLOXACIN <=0.25 SENSITIVE Sensitive     GENTAMICIN <=1 SENSITIVE Sensitive     IMIPENEM <=0.25 SENSITIVE Sensitive     TRIMETH/SULFA <=20 SENSITIVE Sensitive     PIP/TAZO <=4 SENSITIVE Sensitive     * ENTEROBACTER CLOACAE  MRSA PCR Screening     Status: None   Collection Time: 05/15/15 11:45 PM  Result Value Ref Range Status   MRSA by PCR NEGATIVE NEGATIVE Final    Comment:        The GeneXpert MRSA Assay (FDA approved for NASAL specimens only), is one component of a comprehensive MRSA colonization surveillance program. It is not intended to diagnose MRSA infection nor to guide or monitor treatment for MRSA infections.   Blood culture (routine x 2)     Status: None (Preliminary result)   Collection Time: 05/16/15  3:48 AM  Result Value Ref Range Status    Specimen Description BLOOD LEFT ARM  Final   Special Requests BOTTLES DRAWN AEROBIC AND ANAEROBIC 5CC  Final   Culture   Final    NO GROWTH 2 DAYS Performed at [facility]    Report Status PENDING  Incomplete    Radiology Reports Ct Abdomen Pelvis W Contrast  05/15/2015  CLINICAL DATA:  Mid abdominal pain and cramping. Shortness of breath. EXAM: CT ABDOMEN AND PELVIS WITH CONTRAST TECHNIQUE: Multidetector CT imaging of the abdomen and pelvis was performed using the standard protocol following bolus administration of intravenous contrast. CONTRAST:  OMNIPAQUE IOHEXOL 300 MG/ML  SOLN COMPARISON:  05/31/2012 FINDINGS: Lower chest:  No acute findings. Hepatobiliary: 1.3 cm calcified gallstone without significant gallbladder distention or wall thickening. Normal appearance of liver and the portal venous system is patent. Pancreas: Normal appearance of the pancreas without inflammation or duct dilatation. Spleen: Normal appearance of spleen without enlargement. Adrenals/Urinary Tract: Many of the images are limited by motion artifact. There is a stable exophytic 1.1 cm low-density structure in the lateral right kidney which probably represents a small exophytic cyst. Question another small cyst in the anterior right kidney. Normal appearance of the adrenal glands. No hydronephrosis or kidney stones. Normal appearance of the urinary bladder. Probable small cyst in the anterior left kidney that is too small to definitively characterize. Stomach/Bowel: Scattered colonic diverticular without acute inflammation. Mild dilatation of proximal jejunum. No evidence for obstruction. Vascular/Lymphatic: Again noted are prominent peri-aortic lymph nodes. Index lesion anterior to the aorta measures 1.3 x 1.1 cm on sequence 2, image 41 and previously measured 1.2 x 1.1 cm. The mild retroperitoneal lymphadenopathy has not significantly changed from the prior examination. Atherosclerotic calcifications in the  abdominal aorta without aneurysm. Reproductive: Prostate is large for size measuring 5.5 x 6.5 cm on sequence 2, image 87. Prostate is nodular along the bladder base. Calcifications along the posterior aspect of the prostate. Other: No free fluid. Musculoskeletal: Multilevel degenerative facet disease in lumbar spine. No acute bone abnormality. IMPRESSION: No acute abnormality in the abdomen or pelvis. Cholelithiasis.  No evidence  for gallbladder inflammation. Prostate hypertrophy.  Recommend correlation with PSA level. Electronically Signed   By: Richarda Overlie M.D.   On: 05/15/2015 19:30   Dg Chest Port 1 View  05/15/2015  CLINICAL DATA:  Patient greater mid abdominal pain and cramping. Some shortness of breath. Former smoker. History of hypertension and diabetes. EXAM: PORTABLE CHEST 1 VIEW COMPARISON:  03/25/2013 FINDINGS: There is mild opacity at the lung bases most likely atelectasis. No evidence of pulmonary edema. No apparent pleural effusion and no evidence of a pneumothorax. Cardiac silhouette is normal in size. No mediastinal or hilar masses or evidence of adenopathy. Bony thorax is grossly intact. IMPRESSION: 1. No acute cardiopulmonary disease. 2. Mild lung base atelectasis. Electronically Signed   By: Amie Portland M.D.   On: 05/15/2015 21:14   Dg Ercp Biliary & Pancreatic Ducts  05/17/2015  CLINICAL DATA:  Cholelithiasis with possible choledocholithiasis. EXAM: ERCP TECHNIQUE: Multiple spot images obtained with the fluoroscopic device and submitted for interpretation post-procedure. COMPARISON:  MRCP study earlier today. FINDINGS: Imaging from an ERCP procedure with a C-arm demonstrates cannulation of the common bile duct. Contrast injection shows a normal caliber duct and visualized intrahepatic ducts. No filling defects are seen on the submitted imaging. No evidence of contrast extravasation. IMPRESSION: Normal caliber bile ducts by ERCP with no filling defects identified. These images were  submitted for radiologic interpretation only. Please see the procedural report for the amount of contrast and the fluoroscopy time utilized. Electronically Signed   By: Irish Lack M.D.   On: 05/17/2015 13:05   Mr Abd W/wo Cm/mrcp  05/17/2015  CLINICAL DATA:  Mid lower abdominal pain, nausea, gallstone. EXAM: MRI ABDOMEN WITHOUT AND WITH CONTRAST (INCLUDING MRCP) TECHNIQUE: Multiplanar multisequence MR imaging of the abdomen was performed both before and after the administration of intravenous contrast. Heavily T2-weighted images of the biliary and pancreatic ducts were obtained, and three-dimensional MRCP images were rendered by post processing. CONTRAST:  20mL MULTIHANCE GADOBENATE DIMEGLUMINE 529 MG/ML IV SOLN COMPARISON:  Right upper quadrant ultrasound dated 05/15/2015. CT abdomen pelvis dated 05/15/2015. FINDINGS: Motion degraded images. Significant dielectric effect/standing wave artifact in the central abdomen related to patient body habitus and imaging at 3T. Lower chest:  Lung bases are clear. Hepatobiliary: Mild hepatic steatosis. No suspicious/enhancing hepatic lesions. Layering 1.4 cm gallstone (series 15/ image 21), without associated gallbladder wall thickening or inflammatory changes. Common duct is obscured on many sequences but is well visualized on the axial T2 Fiesta sequence (series 13), were it is nondilated, measuring up to 5 mm. A tiny 3 mm distal CBD stone may be present (series 13/ image 31). Pancreas: Within normal limits. Spleen: Within normal limits. Adrenals/Urinary Tract: Bilateral adrenal glands are within normal limits. Kidneys are within normal limits.  No hydronephrosis. Stomach/Bowel: Stomach and visualized bowel are unremarkable. Vascular/Lymphatic: No evidence abdominal aortic aneurysm. No suspicious abdominal lymphadenopathy. Other: No abdominal ascites. Musculoskeletal: No focal osseous lesions. IMPRESSION: Motion degraded images with significant artifact in the central  abdomen, as above. Cholelithiasis, without evidence of acute cholecystitis. No intrahepatic or extrahepatic ductal dilatation. Common duct measures up to 5 mm. Possible 3 mm distal CBD stone, equivocal. Mild hepatic steatosis. Electronically Signed   By: Charline Bills M.D.   On: 05/17/2015 08:08   US Abdomen Limited Ruq  05/15/2015  CLINICAL DATA:  Abdominal pain, cramping. Elevated liver function tests. EXAM: US ABDOMEN LIMITED - RIGHT UPPER QUADRANT COMPARISON:  CT abdomen and pelvis May 15, 2015 at 1913 hours FINDINGS: Limited  assessment due to large body habitus. Gallbladder: Echogenic gallstone with acoustic shadowing. Gallbladder wall is mildly thickened at 3-4 mm. Trace pericholecystic fluid. No sonographic Murphy's sign elicited. Common bile duct: Diameter: 3.4 mm. Liver: No focal lesion identified. Diffusely echogenic. Hepatopetal portal vein. IMPRESSION: Habitus limited examination. Cholelithiasis and sonographic findings of mild cholecystitis though, no sonographic Murphy's sign elicited. Hepatic steatosis. Electronically Signed   By: Awilda Metro M.D.   On: 05/15/2015 21:12     CBC  Recent Labs Lab 05/15/15 2043 05/16/15 0348 05/17/15 0333 05/18/15 0343 05/19/15 0341  WBC 15.2* 19.2* 9.5 7.6 11.1*  HGB 13.0 12.1* 11.2* 10.9* 12.4*  HCT 40.1 37.5* 34.7* 33.9* 37.3*  PLT 191 194 157 146* 191  MCV 84.1 85.2 85.7 84.3 82.9  MCH 27.3 27.5 27.7 27.1 27.6  MCHC 32.4 32.3 32.3 32.2 33.2  RDW 14.9 15.4 15.5 15.3 15.4  LYMPHSABS 0.6*  --   --  0.8 0.8  MONOABS 0.5  --   --  0.5 0.8  EOSABS 0.0  --   --  0.1 0.0  BASOSABS 0.0  --   --  0.0 0.0    Chemistries   Recent Labs Lab 05/15/15 1825 05/15/15 1832 05/16/15 0348 05/16/15 1417 05/17/15 0333 05/18/15 0343 05/19/15 0341  NA 139 136 138  --  141 139 142  K 5.4* 5.2* 6.3* 4.6 4.2 3.9 3.7  CL 100* 100* 104  --  107 107 107  CO2 20*  --  25  --  23 22 24   GLUCOSE 268* 268* 179*  --  160* 151* 161*  BUN 22* 31*  22*  --  11 12 9   CREATININE 1.13 1.10 1.64*  --  1.03 0.99 0.80  CALCIUM 10.2  --  8.4*  --  8.5* 8.1* 8.8*  AST 210*  --  196*  194* 127* 87* 52* 64*  ALT 149*  --  193*  196* 184* 152* 121* 117*  ALKPHOS 171*  --  132*  135* 126 118 133* 179*  BILITOT 4.3*  --  3.9*  4.2* 5.3* 5.9* 6.4* 7.2*   ------------------------------------------------------------------------------------------------------------------ estimated creatinine clearance is 131.3 mL/min (by C-G formula based on Cr of 0.8). ------------------------------------------------------------------------------------------------------------------ No results for input(s): HGBA1C in the last 72 hours. ------------------------------------------------------------------------------------------------------------------ No results for input(s): CHOL, HDL, LDLCALC, TRIG, CHOLHDL, LDLDIRECT in the last 72 hours. ------------------------------------------------------------------------------------------------------------------ No results for input(s): TSH, T4TOTAL, T3FREE, THYROIDAB in the last 72 hours.  Invalid input(s): FREET3 ------------------------------------------------------------------------------------------------------------------ No results for input(s): VITAMINB12, FOLATE, FERRITIN, TIBC, IRON, RETICCTPCT in the last 72 hours.  Coagulation profile  Recent Labs Lab 05/17/15 0920  INR 1.17    No results for input(s): DDIMER in the last 72 hours.  Cardiac Enzymes No results for input(s): CKMB, TROPONINI, MYOGLOBIN in the last 168 hours.  Invalid input(s): CK ------------------------------------------------------------------------------------------------------------------ Invalid input(s): POCBNP     Time Spent in minutes   30 minutes   Ethyn Schetter M.D on 05/19/2015 at 10:58 AM  Between 7am to 7pm - Pager - [phone]  After 7pm go to [email or web address] - password [email or web address]  [facility]   Office   [phone]

## 2015-05-20 LAB — CBC WITH DIFFERENTIAL/PLATELET
BASOS ABS: 0 10*3/uL (ref 0.0–0.1)
Basophils Relative: 0 %
Eosinophils Absolute: 0.1 10*3/uL (ref 0.0–0.7)
Eosinophils Relative: 0 %
HEMATOCRIT: 36.2 % — AB (ref 39.0–52.0)
HEMOGLOBIN: 11.6 g/dL — AB (ref 13.0–17.0)
LYMPHS PCT: 8 %
Lymphs Abs: 0.9 10*3/uL (ref 0.7–4.0)
MCH: 27.1 pg (ref 26.0–34.0)
MCHC: 32 g/dL (ref 30.0–36.0)
MCV: 84.6 fL (ref 78.0–100.0)
Monocytes Absolute: 1 10*3/uL (ref 0.1–1.0)
Monocytes Relative: 9 %
NEUTROS ABS: 10 10*3/uL — AB (ref 1.7–7.7)
NEUTROS PCT: 83 %
Platelets: 220 10*3/uL (ref 150–400)
RBC: 4.28 MIL/uL (ref 4.22–5.81)
RDW: 16 % — ABNORMAL HIGH (ref 11.5–15.5)
WBC: 12 10*3/uL — AB (ref 4.0–10.5)

## 2015-05-20 LAB — POTASSIUM: Potassium: 3.1 mmol/L — ABNORMAL LOW (ref 3.5–5.1)

## 2015-05-20 LAB — COMPREHENSIVE METABOLIC PANEL
ALBUMIN: 2.7 g/dL — AB (ref 3.5–5.0)
ALT: 90 U/L — AB (ref 17–63)
AST: 41 U/L (ref 15–41)
Alkaline Phosphatase: 153 U/L — ABNORMAL HIGH (ref 38–126)
Anion gap: 12 (ref 5–15)
BILIRUBIN TOTAL: 4.9 mg/dL — AB (ref 0.3–1.2)
BUN: 10 mg/dL (ref 6–20)
CALCIUM: 8.6 mg/dL — AB (ref 8.9–10.3)
CO2: 24 mmol/L (ref 22–32)
CREATININE: 0.99 mg/dL (ref 0.61–1.24)
Chloride: 101 mmol/L (ref 101–111)
GFR calc Af Amer: >60 mL/min (ref >60)
Glucose, Bld: 136 mg/dL — ABNORMAL HIGH (ref 65–99)
Potassium: 3.3 mmol/L — ABNORMAL LOW (ref 3.5–5.1)
Sodium: 137 mmol/L (ref 135–145)
TOTAL PROTEIN: 6.5 g/dL (ref 6.5–8.1)

## 2015-05-20 LAB — MAGNESIUM: Magnesium: 1.4 mg/dL — ABNORMAL LOW (ref 1.7–2.4)

## 2015-05-20 LAB — GLUCOSE, CAPILLARY
GLUCOSE-CAPILLARY: 128 mg/dL — AB (ref 65–99)
GLUCOSE-CAPILLARY: 138 mg/dL — AB (ref 65–99)
GLUCOSE-CAPILLARY: 152 mg/dL — AB (ref 65–99)
GLUCOSE-CAPILLARY: 162 mg/dL — AB (ref 65–99)
Glucose-Capillary: 134 mg/dL — ABNORMAL HIGH (ref 65–99)

## 2015-05-20 LAB — LIPASE, BLOOD: Lipase: 81 U/L — ABNORMAL HIGH (ref 11–51)

## 2015-05-20 MED ORDER — FUROSEMIDE 10 MG/ML IJ SOLN
INTRAMUSCULAR | Status: AC
  Filled 2015-05-20: qty 4

## 2015-05-20 MED ORDER — FUROSEMIDE 10 MG/ML IJ SOLN
40.0000 mg | Freq: Once | INTRAMUSCULAR | Status: AC
  Administered 2015-05-20: 40 mg via INTRAVENOUS
  Filled 2015-05-20: qty 4

## 2015-05-20 MED ORDER — MAGNESIUM CHLORIDE 64 MG PO TBEC
2.0000 | DELAYED_RELEASE_TABLET | Freq: Once | ORAL | Status: AC
  Administered 2015-05-20: 128 mg via ORAL
  Filled 2015-05-20: qty 2

## 2015-05-20 MED ORDER — POTASSIUM CHLORIDE CRYS ER 20 MEQ PO TBCR
30.0000 meq | EXTENDED_RELEASE_TABLET | ORAL | Status: AC
  Administered 2015-05-20 (×2): 30 meq via ORAL
  Filled 2015-05-20 (×2): qty 1

## 2015-05-20 MED ORDER — MAGNESIUM SULFATE 2 GM/50ML IV SOLN
2.0000 g | Freq: Once | INTRAVENOUS | Status: AC
  Administered 2015-05-20: 2 g via INTRAVENOUS
  Filled 2015-05-20: qty 50

## 2015-05-20 MED ORDER — POTASSIUM CHLORIDE CRYS ER 20 MEQ PO TBCR
40.0000 meq | EXTENDED_RELEASE_TABLET | Freq: Once | ORAL | Status: AC
  Administered 2015-05-20: 40 meq via ORAL
  Filled 2015-05-20: qty 2

## 2015-05-20 NOTE — Progress Notes (Signed)
Checked on Pt and inquired if he was ready for his CPAP QHS.  Pt stated that he was not ready yet and asked if it would be ok if he paged the RN when he was ready.  I informed him that the RN would call me whenever he contacted her so that would work fine.  Therefore, the Pt will have RN contact RT when he is ready for CPAP QHS.  RT will continue to monitor as needed.

## 2015-05-20 NOTE — Progress Notes (Signed)
Joseph Clay 8:50 AM  Subjective: Patient doing much better without GI complaints and tolerating clear liquids and case discussed with primary team and yesterday with surgical team  Objective: Vital signs stable afebrile abdomen is soft nontender good bowel sounds liver tests decreasing white count still slightly elevated  Assessment: Gallstones and probable cholecystitis with sluggish biliary drainage  Plan: Okay with me to advance diet if okay with surgical team and probable surgical options on Monday with hopefully repeat  Cholangiogram to reevaluate CBD  [facility]  Pager [phone] After 5PM or if no answer call [phone]

## 2015-05-20 NOTE — Progress Notes (Signed)
Patient Demographics  Joseph Clay, is a 65 y.o. male, DOB - 06/19/1950, WUJ:[id number]  Admit date - 05/15/2015   Admitting Physician Hillary Bow, DO  Outpatient Primary MD for the patient is [name], MD  LOS - 5   Chief Complaint  Patient presents with  . Abdominal Pain       Admission HPI/Brief narrative: 65 year old male with past medical history of hyperlipidemia, arthritis, chronic back pain, COPD, diabetes mellitus, hypertension, sleep apnea, presents with complaints of crampy abdominal pain, septic in ED, hypotensive with leukocytosis and fever 103.1, has elevated LFTs, CT abdomen pelvis significant for cholelithiasis, abdominal ultrasound significant for cholelithiasis as well, MRCP significant for 3 mm distal CBD stone, no intrahepatic or extrahepatic ductal dilatation, common duct measure up to 5 mm, ERCP with sphincterotomy/papillotomy x2, ERCP with balloon pull-through and attempt at removal of calculus/calculi , and ERCP with balloon dilation, surgery consulted for laparoscopic cholecystectomy, patient with elevated lipase post ERCP. Subjective:   Joseph Clay today has, No headache, No chest pain,abd pain, with no change,  denies any cough or shortness of breath.   Assessment & Plan    Principal Problem:   SIRS (systemic inflammatory response syndrome) (HCC) Active Problems:   HTN (hypertension)   DM type 2 (diabetes mellitus, type 2) (HCC)   Abdominal pain   Lactic acidosis   Hyperbilirubinemia   Transaminitis  Sepsis secondary to cholangitis versus CBD stone , and bacteremia - presented with fever, tachycardia, hypotension, leukocytosis and elevated lactic acid - Treated with IV fluid resuscitation, most recent  lactic acid within normal limits - Treated with IV Zosyn, blood culture growing enterobacter cloacae, sensitive to Zosyn and Rocephin been treated  with Zosyn since admission, transitioned to Rocephin 05/19/2015,  stopped IV vancomycin 2/2. - Gastroenterology  greatly appreciated, source most likely to cholangitis versus CBD stone -  MRCP significant for 3 mm distal CBD stone, no intrahepatic or extrahepatic ductal dilatation, common duct measure up to 5 mm, ERCP with sphincterotomy/papillotomy x2, ERCP with balloon pull-through and attempt at removal of calculus/calculi , and ERCP with balloon dilation. - surgery consult appreciated, plan for laparoscopic cholecystectomy early next week if continues to improve.  Pancreatitis  - Lipase peaked at 3262, post ERCP pancreatitis,trending down 163>81  Elevated LFTs - improving total bili is 4.9 today  Hyperkalemia - Received D50, IV calcium gluconate, Kayexalate - Resolved  Diabetes mellitus - Hold oral hypoglycemic agent, continue with insulin sliding scale  Hyperlipidemia - Hold statin in the setting of elevated LFTs  Hypertension - Labile, initially hypotensive, home meds has been on hold ,  , increased home dose of amlodipine from 5-10 mg , better controlled after starting on by mouth hydralazine, as well on when necessary hydralazine .  protonged QTc - And with significant hypokalemia and hypomagnesemia, resolved on repeat EKG this a.m. after repleting  Hypomagnesemia/hypokalemia - Repleted, recheck in a.m.  Prostate hypertrophy with nodular appearance on imaging. - PSA WNL  Code Status: Full  Family Communication: None at bedside  Disposition Plan: Remains in stepdown   Procedures  ERCP 2/2   Consults   Gastroenterology Gen. surgery  Medications  Scheduled Meds: . amLODipine  10 mg Oral Daily  . antiseptic oral rinse  7 mL Mouth Rinse q12n4p  . cefTRIAXone (ROCEPHIN)  IV  2 g Intravenous Q24H  . chlorhexidine  15 mL Mouth Rinse BID  . enoxaparin (LOVENOX) injection  65 mg Subcutaneous Q24H  . furosemide  40 mg Intravenous Once  . hydrALAZINE  50 mg Oral 4  times per day  . insulin aspart  0-9 Units Subcutaneous 6 times per day  . magnesium sulfate 1 - 4 g bolus IVPB  2 g Intravenous Once  . pantoprazole (PROTONIX) IV  40 mg Intravenous Q12H  . sodium chloride flush  3 mL Intravenous Q12H   Continuous Infusions:   PRN Meds:.acetaminophen **OR** acetaminophen, metoprolol, morphine injection  DVT Prophylaxis  Lovenox   Lab Results  Component Value Date   PLT 220 05/20/2015    Antibiotics   Anti-infectives    Start     Dose/Rate Route Frequency Ordered Stop   05/19/15 1200  cefTRIAXone (ROCEPHIN) 2 g in dextrose 5 % 50 mL IVPB     2 g 100 mL/hr over 30 Minutes Intravenous Every 24 hours 05/19/15 1130     05/16/15 2000  vancomycin (VANCOCIN) 1,500 mg in sodium chloride 0.9 % 500 mL IVPB  Status:  Discontinued     1,500 mg 250 mL/hr over 120 Minutes Intravenous Every 24 hours 05/16/15 0935 05/17/15 0704   05/16/15 1000  vancomycin (VANCOCIN) 1,250 mg in sodium chloride 0.9 % 250 mL IVPB  Status:  Discontinued     1,250 mg 166.7 mL/hr over 90 Minutes Intravenous Every 12 hours 05/15/15 2102 05/16/15 0935   05/16/15 0600  piperacillin-tazobactam (ZOSYN) IVPB 3.375 g  Status:  Discontinued     3.375 g 12.5 mL/hr over 240 Minutes Intravenous Every 8 hours 05/15/15 2100 05/19/15 1130   05/15/15 2000  vancomycin (VANCOCIN) 2,000 mg in sodium chloride 0.9 % 500 mL IVPB     2,000 mg 250 mL/hr over 120 Minutes Intravenous  Once 05/15/15 1956 05/15/15 2300   05/15/15 2000  piperacillin-tazobactam (ZOSYN) IVPB 3.375 g     3.375 g 100 mL/hr over 30 Minutes Intravenous  Once 05/15/15 1956 05/16/15 0019          Objective:   Filed Vitals:   05/20/15 0526 05/20/15 0600 05/20/15 0800 05/20/15 1122  BP: 149/69 135/65  128/63  Pulse:  97    Temp:   98.6 F (37 C)   TempSrc:   Oral   Resp:  30    Height:      Weight:      SpO2:  97%      Wt Readings from Last 3 Encounters:  05/15/15 132.2 kg (291 lb 7.2 oz)  05/16/15 131.997 kg  (291 lb)  03/25/13 133.811 kg (295 lb)     Intake/Output Summary (Last 24 hours) at 05/20/15 1129 Last data filed at 05/20/15 0500  Gross per 24 hour  Intake 1675.5 ml  Output   3525 ml  Net -1849.5 ml     Physical Exam  Awake Alert, Oriented X 3, No new F.N deficits, Normal affect [place].AT,PERRAL Supple Neck,No JVD,  Symmetrical Chest wall movement, Good air movement bilaterally, CTAB RRR,No Gallops,Rubs or new Murmurs, No Parasternal Heave +ve B.Sounds, Abd Soft, mild diffuse tenderness  , No rebound - guarding or rigidity. No Cyanosis, Clubbing, +2 edema, No new Rash or bruise     Data Review   Micro Results Recent Results (from the past 240 hour(s))  Blood culture (routine x 2)     Status: None  Collection Time: 05/15/15  8:41 PM  Result Value Ref Range Status   Specimen Description BLOOD RIGHT HAND  Final   Special Requests BOTTLES DRAWN AEROBIC AND ANAEROBIC 5CC EACH  Final   Culture  Setup Time   Final    GRAM NEGATIVE RODS ANAEROBIC BOTTLE ONLY CRITICAL RESULT CALLED TO, READ BACK BY AND VERIFIED WITH: Claris Gladden RN 2011 05/16/15 A BROWNING    Culture   Final    ENTEROBACTER CLOACAE Performed at [facility]    Report Status 05/18/2015 FINAL  Final   Organism ID, Bacteria ENTEROBACTER CLOACAE  Final      Susceptibility   Enterobacter cloacae - MIC*    CEFAZOLIN >=64 RESISTANT Resistant     CEFEPIME <=1 SENSITIVE Sensitive     CEFTAZIDIME <=1 SENSITIVE Sensitive     CEFTRIAXONE <=1 SENSITIVE Sensitive     CIPROFLOXACIN <=0.25 SENSITIVE Sensitive     GENTAMICIN <=1 SENSITIVE Sensitive     IMIPENEM <=0.25 SENSITIVE Sensitive     TRIMETH/SULFA <=20 SENSITIVE Sensitive     PIP/TAZO <=4 SENSITIVE Sensitive     * ENTEROBACTER CLOACAE  MRSA PCR Screening     Status: None   Collection Time: 05/15/15 11:45 PM  Result Value Ref Range Status   MRSA by PCR NEGATIVE NEGATIVE Final    Comment:        The GeneXpert MRSA Assay (FDA approved for NASAL  specimens only), is one component of a comprehensive MRSA colonization surveillance program. It is not intended to diagnose MRSA infection nor to guide or monitor treatment for MRSA infections.   Blood culture (routine x 2)     Status: None (Preliminary result)   Collection Time: 05/16/15  3:48 AM  Result Value Ref Range Status   Specimen Description BLOOD LEFT ARM  Final   Special Requests BOTTLES DRAWN AEROBIC AND ANAEROBIC 5CC  Final   Culture   Final    NO GROWTH 3 DAYS Performed at [facility]    Report Status PENDING  Incomplete    Radiology Reports Ct Abdomen Pelvis W Contrast  05/15/2015  CLINICAL DATA:  Mid abdominal pain and cramping. Shortness of breath. EXAM: CT ABDOMEN AND PELVIS WITH CONTRAST TECHNIQUE: Multidetector CT imaging of the abdomen and pelvis was performed using the standard protocol following bolus administration of intravenous contrast. CONTRAST:  OMNIPAQUE IOHEXOL 300 MG/ML  SOLN COMPARISON:  05/31/2012 FINDINGS: Lower chest:  No acute findings. Hepatobiliary: 1.3 cm calcified gallstone without significant gallbladder distention or wall thickening. Normal appearance of liver and the portal venous system is patent. Pancreas: Normal appearance of the pancreas without inflammation or duct dilatation. Spleen: Normal appearance of spleen without enlargement. Adrenals/Urinary Tract: Many of the images are limited by motion artifact. There is a stable exophytic 1.1 cm low-density structure in the lateral right kidney which probably represents a small exophytic cyst. Question another small cyst in the anterior right kidney. Normal appearance of the adrenal glands. No hydronephrosis or kidney stones. Normal appearance of the urinary bladder. Probable small cyst in the anterior left kidney that is too small to definitively characterize. Stomach/Bowel: Scattered colonic diverticular without acute inflammation. Mild dilatation of proximal jejunum. No evidence for  obstruction. Vascular/Lymphatic: Again noted are prominent peri-aortic lymph nodes. Index lesion anterior to the aorta measures 1.3 x 1.1 cm on sequence 2, image 41 and previously measured 1.2 x 1.1 cm. The mild retroperitoneal lymphadenopathy has not significantly changed from the prior examination. Atherosclerotic calcifications in the abdominal  aorta without aneurysm. Reproductive: Prostate is large for size measuring 5.5 x 6.5 cm on sequence 2, image 87. Prostate is nodular along the bladder base. Calcifications along the posterior aspect of the prostate. Other: No free fluid. Musculoskeletal: Multilevel degenerative facet disease in lumbar spine. No acute bone abnormality. IMPRESSION: No acute abnormality in the abdomen or pelvis. Cholelithiasis.  No evidence for gallbladder inflammation. Prostate hypertrophy.  Recommend correlation with PSA level. Electronically Signed   By: Richarda Overlie M.D.   On: 05/15/2015 19:30   Dg Chest Port 1 View  05/19/2015  CLINICAL DATA:  Respiratory difficulty. EXAM: PORTABLE CHEST 1 VIEW COMPARISON:  05/15/2015 FINDINGS: Lung volume are low. Cardiomediastinal contours are unchanged. Probable vascular congestion. Linear atelectasis in the lower lobes, unchanged from prior exam. No new airspace disease. No large pleural effusion or pneumothorax. Osseous structures are unchanged. IMPRESSION: Mild vascular congestion.  Unchanged bibasilar atelectasis. Electronically Signed   By: Rubye Oaks M.D.   On: 05/19/2015 19:24   Dg Chest Port 1 View  05/15/2015  CLINICAL DATA:  Patient greater mid abdominal pain and cramping. Some shortness of breath. Former smoker. History of hypertension and diabetes. EXAM: PORTABLE CHEST 1 VIEW COMPARISON:  03/25/2013 FINDINGS: There is mild opacity at the lung bases most likely atelectasis. No evidence of pulmonary edema. No apparent pleural effusion and no evidence of a pneumothorax. Cardiac silhouette is normal in size. No mediastinal or hilar  masses or evidence of adenopathy. Bony thorax is grossly intact. IMPRESSION: 1. No acute cardiopulmonary disease. 2. Mild lung base atelectasis. Electronically Signed   By: Amie Portland M.D.   On: 05/15/2015 21:14   Dg Ercp Biliary & Pancreatic Ducts  05/17/2015  CLINICAL DATA:  Cholelithiasis with possible choledocholithiasis. EXAM: ERCP TECHNIQUE: Multiple spot images obtained with the fluoroscopic device and submitted for interpretation post-procedure. COMPARISON:  MRCP study earlier today. FINDINGS: Imaging from an ERCP procedure with a C-arm demonstrates cannulation of the common bile duct. Contrast injection shows a normal caliber duct and visualized intrahepatic ducts. No filling defects are seen on the submitted imaging. No evidence of contrast extravasation. IMPRESSION: Normal caliber bile ducts by ERCP with no filling defects identified. These images were submitted for radiologic interpretation only. Please see the procedural report for the amount of contrast and the fluoroscopy time utilized. Electronically Signed   By: Irish Lack M.D.   On: 05/17/2015 13:05   Mr Abd W/wo Cm/mrcp  05/17/2015  CLINICAL DATA:  Mid lower abdominal pain, nausea, gallstone. EXAM: MRI ABDOMEN WITHOUT AND WITH CONTRAST (INCLUDING MRCP) TECHNIQUE: Multiplanar multisequence MR imaging of the abdomen was performed both before and after the administration of intravenous contrast. Heavily T2-weighted images of the biliary and pancreatic ducts were obtained, and three-dimensional MRCP images were rendered by post processing. CONTRAST:  20mL MULTIHANCE GADOBENATE DIMEGLUMINE 529 MG/ML IV SOLN COMPARISON:  Right upper quadrant ultrasound dated 05/15/2015. CT abdomen pelvis dated 05/15/2015. FINDINGS: Motion degraded images. Significant dielectric effect/standing wave artifact in the central abdomen related to patient body habitus and imaging at 3T. Lower chest:  Lung bases are clear. Hepatobiliary: Mild hepatic steatosis. No  suspicious/enhancing hepatic lesions. Layering 1.4 cm gallstone (series 15/ image 21), without associated gallbladder wall thickening or inflammatory changes. Common duct is obscured on many sequences but is well visualized on the axial T2 Fiesta sequence (series 13), were it is nondilated, measuring up to 5 mm. A tiny 3 mm distal CBD stone may be present (series 13/ image 31). Pancreas: Within normal limits.  Spleen: Within normal limits. Adrenals/Urinary Tract: Bilateral adrenal glands are within normal limits. Kidneys are within normal limits.  No hydronephrosis. Stomach/Bowel: Stomach and visualized bowel are unremarkable. Vascular/Lymphatic: No evidence abdominal aortic aneurysm. No suspicious abdominal lymphadenopathy. Other: No abdominal ascites. Musculoskeletal: No focal osseous lesions. IMPRESSION: Motion degraded images with significant artifact in the central abdomen, as above. Cholelithiasis, without evidence of acute cholecystitis. No intrahepatic or extrahepatic ductal dilatation. Common duct measures up to 5 mm. Possible 3 mm distal CBD stone, equivocal. Mild hepatic steatosis. Electronically Signed   By: Charline Bills M.D.   On: 05/17/2015 08:08   US Abdomen Limited Ruq  05/15/2015  CLINICAL DATA:  Abdominal pain, cramping. Elevated liver function tests. EXAM: US ABDOMEN LIMITED - RIGHT UPPER QUADRANT COMPARISON:  CT abdomen and pelvis May 15, 2015 at 1913 hours FINDINGS: Limited assessment due to large body habitus. Gallbladder: Echogenic gallstone with acoustic shadowing. Gallbladder wall is mildly thickened at 3-4 mm. Trace pericholecystic fluid. No sonographic Murphy's sign elicited. Common bile duct: Diameter: 3.4 mm. Liver: No focal lesion identified. Diffusely echogenic. Hepatopetal portal vein. IMPRESSION: Habitus limited examination. Cholelithiasis and sonographic findings of mild cholecystitis though, no sonographic Murphy's sign elicited. Hepatic steatosis. Electronically Signed    By: Awilda Metro M.D.   On: 05/15/2015 21:12     CBC  Recent Labs Lab 05/15/15 2043 05/16/15 0348 05/17/15 0333 05/18/15 0343 05/19/15 0341 05/20/15 0403  WBC 15.2* 19.2* 9.5 7.6 11.1* 12.0*  HGB 13.0 12.1* 11.2* 10.9* 12.4* 11.6*  HCT 40.1 37.5* 34.7* 33.9* 37.3* 36.2*  PLT 191 194 157 146* 191 220  MCV 84.1 85.2 85.7 84.3 82.9 84.6  MCH 27.3 27.5 27.7 27.1 27.6 27.1  MCHC 32.4 32.3 32.3 32.2 33.2 32.0  RDW 14.9 15.4 15.5 15.3 15.4 16.0*  LYMPHSABS 0.6*  --   --  0.8 0.8 0.9  MONOABS 0.5  --   --  0.5 0.8 1.0  EOSABS 0.0  --   --  0.1 0.0 0.1  BASOSABS 0.0  --   --  0.0 0.0 0.0    Chemistries   Recent Labs Lab 05/16/15 0348 05/16/15 1417 05/17/15 0333 05/18/15 0343 05/19/15 0341 05/19/15 2256 05/20/15 0403  NA 138  --  141 139 142  --  137  K 6.3* 4.6 4.2 3.9 3.7 3.1* 3.3*  CL 104  --  107 107 107  --  101  CO2 25  --  23 22 24   --  24  GLUCOSE 179*  --  160* 151* 161*  --  136*  BUN 22*  --  11 12 9   --  10  CREATININE 1.64*  --  1.03 0.99 0.80  --  0.99  CALCIUM 8.4*  --  8.5* 8.1* 8.8*  --  8.6*  MG  --   --   --   --   --  1.4*  --   AST 196*  194* 127* 87* 52* 64*  --  41  ALT 193*  196* 184* 152* 121* 117*  --  90*  ALKPHOS 132*  135* 126 118 133* 179*  --  153*  BILITOT 3.9*  4.2* 5.3* 5.9* 6.4* 7.2*  --  4.9*   ------------------------------------------------------------------------------------------------------------------ estimated creatinine clearance is 106.1 mL/min (by C-G formula based on Cr of 0.99). ------------------------------------------------------------------------------------------------------------------ No results for input(s): HGBA1C in the last 72 hours. ------------------------------------------------------------------------------------------------------------------ No results for input(s): CHOL, HDL, LDLCALC, TRIG, CHOLHDL, LDLDIRECT in the last 72  hours. ------------------------------------------------------------------------------------------------------------------ No results for  input(s): TSH, T4TOTAL, T3FREE, THYROIDAB in the last 72 hours.  Invalid input(s): FREET3 ------------------------------------------------------------------------------------------------------------------ No results for input(s): VITAMINB12, FOLATE, FERRITIN, TIBC, IRON, RETICCTPCT in the last 72 hours.  Coagulation profile  Recent Labs Lab 05/17/15 0920  INR 1.17    No results for input(s): DDIMER in the last 72 hours.  Cardiac Enzymes No results for input(s): CKMB, TROPONINI, MYOGLOBIN in the last 168 hours.  Invalid input(s): CK ------------------------------------------------------------------------------------------------------------------ Invalid input(s): POCBNP     Time Spent in minutes   30 minutes   Opal Dinning M.D on 05/20/2015 at 11:29 AM  Between 7am to 7pm - Pager - [phone]  After 7pm go to [email or web address] - password [facility]  [facility]   Office  [phone]

## 2015-05-21 ENCOUNTER — Inpatient Hospital Stay (HOSPITAL_COMMUNITY): Payer: Medicare Other | Admitting: Certified Registered"

## 2015-05-21 ENCOUNTER — Inpatient Hospital Stay (HOSPITAL_COMMUNITY): Payer: Medicare Other

## 2015-05-21 ENCOUNTER — Encounter (HOSPITAL_COMMUNITY): Payer: Self-pay | Admitting: Certified Registered"

## 2015-05-21 ENCOUNTER — Encounter (HOSPITAL_COMMUNITY)
Admission: EM | Disposition: A | Discharge status: Home Health | Payer: Self-pay | Source: Home / Self Care | Attending: Internal Medicine

## 2015-05-21 HISTORY — PX: CHOLECYSTECTOMY: SHX55

## 2015-05-21 LAB — CBC
HEMATOCRIT: 39.3 % (ref 39.0–52.0)
Hemoglobin: 12.3 g/dL — ABNORMAL LOW (ref 13.0–17.0)
MCH: 26.6 pg (ref 26.0–34.0)
MCHC: 31.3 g/dL (ref 30.0–36.0)
MCV: 85.1 fL (ref 78.0–100.0)
Platelets: 277 10*3/uL (ref 150–400)
RBC: 4.62 MIL/uL (ref 4.22–5.81)
RDW: 16.3 % — AB (ref 11.5–15.5)
WBC: 13.1 10*3/uL — ABNORMAL HIGH (ref 4.0–10.5)

## 2015-05-21 LAB — COMPREHENSIVE METABOLIC PANEL
ALBUMIN: 2.8 g/dL — AB (ref 3.5–5.0)
ALT: 64 U/L — ABNORMAL HIGH (ref 17–63)
ANION GAP: 13 (ref 5–15)
AST: 33 U/L (ref 15–41)
Alkaline Phosphatase: 144 U/L — ABNORMAL HIGH (ref 38–126)
BILIRUBIN TOTAL: 3 mg/dL — AB (ref 0.3–1.2)
BUN: 13 mg/dL (ref 6–20)
CHLORIDE: 100 mmol/L — AB (ref 101–111)
CO2: 26 mmol/L (ref 22–32)
Calcium: 8.8 mg/dL — ABNORMAL LOW (ref 8.9–10.3)
Creatinine, Ser: 0.99 mg/dL (ref 0.61–1.24)
GFR calc Af Amer: >60 mL/min (ref >60)
GFR calc non Af Amer: >60 mL/min (ref >60)
GLUCOSE: 145 mg/dL — AB (ref 65–99)
POTASSIUM: 3.5 mmol/L (ref 3.5–5.1)
SODIUM: 139 mmol/L (ref 135–145)
Total Protein: 6.8 g/dL (ref 6.5–8.1)

## 2015-05-21 LAB — GLUCOSE, CAPILLARY
GLUCOSE-CAPILLARY: 165 mg/dL — AB (ref 65–99)
GLUCOSE-CAPILLARY: 138 mg/dL — AB (ref 65–99)
GLUCOSE-CAPILLARY: 150 mg/dL — AB (ref 65–99)
GLUCOSE-CAPILLARY: 185 mg/dL — AB (ref 65–99)
GLUCOSE-CAPILLARY: 167 mg/dL — AB (ref 65–99)
Glucose-Capillary: 148 mg/dL — ABNORMAL HIGH (ref 65–99)
Glucose-Capillary: 135 mg/dL — ABNORMAL HIGH (ref 65–99)
Glucose-Capillary: 157 mg/dL — ABNORMAL HIGH (ref 65–99)

## 2015-05-21 LAB — CULTURE, BLOOD (ROUTINE X 2): Culture: NO GROWTH

## 2015-05-21 LAB — LIPASE, BLOOD: Lipase: 115 U/L — ABNORMAL HIGH (ref 11–51)

## 2015-05-21 SURGERY — LAPAROSCOPIC CHOLECYSTECTOMY WITH INTRAOPERATIVE CHOLANGIOGRAM
Anesthesia: General | Site: Abdomen

## 2015-05-21 MED ORDER — BUPIVACAINE HCL (PF) 0.25 % IJ SOLN
INTRAMUSCULAR | Status: DC | PRN
  Administered 2015-05-21: 30 mL

## 2015-05-21 MED ORDER — SUGAMMADEX SODIUM 500 MG/5ML IV SOLN
INTRAVENOUS | Status: AC
  Filled 2015-05-21: qty 5

## 2015-05-21 MED ORDER — FENTANYL CITRATE (PF) 250 MCG/5ML IJ SOLN
INTRAMUSCULAR | Status: AC
  Filled 2015-05-21: qty 5

## 2015-05-21 MED ORDER — ONDANSETRON HCL 4 MG/2ML IJ SOLN
INTRAMUSCULAR | Status: DC | PRN
  Administered 2015-05-21: 4 mg via INTRAVENOUS

## 2015-05-21 MED ORDER — MIDAZOLAM HCL 2 MG/2ML IJ SOLN
INTRAMUSCULAR | Status: AC
  Filled 2015-05-21: qty 2

## 2015-05-21 MED ORDER — SUGAMMADEX SODIUM 500 MG/5ML IV SOLN
INTRAVENOUS | Status: DC | PRN
  Administered 2015-05-21: 500 mg via INTRAVENOUS

## 2015-05-21 MED ORDER — MIDAZOLAM HCL 5 MG/5ML IJ SOLN
INTRAMUSCULAR | Status: DC | PRN
  Administered 2015-05-21: 2 mg via INTRAVENOUS

## 2015-05-21 MED ORDER — LIDOCAINE HCL (PF) 2 % IJ SOLN
INTRAMUSCULAR | Status: DC | PRN
  Administered 2015-05-21: 30 mg via INTRADERMAL

## 2015-05-21 MED ORDER — HYDROCODONE-ACETAMINOPHEN 5-325 MG PO TABS: 1.0000 | ORAL_TABLET | ORAL | Status: DC | PRN

## 2015-05-21 MED ORDER — ROCURONIUM BROMIDE 100 MG/10ML IV SOLN
INTRAVENOUS | Status: AC
  Filled 2015-05-21: qty 1

## 2015-05-21 MED ORDER — LACTATED RINGERS IV SOLN
INTRAVENOUS | Status: DC | PRN
  Administered 2015-05-21: 09:00:00 via INTRAVENOUS

## 2015-05-21 MED ORDER — PHENYLEPHRINE HCL 10 MG/ML IJ SOLN
INTRAMUSCULAR | Status: DC | PRN
  Administered 2015-05-21 (×2): 80 ug via INTRAVENOUS

## 2015-05-21 MED ORDER — SUCCINYLCHOLINE CHLORIDE 20 MG/ML IJ SOLN
INTRAMUSCULAR | Status: DC | PRN
  Administered 2015-05-21: 100 mg via INTRAVENOUS

## 2015-05-21 MED ORDER — BUPIVACAINE HCL (PF) 0.25 % IJ SOLN
INTRAMUSCULAR | Status: AC
  Filled 2015-05-21: qty 30

## 2015-05-21 MED ORDER — HYDROMORPHONE HCL 1 MG/ML IJ SOLN: 0.2500 mg | INTRAMUSCULAR | Status: DC | PRN

## 2015-05-21 MED ORDER — LIDOCAINE HCL (CARDIAC) 20 MG/ML IV SOLN
INTRAVENOUS | Status: AC
  Filled 2015-05-21: qty 5

## 2015-05-21 MED ORDER — LACTATED RINGERS IR SOLN
Status: DC | PRN
  Administered 2015-05-21: 1000 mL

## 2015-05-21 MED ORDER — IOHEXOL 300 MG/ML  SOLN
INTRAMUSCULAR | Status: DC | PRN
  Administered 2015-05-21: 4 mL

## 2015-05-21 MED ORDER — PHENYLEPHRINE 40 MCG/ML (10ML) SYRINGE FOR IV PUSH (FOR BLOOD PRESSURE SUPPORT)
PREFILLED_SYRINGE | INTRAVENOUS | Status: AC
  Filled 2015-05-21: qty 10

## 2015-05-21 MED ORDER — PROPOFOL 10 MG/ML IV BOLUS
INTRAVENOUS | Status: DC | PRN
  Administered 2015-05-21: 150 mg via INTRAVENOUS

## 2015-05-21 MED ORDER — LACTATED RINGERS IV SOLN: INTRAVENOUS | Status: DC

## 2015-05-21 MED ORDER — FENTANYL CITRATE (PF) 100 MCG/2ML IJ SOLN
INTRAMUSCULAR | Status: DC | PRN
  Administered 2015-05-21: 100 ug via INTRAVENOUS
  Administered 2015-05-21 (×3): 50 ug via INTRAVENOUS

## 2015-05-21 MED ORDER — ROCURONIUM BROMIDE 100 MG/10ML IV SOLN
INTRAVENOUS | Status: DC | PRN
  Administered 2015-05-21 (×2): 10 mg via INTRAVENOUS
  Administered 2015-05-21: 50 mg via INTRAVENOUS

## 2015-05-21 MED ORDER — ONDANSETRON HCL 4 MG/2ML IJ SOLN
INTRAMUSCULAR | Status: AC
  Filled 2015-05-21: qty 2

## 2015-05-21 MED ORDER — PROPOFOL 10 MG/ML IV BOLUS
INTRAVENOUS | Status: AC
  Filled 2015-05-21: qty 20

## 2015-05-21 SURGICAL SUPPLY — 38 items
APPLIER CLIP 5 13 M/L LIGAMAX5 (MISCELLANEOUS)
APPLIER CLIP ROT 10 11.4 M/L (STAPLE) ×2
BENZOIN TINCTURE PRP APPL 2/3 (GAUZE/BANDAGES/DRESSINGS) IMPLANT
CABLE HIGH FREQUENCY MONO STRZ (ELECTRODE) ×2 IMPLANT
CATH REDDICK CHOLANGI 4FR 50CM (CATHETERS) ×2 IMPLANT
CHLORAPREP W/TINT 26ML (MISCELLANEOUS) ×2 IMPLANT
CHOLANGIOGRAM CATH TAUT (CATHETERS) ×2 IMPLANT
CLIP APPLIE 5 13 M/L LIGAMAX5 (MISCELLANEOUS) IMPLANT
CLIP APPLIE ROT 10 11.4 M/L (STAPLE) ×1 IMPLANT
COVER MAYO STAND STRL (DRAPES) ×2 IMPLANT
COVER SURGICAL LIGHT HANDLE (MISCELLANEOUS) IMPLANT
DECANTER SPIKE VIAL GLASS SM (MISCELLANEOUS) ×2 IMPLANT
DRAPE C-ARM 42X120 X-RAY (DRAPES) ×2 IMPLANT
DRAPE LAPAROSCOPIC ABDOMINAL (DRAPES) IMPLANT
ELECT REM PT RETURN 9FT ADLT (ELECTROSURGICAL) ×2
ELECTRODE REM PT RTRN 9FT ADLT (ELECTROSURGICAL) ×1 IMPLANT
GLOVE SURG SIGNA 7.5 PF LTX (GLOVE) ×2 IMPLANT
GOWN STRL REUS W/TWL XL LVL3 (GOWN DISPOSABLE) ×6 IMPLANT
HEMOSTAT SURGICEL 4X8 (HEMOSTASIS) IMPLANT
IV CATH 14GX2 1/4 (CATHETERS) ×2 IMPLANT
IV SET EXTENSION CATH 6 NF (IV SETS) ×2 IMPLANT
KIT BASIN OR (CUSTOM PROCEDURE TRAY) ×2 IMPLANT
LIQUID BAND (GAUZE/BANDAGES/DRESSINGS) ×2 IMPLANT
POUCH SPECIMEN RETRIEVAL 10MM (ENDOMECHANICALS) ×2 IMPLANT
SCISSORS LAP 5X35 DISP (ENDOMECHANICALS) ×2 IMPLANT
SET IRRIG TUBING LAPAROSCOPIC (IRRIGATION / IRRIGATOR) IMPLANT
SLEEVE XCEL OPT CAN 5 100 (ENDOMECHANICALS) ×2 IMPLANT
STOPCOCK 4 WAY LG BORE MALE ST (IV SETS) ×2 IMPLANT
STRIP CLOSURE SKIN 1/4X4 (GAUZE/BANDAGES/DRESSINGS) IMPLANT
SUT MNCRL AB 4-0 PS2 18 (SUTURE) ×2 IMPLANT
SUT MON AB 5-0 PS2 18 (SUTURE) ×2 IMPLANT
SUT VICRYL 0 UR6 27IN ABS (SUTURE) ×2 IMPLANT
TOWEL OR 17X26 10 PK STRL BLUE (TOWEL DISPOSABLE) ×2 IMPLANT
TRAY LAPAROSCOPIC (CUSTOM PROCEDURE TRAY) ×2 IMPLANT
TROCAR ADV FIXATION 5X100MM (TROCAR) ×2 IMPLANT
TROCAR BLADELESS OPT 5 100 (ENDOMECHANICALS) ×2 IMPLANT
TROCAR XCEL BLUNT TIP 100MML (ENDOMECHANICALS) ×2 IMPLANT
TROCAR XCEL NON-BLD 11X100MML (ENDOMECHANICALS) ×2 IMPLANT

## 2015-05-21 NOTE — Anesthesia Preprocedure Evaluation (Addendum)
Anesthesia Evaluation  Patient identified by MRN, date of birth, ID band Patient awake    Reviewed: Allergy & Precautions, H&P , NPO status , Patient's Chart, lab work & pertinent test results, reviewed documented beta blocker date and time   Airway Mallampati: III  TM Distance: >3 FB Neck ROM: full    Dental  (+) Dental Advisory Given, Caps 2 upper front capped:   Pulmonary sleep apnea and Continuous Positive Airway Pressure Ventilation , former smoker,    Pulmonary exam normal breath sounds clear to auscultation       Cardiovascular Exercise Tolerance: Good hypertension, Pt. on medications and Pt. on home beta blockers Normal cardiovascular exam Rhythm:regular Rate:Normal     Neuro/Psych negative neurological ROS  negative psych ROS   GI/Hepatic negative GI ROS, Neg liver ROS,   Endo/Other  diabetes, Well Controlled, Type 2, Insulin Dependent  Renal/GU negative Renal ROS  negative genitourinary   Musculoskeletal   Abdominal (+) + obese,   Peds  Hematology negative hematology ROS (+)   Anesthesia Other Findings   Reproductive/Obstetrics negative OB ROS                            Anesthesia Physical Anesthesia Plan  ASA: III  Anesthesia Plan: General   Post-op Pain Management:    Induction: Intravenous  Airway Management Planned: Oral ETT  Additional Equipment:   Intra-op Plan:   Post-operative Plan: Extubation in OR  Informed Consent: I have reviewed the patients History and Physical, chart, labs and discussed the procedure including the risks, benefits and alternatives for the proposed anesthesia with the patient or authorized representative who has indicated his/her understanding and acceptance.   Dental Advisory Given  Plan Discussed with: CRNA and Surgeon  Anesthesia Plan Comments:         Anesthesia Quick Evaluation

## 2015-05-21 NOTE — Anesthesia Postprocedure Evaluation (Signed)
Anesthesia Post Note  Patient: Joseph Clay  Procedure(s) Performed: Procedure(s) (LRB): LAPAROSCOPIC CHOLECYSTECTOMY WITH INTRAOPERATIVE CHOLANGIOGRAM, BIOPSY RIGHT LOBE OF LIVER (N/A)  Patient location during evaluation: PACU Anesthesia Type: General Level of consciousness: awake and alert Pain management: pain level controlled Vital Signs Assessment: post-procedure vital signs reviewed and stable Respiratory status: spontaneous breathing, nonlabored ventilation, respiratory function stable and patient connected to nasal cannula oxygen Cardiovascular status: blood pressure returned to baseline and stable Postop Assessment: no signs of nausea or vomiting Anesthetic complications: no    Last Vitals:  Filed Vitals:   05/21/15 1215 05/21/15 1222  BP: 137/70 152/69  Pulse: 107 109  Temp:  37 C  Resp: 34 40    Last Pain:  Filed Vitals:   05/21/15 1224  PainSc: 0-No pain                 Pennelope Basque L

## 2015-05-21 NOTE — Progress Notes (Signed)
Patient gone for surgery (cholecystectomy + IOC).  We will revisit tomorrow.

## 2015-05-21 NOTE — Plan of Care (Signed)
Problem: Fluid Volume: Goal: Ability to maintain a balanced intake and output will improve Outcome: Progressing Advancing diet as tolerated. Patient drinking fluids.

## 2015-05-21 NOTE — Anesthesia Procedure Notes (Signed)
Procedure Name: Intubation Date/Time: 05/21/2015 9:49 AM Performed by: Early Osmond E Pre-anesthesia Checklist: Patient identified, Emergency Drugs available, Suction available and Patient being monitored Patient Re-evaluated:Patient Re-evaluated prior to inductionOxygen Delivery Method: Circle system utilized Preoxygenation: Pre-oxygenation with 100% oxygen Intubation Type: IV induction Ventilation: Mask ventilation without difficulty Laryngoscope Size: Glidescope and 3 Grade View: Grade I Tube type: Oral Tube size: 7.5 mm Number of attempts: 1 Airway Equipment and Method: Video-laryngoscopy Placement Confirmation: ETT inserted through vocal cords under direct vision,  positive ETCO2 and breath sounds checked- equal and bilateral Secured at: 21 cm Tube secured with: Tape Dental Injury: Teeth and Oropharynx as per pre-operative assessment  Comments: Elective Glidescope d/t previous intubation with Glidescope. Small oral opening, limited neck mobility.

## 2015-05-21 NOTE — Progress Notes (Signed)
Patient Demographics  Joseph Clay, is a 65 y.o. male, DOB - October 16, 1950, ZOX:[id number]  Admit date - 05/15/2015   Admitting Physician Hillary Bow, DO  Outpatient Primary MD for the patient is [name], MD  LOS - 6   Chief Complaint  Patient presents with  . Abdominal Pain       Admission HPI/Brief narrative: 65 year old male with past medical history of hyperlipidemia, arthritis, chronic back pain, COPD, diabetes mellitus, hypertension, sleep apnea, presents with complaints of crampy abdominal pain, septic in ED, hypotensive with leukocytosis and fever 103.1, has elevated LFTs, CT abdomen pelvis significant for cholelithiasis, abdominal ultrasound significant for cholelithiasis as well, MRCP significant for 3 mm distal CBD stone, no intrahepatic or extrahepatic ductal dilatation, common duct measure up to 5 mm, ERCP with sphincterotomy/papillotomy x2, ERCP with balloon pull-through and attempt at removal of calculus/calculi , and ERCP with balloon dilation, surgery consulted for laparoscopic cholecystectomy, patient with elevated lipase post ERCP, patient underwent laparoscopic cholecystectomy on 05/21/2015. Subjective:   [name] today has, No headache, No chest pain,abd pain, with no change,  denies any cough or shortness of breath.   Assessment & Plan    Principal Problem:   SIRS (systemic inflammatory response syndrome) (HCC) Active Problems:   HTN (hypertension)   DM type 2 (diabetes mellitus, type 2) (HCC)   Abdominal pain   Lactic acidosis   Hyperbilirubinemia   Transaminitis  Sepsis secondary to cholangitis versus CBD stone , and bacteremia - presented with fever, tachycardia, hypotension, leukocytosis and elevated lactic acid - Treated with IV fluid resuscitation, most recent  lactic acid within normal limits - Treated with IV Zosyn, blood culture growing  enterobacter cloacae, sensitive to Zosyn and Rocephin been treated with Zosyn since admission, transitioned to Rocephin 05/19/2015,  stopped IV vancomycin 2/2. - Gastroenterology  greatly appreciated, source most likely to cholangitis versus CBD stone -  MRCP significant for 3 mm distal CBD stone, no intrahepatic or extrahepatic ductal dilatation, common duct measure up to 5 mm, ERCP with sphincterotomy/papillotomy x2, ERCP with balloon pull-through and attempt at removal of calculus/calculi , and ERCP with balloon dilation. - surgery consult appreciated,underwent  laparoscopic cholecystectomy today by Dr. Ezzard Standing ,  no filling defects on cholangiogram .finding of nodule of right lobe of the liver , biopsy were taken .  Incidental finding of nodule of right lobe of liver during surgery  - Follow up on biopsy results   Pancreatitis  - Lipase peaked at 3262, post ERCP pancreatitis,trending down   Elevated LFTs - improving   Hyperkalemia - Received D50, IV calcium gluconate, Kayexalate - Resolved  Diabetes mellitus - Hold oral hypoglycemic agent, continue with insulin sliding scale  Hyperlipidemia - Hold statin in the setting of elevated LFTs  Hypertension - Labile, initially hypotensive, home meds has been on hold ,  , increased home dose of amlodipine from 5-10 mg , better controlled after starting on by mouth hydralazine, as well on when necessary hydralazine .  protonged QTc - And with significant hypokalemia and hypomagnesemia, resolved on repeat EKG this a.m. after repleting  Hypomagnesemia/hypokalemia - Repleted,   Prostate hypertrophy with nodular appearance on imaging. - PSA WNL  Code Status: Full  Family Communication: Daughter at bedside  Disposition Plan: Remains in stepdown   Procedures  ERCP 2/2 LAPAROSCOPIC CHOLECYSTECTOMY WITH INTRAOPERATIVE CHOLANGIOGRAM, BIOPSY RIGHT LOBE OF LIVER 2/6 by Dr. Ezzard Standing   Consults   Gastroenterology Gen.  surgery  Medications  Scheduled Meds: . amLODipine  10 mg Oral Daily  . antiseptic oral rinse  7 mL Mouth Rinse q12n4p  . cefTRIAXone (ROCEPHIN)  IV  2 g Intravenous Q24H  . chlorhexidine  15 mL Mouth Rinse BID  . enoxaparin (LOVENOX) injection  65 mg Subcutaneous Q24H  . hydrALAZINE  50 mg Oral 4 times per day  . insulin aspart  0-9 Units Subcutaneous 6 times per day  . pantoprazole (PROTONIX) IV  40 mg Intravenous Q12H  . sodium chloride flush  3 mL Intravenous Q12H   Continuous Infusions:   PRN Meds:.acetaminophen **OR** acetaminophen, HYDROcodone-acetaminophen, metoprolol, morphine injection  DVT Prophylaxis  Lovenox   Lab Results  Component Value Date   PLT 277 05/21/2015    Antibiotics   Anti-infectives    Start     Dose/Rate Route Frequency Ordered Stop   05/19/15 1200  cefTRIAXone (ROCEPHIN) 2 g in dextrose 5 % 50 mL IVPB     2 g 100 mL/hr over 30 Minutes Intravenous Every 24 hours 05/19/15 1130     05/16/15 2000  vancomycin (VANCOCIN) 1,500 mg in sodium chloride 0.9 % 500 mL IVPB  Status:  Discontinued     1,500 mg 250 mL/hr over 120 Minutes Intravenous Every 24 hours 05/16/15 0935 05/17/15 0704   05/16/15 1000  vancomycin (VANCOCIN) 1,250 mg in sodium chloride 0.9 % 250 mL IVPB  Status:  Discontinued     1,250 mg 166.7 mL/hr over 90 Minutes Intravenous Every 12 hours 05/15/15 2102 05/16/15 0935   05/16/15 0600  piperacillin-tazobactam (ZOSYN) IVPB 3.375 g  Status:  Discontinued     3.375 g 12.5 mL/hr over 240 Minutes Intravenous Every 8 hours 05/15/15 2100 05/19/15 1130   05/15/15 2000  vancomycin (VANCOCIN) 2,000 mg in sodium chloride 0.9 % 500 mL IVPB     2,000 mg 250 mL/hr over 120 Minutes Intravenous  Once 05/15/15 1956 05/15/15 2300   05/15/15 2000  piperacillin-tazobactam (ZOSYN) IVPB 3.375 g     3.375 g 100 mL/hr over 30 Minutes Intravenous  Once 05/15/15 1956 05/16/15 0019          Objective:   Filed Vitals:   05/21/15 1215 05/21/15 1222  05/21/15 1236 05/21/15 1300  BP: 137/70 152/69 139/58 143/64  Pulse: 107 109 108 104  Temp:  98.6 F (37 C)    TempSrc:      Resp: 34 40 34 28  Height:      Weight:      SpO2: 95% 95% 94% 94%    Wt Readings from Last 3 Encounters:  05/21/15 132.5 kg (292 lb 1.8 oz)  05/16/15 131.997 kg (291 lb)  03/25/13 133.811 kg (295 lb)     Intake/Output Summary (Last 24 hours) at 05/21/15 1402 Last data filed at 05/21/15 1212  Gross per 24 hour  Intake    893 ml  Output   2175 ml  Net  -1282 ml     Physical Exam  Awake Alert, Oriented X 3, No new F.N deficits, Normal affect [place].AT,PERRAL Supple Neck,No JVD,  Symmetrical Chest wall movement, Good air movement bilaterally, CTAB RRR,No Gallops,Rubs or new Murmurs, No Parasternal Heave +ve B.Sounds, Abd Soft, mild diffuse tenderness  , No rebound - guarding or rigidity. No Cyanosis, Clubbing, no  edema,  No new Rash or bruise     Data Review   Micro Results Recent Results (from the past 240 hour(s))  Blood culture (routine x 2)     Status: None   Collection Time: 05/15/15  8:41 PM  Result Value Ref Range Status   Specimen Description BLOOD RIGHT HAND  Final   Special Requests BOTTLES DRAWN AEROBIC AND ANAEROBIC 5CC EACH  Final   Culture  Setup Time   Final    GRAM NEGATIVE RODS ANAEROBIC BOTTLE ONLY CRITICAL RESULT CALLED TO, READ BACK BY AND VERIFIED WITH: Claris Gladden RN 2011 05/16/15 A BROWNING    Culture   Final    ENTEROBACTER CLOACAE Performed at [facility]    Report Status 05/18/2015 FINAL  Final   Organism ID, Bacteria ENTEROBACTER CLOACAE  Final      Susceptibility   Enterobacter cloacae - MIC*    CEFAZOLIN >=64 RESISTANT Resistant     CEFEPIME <=1 SENSITIVE Sensitive     CEFTAZIDIME <=1 SENSITIVE Sensitive     CEFTRIAXONE <=1 SENSITIVE Sensitive     CIPROFLOXACIN <=0.25 SENSITIVE Sensitive     GENTAMICIN <=1 SENSITIVE Sensitive     IMIPENEM <=0.25 SENSITIVE Sensitive     TRIMETH/SULFA <=20 SENSITIVE  Sensitive     PIP/TAZO <=4 SENSITIVE Sensitive     * ENTEROBACTER CLOACAE  MRSA PCR Screening     Status: None   Collection Time: 05/15/15 11:45 PM  Result Value Ref Range Status   MRSA by PCR NEGATIVE NEGATIVE Final    Comment:        The GeneXpert MRSA Assay (FDA approved for NASAL specimens only), is one component of a comprehensive MRSA colonization surveillance program. It is not intended to diagnose MRSA infection nor to guide or monitor treatment for MRSA infections.   Blood culture (routine x 2)     Status: None (Preliminary result)   Collection Time: 05/16/15  3:48 AM  Result Value Ref Range Status   Specimen Description BLOOD LEFT ARM  Final   Special Requests BOTTLES DRAWN AEROBIC AND ANAEROBIC 5CC  Final   Culture   Final    NO GROWTH 4 DAYS Performed at [facility]    Report Status PENDING  Incomplete    Radiology Reports Dg Cholangiogram Operative  05/21/2015  CLINICAL DATA:  Status post cholecystectomy EXAM: INTRAOPERATIVE CHOLANGIOGRAM TECHNIQUE: Cholangiographic images from the C-arm fluoroscopic device were submitted for interpretation post-operatively. Please see the procedural report for the amount of contrast and the fluoroscopy time utilized. COMPARISON:  05/17/2015 FINDINGS: Injection in the cystic duct remnant reveals no filling defect and free flow contrast into the duodenum. There is a relatively low insertion of the right hepatic duct. IMPRESSION: No retained filling defects identified. Electronically Signed   By: Alcide Clever M.D.   On: 05/21/2015 11:20   Ct Abdomen Pelvis W Contrast  05/15/2015  CLINICAL DATA:  Mid abdominal pain and cramping. Shortness of breath. EXAM: CT ABDOMEN AND PELVIS WITH CONTRAST TECHNIQUE: Multidetector CT imaging of the abdomen and pelvis was performed using the standard protocol following bolus administration of intravenous contrast. CONTRAST:  OMNIPAQUE IOHEXOL 300 MG/ML  SOLN COMPARISON:  05/31/2012 FINDINGS:  Lower chest:  No acute findings. Hepatobiliary: 1.3 cm calcified gallstone without significant gallbladder distention or wall thickening. Normal appearance of liver and the portal venous system is patent. Pancreas: Normal appearance of the pancreas without inflammation or duct dilatation. Spleen: Normal appearance of spleen without enlargement. Adrenals/Urinary Tract: Many of  the images are limited by motion artifact. There is a stable exophytic 1.1 cm low-density structure in the lateral right kidney which probably represents a small exophytic cyst. Question another small cyst in the anterior right kidney. Normal appearance of the adrenal glands. No hydronephrosis or kidney stones. Normal appearance of the urinary bladder. Probable small cyst in the anterior left kidney that is too small to definitively characterize. Stomach/Bowel: Scattered colonic diverticular without acute inflammation. Mild dilatation of proximal jejunum. No evidence for obstruction. Vascular/Lymphatic: Again noted are prominent peri-aortic lymph nodes. Index lesion anterior to the aorta measures 1.3 x 1.1 cm on sequence 2, image 41 and previously measured 1.2 x 1.1 cm. The mild retroperitoneal lymphadenopathy has not significantly changed from the prior examination. Atherosclerotic calcifications in the abdominal aorta without aneurysm. Reproductive: Prostate is large for size measuring 5.5 x 6.5 cm on sequence 2, image 87. Prostate is nodular along the bladder base. Calcifications along the posterior aspect of the prostate. Other: No free fluid. Musculoskeletal: Multilevel degenerative facet disease in lumbar spine. No acute bone abnormality. IMPRESSION: No acute abnormality in the abdomen or pelvis. Cholelithiasis.  No evidence for gallbladder inflammation. Prostate hypertrophy.  Recommend correlation with PSA level. Electronically Signed   By: Richarda Overlie M.D.   On: 05/15/2015 19:30   Dg Chest Port 1 View  05/19/2015  CLINICAL DATA:   Respiratory difficulty. EXAM: PORTABLE CHEST 1 VIEW COMPARISON:  05/15/2015 FINDINGS: Lung volume are low. Cardiomediastinal contours are unchanged. Probable vascular congestion. Linear atelectasis in the lower lobes, unchanged from prior exam. No new airspace disease. No large pleural effusion or pneumothorax. Osseous structures are unchanged. IMPRESSION: Mild vascular congestion.  Unchanged bibasilar atelectasis. Electronically Signed   By: Rubye Oaks M.D.   On: 05/19/2015 19:24   Dg Chest Port 1 View  05/15/2015  CLINICAL DATA:  Patient greater mid abdominal pain and cramping. Some shortness of breath. Former smoker. History of hypertension and diabetes. EXAM: PORTABLE CHEST 1 VIEW COMPARISON:  03/25/2013 FINDINGS: There is mild opacity at the lung bases most likely atelectasis. No evidence of pulmonary edema. No apparent pleural effusion and no evidence of a pneumothorax. Cardiac silhouette is normal in size. No mediastinal or hilar masses or evidence of adenopathy. Bony thorax is grossly intact. IMPRESSION: 1. No acute cardiopulmonary disease. 2. Mild lung base atelectasis. Electronically Signed   By: Amie Portland M.D.   On: 05/15/2015 21:14   Dg Ercp Biliary & Pancreatic Ducts  05/17/2015  CLINICAL DATA:  Cholelithiasis with possible choledocholithiasis. EXAM: ERCP TECHNIQUE: Multiple spot images obtained with the fluoroscopic device and submitted for interpretation post-procedure. COMPARISON:  MRCP study earlier today. FINDINGS: Imaging from an ERCP procedure with a C-arm demonstrates cannulation of the common bile duct. Contrast injection shows a normal caliber duct and visualized intrahepatic ducts. No filling defects are seen on the submitted imaging. No evidence of contrast extravasation. IMPRESSION: Normal caliber bile ducts by ERCP with no filling defects identified. These images were submitted for radiologic interpretation only. Please see the procedural report for the amount of contrast  and the fluoroscopy time utilized. Electronically Signed   By: Irish Lack M.D.   On: 05/17/2015 13:05   Mr Abd W/wo Cm/mrcp  05/17/2015  CLINICAL DATA:  Mid lower abdominal pain, nausea, gallstone. EXAM: MRI ABDOMEN WITHOUT AND WITH CONTRAST (INCLUDING MRCP) TECHNIQUE: Multiplanar multisequence MR imaging of the abdomen was performed both before and after the administration of intravenous contrast. Heavily T2-weighted images of the biliary and pancreatic ducts  were obtained, and three-dimensional MRCP images were rendered by post processing. CONTRAST:  20mL MULTIHANCE GADOBENATE DIMEGLUMINE 529 MG/ML IV SOLN COMPARISON:  Right upper quadrant ultrasound dated 05/15/2015. CT abdomen pelvis dated 05/15/2015. FINDINGS: Motion degraded images. Significant dielectric effect/standing wave artifact in the central abdomen related to patient body habitus and imaging at 3T. Lower chest:  Lung bases are clear. Hepatobiliary: Mild hepatic steatosis. No suspicious/enhancing hepatic lesions. Layering 1.4 cm gallstone (series 15/ image 21), without associated gallbladder wall thickening or inflammatory changes. Common duct is obscured on many sequences but is well visualized on the axial T2 Fiesta sequence (series 13), were it is nondilated, measuring up to 5 mm. A tiny 3 mm distal CBD stone may be present (series 13/ image 31). Pancreas: Within normal limits. Spleen: Within normal limits. Adrenals/Urinary Tract: Bilateral adrenal glands are within normal limits. Kidneys are within normal limits.  No hydronephrosis. Stomach/Bowel: Stomach and visualized bowel are unremarkable. Vascular/Lymphatic: No evidence abdominal aortic aneurysm. No suspicious abdominal lymphadenopathy. Other: No abdominal ascites. Musculoskeletal: No focal osseous lesions. IMPRESSION: Motion degraded images with significant artifact in the central abdomen, as above. Cholelithiasis, without evidence of acute cholecystitis. No intrahepatic or  extrahepatic ductal dilatation. Common duct measures up to 5 mm. Possible 3 mm distal CBD stone, equivocal. Mild hepatic steatosis. Electronically Signed   By: Charline Bills M.D.   On: 05/17/2015 08:08   US Abdomen Limited Ruq  05/15/2015  CLINICAL DATA:  Abdominal pain, cramping. Elevated liver function tests. EXAM: US ABDOMEN LIMITED - RIGHT UPPER QUADRANT COMPARISON:  CT abdomen and pelvis May 15, 2015 at 1913 hours FINDINGS: Limited assessment due to large body habitus. Gallbladder: Echogenic gallstone with acoustic shadowing. Gallbladder wall is mildly thickened at 3-4 mm. Trace pericholecystic fluid. No sonographic Murphy's sign elicited. Common bile duct: Diameter: 3.4 mm. Liver: No focal lesion identified. Diffusely echogenic. Hepatopetal portal vein. IMPRESSION: Habitus limited examination. Cholelithiasis and sonographic findings of mild cholecystitis though, no sonographic Murphy's sign elicited. Hepatic steatosis. Electronically Signed   By: Awilda Metro M.D.   On: 05/15/2015 21:12     CBC  Recent Labs Lab 05/15/15 2043  05/17/15 0333 05/18/15 0343 05/19/15 0341 05/20/15 0403 05/21/15 0708  WBC 15.2*  < > 9.5 7.6 11.1* 12.0* 13.1*  HGB 13.0  < > 11.2* 10.9* 12.4* 11.6* 12.3*  HCT 40.1  < > 34.7* 33.9* 37.3* 36.2* 39.3  PLT 191  < > 157 146* 191 220 277  MCV 84.1  < > 85.7 84.3 82.9 84.6 85.1  MCH 27.3  < > 27.7 27.1 27.6 27.1 26.6  MCHC 32.4  < > 32.3 32.2 33.2 32.0 31.3  RDW 14.9  < > 15.5 15.3 15.4 16.0* 16.3*  LYMPHSABS 0.6*  --   --  0.8 0.8 0.9  --   MONOABS 0.5  --   --  0.5 0.8 1.0  --   EOSABS 0.0  --   --  0.1 0.0 0.1  --   BASOSABS 0.0  --   --  0.0 0.0 0.0  --   < > = values in this interval not displayed.  Chemistries   Recent Labs Lab 05/17/15 0333 05/18/15 0343 05/19/15 0341 05/19/15 2256 05/20/15 0403 05/21/15 0708  NA 141 139 142  --  137 139  K 4.2 3.9 3.7 3.1* 3.3* 3.5  CL 107 107 107  --  101 100*  CO2 --  24 26  GLUCOSE  160* 151* 161*  --  136* 145*  BUN 11 12 9   --  10 13  CREATININE 1.03 0.99 0.80  --  0.99 0.99  CALCIUM 8.5* 8.1* 8.8*  --  8.6* 8.8*  MG  --   --   --  1.4*  --   --   AST 87* 52* 64*  --  41 33  ALT 152* 121* 117*  --  90* 64*  ALKPHOS 118 133* 179*  --  153* 144*  BILITOT 5.9* 6.4* 7.2*  --  4.9* 3.0*   ------------------------------------------------------------------------------------------------------------------ estimated creatinine clearance is 106.2 mL/min (by C-G formula based on Cr of 0.99). ------------------------------------------------------------------------------------------------------------------ No results for input(s): HGBA1C in the last 72 hours. ------------------------------------------------------------------------------------------------------------------ No results for input(s): CHOL, HDL, LDLCALC, TRIG, CHOLHDL, LDLDIRECT in the last 72 hours. ------------------------------------------------------------------------------------------------------------------ No results for input(s): TSH, T4TOTAL, T3FREE, THYROIDAB in the last 72 hours.  Invalid input(s): FREET3 ------------------------------------------------------------------------------------------------------------------ No results for input(s): VITAMINB12, FOLATE, FERRITIN, TIBC, IRON, RETICCTPCT in the last 72 hours.  Coagulation profile  Recent Labs Lab 05/17/15 0920  INR 1.17    No results for input(s): DDIMER in the last 72 hours.  Cardiac Enzymes No results for input(s): CKMB, TROPONINI, MYOGLOBIN in the last 168 hours.  Invalid input(s): CK ------------------------------------------------------------------------------------------------------------------ Invalid input(s): POCBNP     Time Spent in minutes   30 minutes   ELGERGAWY, DAWOOD M.D on 05/21/2015 at 2:02 PM  Between 7am to 7pm - Pager - [phone]  After 7pm go to [email or web address] - password [facility]  [facility]    Office  [phone]

## 2015-05-21 NOTE — Progress Notes (Signed)
PT Cancellation Note  Patient Details Name: Joseph Clay MRN: [id number] DOB: 1950/12/01   Cancelled Treatment:    Reason Eval/Treat Not Completed: Patient at procedure or test/unavailable   Rada Hay 05/21/2015, 9:26 AM Blanchard Kelch PT [phone]

## 2015-05-21 NOTE — Progress Notes (Signed)
Pt states that he will contact RT when ready for CPAP qhs.  RT will continue to monitor as needed.

## 2015-05-21 NOTE — Op Note (Signed)
05/15/2015 - 05/21/2015  11:42 AM  PATIENT:  Joseph Clay, 65 y.o., male, MRN: [id number]  PREOP DIAGNOSIS:  Cholithiasis, chronic cholecystitis  POSTOP DIAGNOSIS:   Cholithiasis, chronic cholecystitis, cystic duct comes off the right hepatic duct, no filling defect, nodule of right lobe of liver (photo in chart)  PROCEDURE:   Procedure(s):  LAPAROSCOPIC CHOLECYSTECTOMY WITH INTRAOPERATIVE CHOLANGIOGRAM, BIOPSY RIGHT LOBE OF LIVER  SURGEON:   Ovidio Kin, M.D.  ASSISTANT:   Huntley Dec, PA student  ANESTHESIA:   general  CRNA: Vista Lawman, CRNA  General  ASA: 3E  EBL:  minimal  ml  BLOOD ADMINISTERED: none  DRAINS: none   LOCAL MEDICATIONS USED:   30 cc 1/4% marcaine  SPECIMEN:   Gall bladder  COUNTS CORRECT:  YES  INDICATIONS FOR PROCEDURE:  Joseph Clay is a 65 y.o. (DOB: 1950-08-09) white  male whose primary care physician is [name], MD and comes for cholecystectomy.   He was admitted 05/15/2015 for sepsis, thought to be biliary in nature.  He was found to have gallstones and elevated LFTs.   He underwent an ERCP on 05/17/2015 by Dr. Ewing Schlein and developed post op pancreatitis.  His lipase has return to close to normal and he is asymptomatic.  I discussed with him about proceeding with lap chole.   The indications and risks of the gall bladder surgery were explained to the patient.  The risks include, but are not limited to, infection, bleeding, common bile duct injury and open surgery.  SURGERY:  The patient was taken to room #1 at [facility].  The abdomen was prepped with chloroprep.  The patient was on Rocephin the beginning of the operation.   A time out was held and the surgical checklist run.   An infraumbilical incision was made into the abdominal cavity.  A 12 mm Hasson trocar was inserted into the abdominal cavity through the infraumbilical incision and secured with a 0 Vicryl suture.  Three additional trocars were inserted: a 10 mm  trocar in the sub-xiphoid location, a 5 mm trocar in the right mid subcostal area, and a 5 mm trocar in the right lateral subcostal area.  I placed an additional 5 mm trocar midway between his xiphoid and umbilicus.   The abdomen was dominated by his omentum.  The stomach and bowel that could be seen were unremarkable.  He had a couple of nodules on the left and right lobe of the liver.  I think that these are benign.  I took pictures of the liver and placed them in the paper chart.   The gall bladder had mild changes of chronic cholecystitis.  I grasped, and rotated cephalad.  He has a large liver, which would not allow the liver to rotate.  I placed the 5th trocar to help with exposure.  Disssection was carried down to the gall bladder/cystic duct junction and the cystic duct isolated.  A clip was placed on the gall bladder side of the cystic duct.   An intra-operative cholangiogram was shot.   The intra-operative cholangiogram was shot using a cut off Taut catheter placed through a 14 gauge angiocath in the RUQ.  The Taut catheter was inserted in the cut cystic duct and secured with an endoclip.  A cholangiogram was shot with 10 cc of 1/2 strength Omnipaque.  Using fluoroscopy, the cholangiogram showed the flow of contrast into the common bile duct, up the hepatic radicals, and into the duodenum.  There was no  mass or obstruction.  The cystic duct came off the right hepatic duct.   The Taut catheter was removed.  The cystic duct was tripley endoclipped and the cystic artery was identified and clipped.  The gall bladder was bluntly and sharpley dissected from the gall bladder bed.   He had a couple of nodules on the right lobe of the liver and at least one nodule on the left lobe of the liver.  I took photos and placed these in the paper chart.  I biopsied the largest nodule lateral to the gall bladder bed on the undersurface of the right lobe of the liver.   After the gall bladder was removed from the  liver, the gall bladder bed and Triangle of [place] were inspected.  There was no bleeding or bile leak.  The gall bladder was placed in a endocatch bag and delivered through the umbilicus.  The abdomen was irrigated with 1,000 cc saline.   The trocars were then removed.  I infiltrated 30cc of 1/4% Marcaine into the incisions.  The umbilical port closed with a 0 Vicryl suture and the skin closed with 4-0 Monocryl.  The skin was painted with LiquidBand.  The patient's sponge and needle count were correct.  The patient was transported to the RR in good condition.  Ovidio Kin, MD, [facility] Pager: [phone] Office phone:  [phone]

## 2015-05-21 NOTE — Transfer of Care (Signed)
Immediate Anesthesia Transfer of Care Note  Patient: Joseph Clay  Procedure(s) Performed: Procedure(s): LAPAROSCOPIC CHOLECYSTECTOMY WITH INTRAOPERATIVE CHOLANGIOGRAM, BIOPSY RIGHT LOBE OF LIVER (N/A)  Patient Location: PACU  Anesthesia Type:General  Level of Consciousness:  sedated, patient cooperative and responds to stimulation  Airway & Oxygen Therapy:Patient Spontanous Breathing and Patient connected to face mask oxgen  Post-op Assessment:  Report given to PACU RN and Post -op Vital signs reviewed and stable  Post vital signs:  Reviewed and stable  Last Vitals:  Filed Vitals:   05/21/15 0600 05/21/15 0800  BP: 121/62 107/58  Pulse: 101 103  Temp:  36.6 C  Resp: 37 23    Complications: No apparent anesthesia complications

## 2015-05-21 NOTE — Progress Notes (Addendum)
[facility] Office:  [phone] General Surgery Progress Note   LOS: 6 days  POD -  4 Days Post-Op  PCP - J. Griffin  Assessment/Plan: 1.  Gall stones  I discussed with the patient the indications and risks of gall bladder surgery.  The primary risks of gall bladder surgery include, but are not limited to, bleeding, infection, common bile duct injury, and open surgery.  There is also the risk that the patient may have continued symptoms after surgery.  We discussed the typical post-operative recovery course. I tried to answer the patient's questions.  Will go ahead with surgery.  His daughter, Misty Stanley, is coming from [employer].  Her cell:  [phone].  I spoke to her on the phone.  2.  Sepsis - biliary  On Rocephin  3.  ENDOSCOPIC RETROGRADE CHOLANGIOPANCREATOGRAPHY (ERCP) - 05/17/2015 - [facility]  No stones seen  T. Bili - 4.9 - 05/20/2015  4.  Post ERCP pancreatitis  Lipase - 85 - 05/20/2015  Back up today a little to 115 (05/21/2015)  It does not sound like he was that symptomatic from his pancreatitis. 5.  DM 6.  HTN 7.  DVT prophylaxis - Lovenox 8.  OSA - on CPAP   Principal Problem:   SIRS (systemic inflammatory response syndrome) (HCC) Active Problems:   HTN (hypertension)   DM type 2 (diabetes mellitus, type 2) (HCC)   Abdominal pain   Lactic acidosis   Hyperbilirubinemia   Transaminitis   Subjective:  Alert. Moves slowly, but he said he has been at bedrest since his admission.  In room by himself.  Objective:   Filed Vitals:   05/21/15 0400 05/21/15 0600  BP: 146/57 121/62  Pulse: 104 101  Temp:    Resp: 27 37     Intake/Output from previous day:  02/05 0701 - 02/06 0700 In: 533 [P.O.:480; I.V.:3; IV Piggyback:50] Out: 3925 [Urine:3925]  Intake/Output this shift:      Physical Exam:   General: Obese WM who is alert.  He looks okay, though moves slowly.   HEENT: Normal. Pupils equal. .   Lungs: Clear.   Abdomen: Soft.  Obese.  BS  present.   Lab Results:    Recent Labs  05/20/15 0403 05/21/15 0708  WBC 12.0* 13.1*  HGB 11.6* 12.3*  HCT 36.2* 39.3  PLT 220 277    BMET   Recent Labs  05/19/15 0341 05/19/15 2256 05/20/15 0403  NA 142  --  137  K 3.7 3.1* 3.3*  CL 107  --  101  CO2 24  --  24  GLUCOSE 161*  --  136*  BUN 9  --  10  CREATININE 0.80  --  0.99  CALCIUM 8.8*  --  8.6*    PT/INR  No results for input(s): LABPROT, INR in the last 72 hours.  ABG  No results for input(s): PHART, HCO3 in the last 72 hours.  Invalid input(s): PCO2, PO2   Studies/Results:  Dg Chest Port 1 View  05/19/2015  CLINICAL DATA:  Respiratory difficulty. EXAM: PORTABLE CHEST 1 VIEW COMPARISON:  05/15/2015 FINDINGS: Lung volume are low. Cardiomediastinal contours are unchanged. Probable vascular congestion. Linear atelectasis in the lower lobes, unchanged from prior exam. No new airspace disease. No large pleural effusion or pneumothorax. Osseous structures are unchanged. IMPRESSION: Mild vascular congestion.  Unchanged bibasilar atelectasis. Electronically Signed   By: Rubye Oaks M.D.   On: 05/19/2015 19:24     Anti-infectives:   Anti-infectives  Start     Dose/Rate Route Frequency Ordered Stop   05/19/15 1200  cefTRIAXone (ROCEPHIN) 2 g in dextrose 5 % 50 mL IVPB     2 g 100 mL/hr over 30 Minutes Intravenous Every 24 hours 05/19/15 1130     05/16/15 2000  vancomycin (VANCOCIN) 1,500 mg in sodium chloride 0.9 % 500 mL IVPB  Status:  Discontinued     1,500 mg 250 mL/hr over 120 Minutes Intravenous Every 24 hours 05/16/15 0935 05/17/15 0704   05/16/15 1000  vancomycin (VANCOCIN) 1,250 mg in sodium chloride 0.9 % 250 mL IVPB  Status:  Discontinued     1,250 mg 166.7 mL/hr over 90 Minutes Intravenous Every 12 hours 05/15/15 2102 05/16/15 0935   05/16/15 0600  piperacillin-tazobactam (ZOSYN) IVPB 3.375 g  Status:  Discontinued     3.375 g 12.5 mL/hr over 240 Minutes Intravenous Every 8 hours 05/15/15 2100  05/19/15 1130   05/15/15 2000  vancomycin (VANCOCIN) 2,000 mg in sodium chloride 0.9 % 500 mL IVPB     2,000 mg 250 mL/hr over 120 Minutes Intravenous  Once 05/15/15 1956 05/15/15 2300   05/15/15 2000  piperacillin-tazobactam (ZOSYN) IVPB 3.375 g     3.375 g 100 mL/hr over 30 Minutes Intravenous  Once 05/15/15 1956 05/16/15 0019      Ovidio Kin, MD, FACS Pager: [phone] [facility] Office: [phone] 05/21/2015

## 2015-05-21 NOTE — Progress Notes (Signed)
Pt stated that he was not ready for bed yet, and asked that I return after 2330 to help him with cpap tonight.  RT will return at this time per pt request.

## 2015-05-22 LAB — GLUCOSE, CAPILLARY
GLUCOSE-CAPILLARY: 141 mg/dL — AB (ref 65–99)
Glucose-Capillary: 180 mg/dL — ABNORMAL HIGH (ref 65–99)
Glucose-Capillary: 188 mg/dL — ABNORMAL HIGH (ref 65–99)
Glucose-Capillary: 214 mg/dL — ABNORMAL HIGH (ref 65–99)
Glucose-Capillary: 219 mg/dL — ABNORMAL HIGH (ref 65–99)

## 2015-05-22 LAB — COMPREHENSIVE METABOLIC PANEL
ALK PHOS: 134 U/L — AB (ref 38–126)
ALT: 70 U/L — AB (ref 17–63)
AST: 50 U/L — ABNORMAL HIGH (ref 15–41)
Albumin: 2.6 g/dL — ABNORMAL LOW (ref 3.5–5.0)
Anion gap: 12 (ref 5–15)
BUN: 15 mg/dL (ref 6–20)
CHLORIDE: 99 mmol/L — AB (ref 101–111)
CO2: 28 mmol/L (ref 22–32)
CREATININE: 1.03 mg/dL (ref 0.61–1.24)
Calcium: 8.6 mg/dL — ABNORMAL LOW (ref 8.9–10.3)
GFR calc Af Amer: >60 mL/min (ref >60)
GFR calc non Af Amer: >60 mL/min (ref >60)
GLUCOSE: 145 mg/dL — AB (ref 65–99)
Potassium: 3.3 mmol/L — ABNORMAL LOW (ref 3.5–5.1)
SODIUM: 139 mmol/L (ref 135–145)
Total Bilirubin: 2 mg/dL — ABNORMAL HIGH (ref 0.3–1.2)
Total Protein: 6.4 g/dL — ABNORMAL LOW (ref 6.5–8.1)

## 2015-05-22 LAB — CBC
HEMATOCRIT: 36.8 % — AB (ref 39.0–52.0)
Hemoglobin: 11.5 g/dL — ABNORMAL LOW (ref 13.0–17.0)
MCH: 27 pg (ref 26.0–34.0)
MCHC: 31.3 g/dL (ref 30.0–36.0)
MCV: 86.4 fL (ref 78.0–100.0)
Platelets: 247 10*3/uL (ref 150–400)
RBC: 4.26 MIL/uL (ref 4.22–5.81)
RDW: 16.2 % — AB (ref 11.5–15.5)
WBC: 9.8 10*3/uL (ref 4.0–10.5)

## 2015-05-22 LAB — PHOSPHORUS: Phosphorus: 3.4 mg/dL (ref 2.5–4.6)

## 2015-05-22 LAB — LIPASE, BLOOD: Lipase: 126 U/L — ABNORMAL HIGH (ref 11–51)

## 2015-05-22 MED ORDER — INSULIN ASPART 100 UNIT/ML ~~LOC~~ SOLN
0.0000 [IU] | Freq: Three times a day (TID) | SUBCUTANEOUS | Status: DC
  Administered 2015-05-22 – 2015-05-23 (×2): 5 [IU] via SUBCUTANEOUS

## 2015-05-22 MED ORDER — POTASSIUM CHLORIDE 20 MEQ/15ML (10%) PO SOLN
40.0000 meq | Freq: Once | ORAL | Status: DC
  Filled 2015-05-22: qty 30

## 2015-05-22 MED ORDER — FUROSEMIDE 10 MG/ML IJ SOLN
40.0000 mg | Freq: Once | INTRAMUSCULAR | Status: AC
  Administered 2015-05-22: 40 mg via INTRAVENOUS
  Filled 2015-05-22: qty 4

## 2015-05-22 MED ORDER — FUROSEMIDE 40 MG PO TABS
40.0000 mg | ORAL_TABLET | Freq: Every day | ORAL | Status: DC
  Filled 2015-05-22: qty 1

## 2015-05-22 MED ORDER — POTASSIUM CHLORIDE CRYS ER 20 MEQ PO TBCR
40.0000 meq | EXTENDED_RELEASE_TABLET | Freq: Once | ORAL | Status: AC
  Administered 2015-05-22: 40 meq via ORAL
  Filled 2015-05-22: qty 2

## 2015-05-22 NOTE — Discharge Instructions (Signed)
Laparoscopic Cholecystectomy, Care After °Refer to this sheet in the next few weeks. These instructions provide you with information about caring for yourself after your procedure. Your health care provider may also give you more specific instructions. Your treatment has been planned according to current medical practices, but problems sometimes occur. Call your health care provider if you have any problems or questions after your procedure. °WHAT TO EXPECT AFTER THE PROCEDURE °After your procedure, it is common to have: °· Pain at your incision sites. You will be given pain medicines to control your pain. °· Mild nausea or vomiting. This should improve after the first 24 hours. °· Bloating and possible shoulder pain from the gas that was used during the procedure. This will improve after the first 24 hours. °HOME CARE INSTRUCTIONS °Incision Care °· Follow instructions from your health care provider about how to take care of your incisions. Make sure you: °¨ Wash your hands with soap and water before you change your bandage (dressing). If soap and water are not available, use hand sanitizer. °¨ Change your dressing as told by your health care provider. °¨ Leave stitches (sutures), skin glue, or adhesive strips in place. These skin closures may need to be in place for 2 weeks or longer. If adhesive strip edges start to loosen and curl up, you may trim the loose edges. Do not remove adhesive strips completely unless your health care provider tells you to do that. °· Do not take baths, swim, or use a hot tub until your health care provider approves. Ask your health care provider if you can take showers. You may only be allowed to take sponge baths for bathing. °General Instructions °· Take over-the-counter and prescription medicines only as told by your health care provider. °· Do not drive or operate heavy machinery while taking prescription pain medicine. °· Return to your normal diet as told by your health care  provider. °· Do not lift anything that is heavier than 10 lb (4.5 kg). °· Do not play contact sports for one week or until your health care provider approves. °SEEK MEDICAL CARE IF:  °· You have redness, swelling, or pain at the site of your incision. °· You have fluid, blood, or pus coming from your incision. °· You notice a bad smell coming from your incision area. °· Your surgical incisions break open. °· You have a fever. °SEEK IMMEDIATE MEDICAL CARE IF: °· You develop a rash. °· You have difficulty breathing. °· You have chest pain. °· You have increasing pain in your shoulders (shoulder strap areas). °· You faint or have dizzy episodes while you are standing. °· You have severe pain in your abdomen. °· You have nausea or vomiting that lasts for more than one day. °  °This information is not intended to replace advice given to you by your health care provider. Make sure you discuss any questions you have with your health care provider. °  °Document Released: 03/31/2005 Document Revised: 12/20/2014 Document Reviewed: 11/10/2012 °Elsevier Interactive Patient Education ©2016 [employer] °CCS ______[facility] °LAPAROSCOPIC SURGERY: POST OP INSTRUCTIONS °Always review your discharge instruction sheet given to you by the facility where your surgery was performed. °IF YOU HAVE DISABILITY OR FAMILY LEAVE FORMS, YOU MUST BRING THEM TO THE OFFICE FOR PROCESSING.   °DO NOT GIVE THEM TO YOUR DOCTOR. ° °1. A prescription for pain medication may be given to you upon discharge.  Take your pain medication as prescribed, if needed.  If narcotic   pain medicine is not needed, then you may take acetaminophen (Tylenol) or ibuprofen (Advil) as needed. °2. Take your usually prescribed medications unless otherwise directed. °3. If you need a refill on your pain medication, please contact your pharmacy.  They will contact our office to request authorization. Prescriptions will not be filled after 5pm or on  week-ends. °4. You should follow a light diet the first few days after arrival home, such as soup and crackers, etc.  Be sure to include lots of fluids daily. °5. Most patients will experience some swelling and bruising in the area of the incisions.  Ice packs will help.  Swelling and bruising can take several days to resolve.  °6. It is common to experience some constipation if taking pain medication after surgery.  Increasing fluid intake and taking a stool softener (such as Colace) will usually help or prevent this problem from occurring.  A mild laxative (Milk of Magnesia or Miralax) should be taken according to package instructions if there are no bowel movements after 48 hours. °7. Unless discharge instructions indicate otherwise, you may remove your bandages 24-48 hours after surgery, and you may shower at that time.  You may have steri-strips (small skin tapes) in place directly over the incision.  These strips should be left on the skin for 7-10 days.  If your surgeon used skin glue on the incision, you may shower in 24 hours.  The glue will flake off over the next 2-3 weeks.  Any sutures or staples will be removed at the office during your follow-up visit. °8. ACTIVITIES:  You may resume regular (light) daily activities beginning the next day--such as daily self-care, walking, climbing stairs--gradually increasing activities as tolerated.  You may have sexual intercourse when it is comfortable.  Refrain from any heavy lifting or straining until approved by your doctor. °a. You may drive when you are no longer taking prescription pain medication, you can comfortably wear a seatbelt, and you can safely maneuver your car and apply brakes. °b. RETURN TO WORK:  __________________________________________________________ °9. You should see your doctor in the office for a follow-up appointment approximately 2-3 weeks after your surgery.  Make sure that you call for this appointment within a day or two after you  arrive home to insure a convenient appointment time. °10. OTHER INSTRUCTIONS: __________________________________________________________________________________________________________________________ __________________________________________________________________________________________________________________________ °WHEN TO CALL YOUR DOCTOR: °1. Fever over 101.0 °2. Inability to urinate °3. Continued bleeding from incision. °4. Increased pain, redness, or drainage from the incision. °5. Increasing abdominal pain ° °The clinic staff is available to answer your questions during regular business hours.  Please don’t hesitate to call and ask to speak to one of the nurses for clinical concerns.  If you have a medical emergency, go to the nearest emergency room or call 911.  A surgeon from [facility] is always on call at the hospital. °[address], [address], [place], [place]  [address] ? [address] [address], [place], [place]   [address] °[phone] ? [phone] ? FAX [phone] °Web site: [email or web address] ° °

## 2015-05-22 NOTE — Progress Notes (Signed)
Date: May 22, 2015 Chart reviewed for concurrent status and case management needs. Will continue to follow patient for changes and needs: Rhonda Davis, BSN, RN, CCM   [phone] 

## 2015-05-22 NOTE — Evaluation (Addendum)
Physical Therapy Evaluation Patient Details Name: Joseph Clay MRN: [id number] DOB: 07/08/1950 Today's Date: 05/22/2015   History of Present Illness  Cholithiasis, chronic cholecystitis, cystic duct comes off the right hepatic duct, no filling defect, nodule of right lobe of liver. S/P LAPAROSCOPIC CHOLECYSTECTOMY WITH INTRAOPERATIVE CHOLANGIOGRAM, BIOPSY RIGHT LOBE OF LIVER on 05/21/15  Clinical Impression  Patient motivated to ambulate with RW. Patient will benefit from PT to address problems listed in the note below to DC to home with caregivers.    Follow Up Recommendations Home health PT;Supervision/Assistance - 24 hour    Equipment Recommendations  None recommended by PT    Recommendations for Other Services OT consult     Precautions / Restrictions Precautions Precautions: Fall      Mobility  Bed Mobility Overal bed mobility: Needs Assistance Bed Mobility: Sit to Supine       Sit to supine: Min assist   General bed mobility comments: assist with legs onto bed  Transfers Overall transfer level: Needs assistance Equipment used: Rolling walker (2 wheeled) Transfers: Sit to/from Stand Sit to Stand: Min assist;From elevated surface;+2 safety/equipment         General transfer comment: assit to rise due to R UE is painful and decreased elbow extension power.  Ambulation/Gait Ambulation/Gait assistance: Min assist;+2 safety/equipment Ambulation Distance (Feet): 240 Feet Assistive device: Rolling walker (2 wheeled) Gait Pattern/deviations: Step-through pattern     General Gait Details: slow speed, gait is steady  Information systems manager Rankin (Stroke Patients Only)       Balance Overall balance assessment: Needs assistance         Standing balance support: Bilateral upper extremity supported;During functional activity Standing balance-Leahy Scale: Fair                               Pertinent  Vitals/Pain Pain Assessment: No/denies pain    Home Living Family/patient expects to be discharged to:: Private residence Living Arrangements: Alone Available Help at Discharge: Family Type of Home: Apartment Home Access: Level entry     Home Layout: Able to live on main level with bedroom/bathroom Home Equipment: Dan Humphreys - 2 wheels Additional Comments: reports that daughter to stay with patient    Prior Function Level of Independence: Independent               Hand Dominance        Extremity/Trunk Assessment   Upper Extremity Assessment: RUE deficits/detail RUE Deficits / Details: c/o pain when bears weight, noted to have edema about the  elbow     LUE Deficits / Details: wfl   Lower Extremity Assessment: Generalized weakness      Cervical / Trunk Assessment: Other exceptions  Communication   Communication: No difficulties  Cognition Arousal/Alertness: Awake/alert Behavior During Therapy: WFL for tasks assessed/performed Overall Cognitive Status: Within Functional Limits for tasks assessed                      General Comments      Exercises        Assessment/Plan    PT Assessment Patient needs continued PT services  PT Diagnosis Difficulty walking;Generalized weakness   PT Problem List Decreased range of motion;Decreased activity tolerance;Decreased mobility;Cardiopulmonary status limiting activity;Decreased knowledge of precautions;Decreased safety awareness;Decreased knowledge of use of DME  PT Treatment Interventions DME instruction;Gait training;Functional mobility  training;Therapeutic activities;Therapeutic exercise;Patient/family education   PT Goals (Current goals can be found in the Care Plan section) Acute Rehab PT Goals Patient Stated Goal: to go home PT Goal Formulation: With patient Time For Goal Achievement: 06/05/15 Potential to Achieve Goals: Good    Frequency Min 3X/week   Barriers to discharge        Co-evaluation                End of Session Equipment Utilized During Treatment: Gait belt Activity Tolerance: Patient tolerated treatment well Patient left: in bed;with call bell/phone within reach;with nursing/sitter in room Nurse Communication: Mobility status         Time: 1101-1118 PT Time Calculation (min) (ACUTE ONLY): 17 min   Charges:   PT Evaluation $PT Eval Low Complexity: 1 Procedure     PT G CodesRada Hay 05/22/2015, 11:59 AM Blanchard Kelch PT [phone]

## 2015-05-22 NOTE — Progress Notes (Signed)
Patient Demographics  Joseph Clay, is a 65 y.o. male, DOB - [birth year], XBJ:[id number]  Admit date - 05/15/2015   Admitting Physician Joseph Bow, DO  Outpatient Primary MD for the patient is Joseph Mountain, MD  LOS - 7   Chief Complaint  Patient presents with  . Abdominal Pain       Admission HPI/Brief narrative: 65 year old male with past medical history of hyperlipidemia, arthritis, chronic back pain, COPD, diabetes mellitus, hypertension, sleep apnea, presents with complaints of crampy abdominal pain, septic in ED, hypotensive with leukocytosis and fever 103.1, has elevated LFTs, CT abdomen pelvis significant for cholelithiasis, abdominal ultrasound significant for cholelithiasis as well, MRCP significant for 3 mm distal CBD stone, no intrahepatic or extrahepatic ductal dilatation, common duct measure up to 5 mm, ERCP with sphincterotomy/papillotomy x2, ERCP with balloon pull-through and attempt at removal of calculus/calculi , and ERCP with balloon dilation, surgery consulted for laparoscopic cholecystectomy, patient with elevated lipase post ERCP, patient underwent laparoscopic cholecystectomy on 05/21/2015. Subjective:   Joseph Clay today has, No headache, No chest pain,abd pain, with no change,  denies any cough or shortness of breath.   Assessment & Plan    Principal Problem:   SIRS (systemic inflammatory response syndrome) (HCC) Active Problems:   HTN (hypertension)   DM type 2 (diabetes mellitus, type 2) (HCC)   Abdominal pain   Lactic acidosis   Hyperbilirubinemia   Transaminitis  Sepsis secondary to cholangitis versus CBD stone , and Enterobacter Cloace bacteremia - presented with fever, tachycardia, hypotension, leukocytosis and elevated lactic acid - Treated with IV fluid resuscitation, most recent  lactic acid within normal limits - Treated with IV Zosyn, blood  culture growing enterobacter cloacae, sensitive to Zosyn and Rocephin been treated with Zosyn since admission, transitioned to Rocephin 05/19/2015,  stopped IV vancomycin 2/2, patient to be discharged on oral ciprofloxacin tomorrow to finish total of 14 days of antibiotic treatment therapy as discussed with Dr. Ilsa Clay. - Gastroenterology  greatly appreciated, source most likely to cholangitis versus CBD stone -  MRCP significant for 3 mm distal CBD stone, no intrahepatic or extrahepatic ductal dilatation, common duct measure up to 5 mm, ERCP with sphincterotomy/papillotomy x2, ERCP with balloon pull-through and attempt at removal of calculus/calculi , and ERCP with balloon dilation. - surgery consult appreciated,underwent  laparoscopic cholecystectomy today by Joseph Clay ,  no filling defects on cholangiogram .finding of nodule of right lobe of the liver , biopsy were taken .  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  Pancreatitis  - Lipase peaked at 3262, post ERCP pancreatitis,trending down   Elevated LFTs - improving   Hyperkalemia - Received D50, IV calcium gluconate, Kayexalate - Resolved  Diabetes mellitus - Hold oral hypoglycemic agent, continue with insulin sliding scale  Hyperlipidemia - Hold statin in the setting of elevated LFTs  Hypertension - Labile, initially hypotensive, home meds has been on hold ,  , increased home dose of amlodipine from 5-10 mg , better controlled after starting on by mouth hydralazine, as well on when necessary hydralazine .  protonged QTc - And with significant hypokalemia and hypomagnesemia, resolved on repeat EKG this a.m.  after repleting  Hypomagnesemia/hypokalemia - Repleted,   Prostate hypertrophy with nodular appearance on imaging. - PSA WNL  Code Status: Full  Family Communication: Daughter at bedside  Disposition Plan: We'll  transfer to medical floor today, DC home in a.m. if remains stable   Procedures  ERCP 2/2 LAPAROSCOPIC CHOLECYSTECTOMY WITH INTRAOPERATIVE CHOLANGIOGRAM, BIOPSY RIGHT LOBE OF LIVER 2/6 by Joseph Clay   Consults   Gastroenterology Gen. surgery  Medications  Scheduled Meds: . amLODipine  10 mg Oral Daily  . antiseptic oral rinse  7 mL Mouth Rinse q12n4p  . cefTRIAXone (ROCEPHIN)  IV  2 g Intravenous Q24H  . chlorhexidine  15 mL Mouth Rinse BID  . enoxaparin (LOVENOX) injection  65 mg Subcutaneous Q24H  . hydrALAZINE  50 mg Oral 4 times per day  . insulin aspart  0-9 Units Subcutaneous 6 times per day  . pantoprazole (PROTONIX) IV  40 mg Intravenous Q12H  . sodium chloride flush  3 mL Intravenous Q12H   Continuous Infusions:   PRN Meds:.acetaminophen **OR** acetaminophen, HYDROcodone-acetaminophen, metoprolol, morphine injection  DVT Prophylaxis  Lovenox   Lab Results  Component Value Date   PLT 247 05/22/2015    Antibiotics   Anti-infectives    Start     Dose/Rate Route Frequency Ordered Stop   05/19/15 1200  cefTRIAXone (ROCEPHIN) 2 g in dextrose 5 % 50 mL IVPB     2 g 100 mL/hr over 30 Minutes Intravenous Every 24 hours 05/19/15 1130     05/16/15 2000  vancomycin (VANCOCIN) 1,500 mg in sodium chloride 0.9 % 500 mL IVPB  Status:  Discontinued     1,500 mg 250 mL/hr over 120 Minutes Intravenous Every 24 hours 05/16/15 0935 05/17/15 0704   05/16/15 1000  vancomycin (VANCOCIN) 1,250 mg in sodium chloride 0.9 % 250 mL IVPB  Status:  Discontinued     1,250 mg 166.7 mL/hr over 90 Minutes Intravenous Every 12 hours 05/15/15 2102 05/16/15 0935   05/16/15 0600  piperacillin-tazobactam (ZOSYN) IVPB 3.375 g  Status:  Discontinued     3.375 g 12.5 mL/hr over 240 Minutes Intravenous Every 8 hours 05/15/15 2100 05/19/15 1130   05/15/15 2000  vancomycin (VANCOCIN) 2,000 mg in sodium chloride 0.9 % 500 mL IVPB     2,000 mg 250 mL/hr over 120 Minutes Intravenous  Once 05/15/15 1956  05/15/15 2300   05/15/15 2000  piperacillin-tazobactam (ZOSYN) IVPB 3.375 g     3.375 g 100 mL/hr over 30 Minutes Intravenous  Once 05/15/15 1956 05/16/15 0019          Objective:   Filed Vitals:   05/22/15 1100 05/22/15 1200 05/22/15 1300 05/22/15 1310  BP:  147/51    Pulse: 99 97 97 99  Temp:  98.2 F (36.8 C)    TempSrc:  Oral    Resp: 33 29 19 37  Height:      Weight:      SpO2: 97% 89% 88% 97%    Wt Readings from Last 3 Encounters:  05/21/15 132.5 kg (292 lb 1.8 oz)  05/16/15 131.997 kg (291 lb)  03/25/13 133.811 kg (295 lb)     Intake/Output Summary (Last 24 hours) at 05/22/15 1350 Last data filed at 05/22/15 1223  Gross per 24 hour  Intake   1250 ml  Output   1325 ml  Net    -75 ml     Physical Exam  Awake Alert, Oriented X 3, No new F.N deficits, Normal affect Cameron.AT,PERRAL Supple Neck,No  JVD,  Symmetrical Chest wall movement, Good air movement bilaterally, CTAB RRR,No Gallops,Rubs or new Murmurs, No Parasternal Heave +ve B.Sounds, Abd Soft, mild diffuse tenderness  , No rebound - guarding or rigidity. No Cyanosis, Clubbing, no  edema, No new Rash or bruise     Data Review   Micro Results Recent Results (from the past 240 hour(s))  Blood culture (routine x 2)     Status: None   Collection Time: 05/15/15  8:41 PM  Result Value Ref Range Status   Specimen Description BLOOD RIGHT HAND  Final   Special Requests BOTTLES DRAWN AEROBIC AND ANAEROBIC 5CC EACH  Final   Culture  Setup Time   Final    GRAM NEGATIVE RODS ANAEROBIC BOTTLE ONLY CRITICAL RESULT CALLED TO, READ BACK BY AND VERIFIED WITH: Claris Gladden RN 2011 05/16/15 A BROWNING    Culture   Final    ENTEROBACTER CLOACAE Performed at [facility]    Report Status 05/18/2015 FINAL  Final   Organism ID, Bacteria ENTEROBACTER CLOACAE  Final      Susceptibility   Enterobacter cloacae - MIC*    CEFAZOLIN >=64 RESISTANT Resistant     CEFEPIME <=1 SENSITIVE Sensitive     CEFTAZIDIME <=1  SENSITIVE Sensitive     CEFTRIAXONE <=1 SENSITIVE Sensitive     CIPROFLOXACIN <=0.25 SENSITIVE Sensitive     GENTAMICIN <=1 SENSITIVE Sensitive     IMIPENEM <=0.25 SENSITIVE Sensitive     TRIMETH/SULFA <=20 SENSITIVE Sensitive     PIP/TAZO <=4 SENSITIVE Sensitive     * ENTEROBACTER CLOACAE  MRSA PCR Screening     Status: None   Collection Time: 05/15/15 11:45 PM  Result Value Ref Range Status   MRSA by PCR NEGATIVE NEGATIVE Final    Comment:        The GeneXpert MRSA Assay (FDA approved for NASAL specimens only), is one component of a comprehensive MRSA colonization surveillance program. It is not intended to diagnose MRSA infection nor to guide or monitor treatment for MRSA infections.   Blood culture (routine x 2)     Status: None   Collection Time: 05/16/15  3:48 AM  Result Value Ref Range Status   Specimen Description BLOOD LEFT ARM  Final   Special Requests BOTTLES DRAWN AEROBIC AND ANAEROBIC 5CC  Final   Culture   Final    NO GROWTH 5 DAYS Performed at [facility]    Report Status 05/21/2015 FINAL  Final    Radiology Reports Dg Cholangiogram Operative  05/21/2015  CLINICAL DATA:  Status post cholecystectomy EXAM: INTRAOPERATIVE CHOLANGIOGRAM TECHNIQUE: Cholangiographic images from the C-arm fluoroscopic device were submitted for interpretation post-operatively. Please see the procedural report for the amount of contrast and the fluoroscopy time utilized. COMPARISON:  05/17/2015 FINDINGS: Injection in the cystic duct remnant reveals no filling defect and free flow contrast into the duodenum. There is a relatively low insertion of the right hepatic duct. IMPRESSION: No retained filling defects identified. Electronically Signed   By: Alcide Clever M.D.   On: 05/21/2015 11:20   Ct Abdomen Pelvis W Contrast  05/15/2015  CLINICAL DATA:  Mid abdominal pain and cramping. Shortness of breath. EXAM: CT ABDOMEN AND PELVIS WITH CONTRAST TECHNIQUE: Multidetector CT imaging of  the abdomen and pelvis was performed using the standard protocol following bolus administration of intravenous contrast. CONTRAST:  OMNIPAQUE IOHEXOL 300 MG/ML  SOLN COMPARISON:  05/31/2012 FINDINGS: Lower chest:  No acute findings. Hepatobiliary: 1.3 cm calcified gallstone without  significant gallbladder distention or wall thickening. Normal appearance of liver and the portal venous system is patent. Pancreas: Normal appearance of the pancreas without inflammation or duct dilatation. Spleen: Normal appearance of spleen without enlargement. Adrenals/Urinary Tract: Many of the images are limited by motion artifact. There is a stable exophytic 1.1 cm low-density structure in the lateral right kidney which probably represents a small exophytic cyst. Question another small cyst in the anterior right kidney. Normal appearance of the adrenal glands. No hydronephrosis or kidney stones. Normal appearance of the urinary bladder. Probable small cyst in the anterior left kidney that is too small to definitively characterize. Stomach/Bowel: Scattered colonic diverticular without acute inflammation. Mild dilatation of proximal jejunum. No evidence for obstruction. Vascular/Lymphatic: Again noted are prominent peri-aortic lymph nodes. Index lesion anterior to the aorta measures 1.3 x 1.1 cm on sequence 2, image 41 and previously measured 1.2 x 1.1 cm. The mild retroperitoneal lymphadenopathy has not significantly changed from the prior examination. Atherosclerotic calcifications in the abdominal aorta without aneurysm. Reproductive: Prostate is large for size measuring 5.5 x 6.5 cm on sequence 2, image 87. Prostate is nodular along the bladder base. Calcifications along the posterior aspect of the prostate. Other: No free fluid. Musculoskeletal: Multilevel degenerative facet disease in lumbar spine. No acute bone abnormality. IMPRESSION: No acute abnormality in the abdomen or pelvis. Cholelithiasis.  No evidence for  gallbladder inflammation. Prostate hypertrophy.  Recommend correlation with PSA level. Electronically Signed   By: Richarda Overlie M.D.   On: 05/15/2015 19:30   Dg Chest Port 1 View  05/19/2015  CLINICAL DATA:  Respiratory difficulty. EXAM: PORTABLE CHEST 1 VIEW COMPARISON:  05/15/2015 FINDINGS: Lung volume are low. Cardiomediastinal contours are unchanged. Probable vascular congestion. Linear atelectasis in the lower lobes, unchanged from prior exam. No new airspace disease. No large pleural effusion or pneumothorax. Osseous structures are unchanged. IMPRESSION: Mild vascular congestion.  Unchanged bibasilar atelectasis. Electronically Signed   By: Rubye Oaks M.D.   On: 05/19/2015 19:24   Dg Chest Port 1 View  05/15/2015  CLINICAL DATA:  Patient greater mid abdominal pain and cramping. Some shortness of breath. Former smoker. History of hypertension and diabetes. EXAM: PORTABLE CHEST 1 VIEW COMPARISON:  03/25/2013 FINDINGS: There is mild opacity at the lung bases most likely atelectasis. No evidence of pulmonary edema. No apparent pleural effusion and no evidence of a pneumothorax. Cardiac silhouette is normal in size. No mediastinal or hilar masses or evidence of adenopathy. Bony thorax is grossly intact. IMPRESSION: 1. No acute cardiopulmonary disease. 2. Mild lung base atelectasis. Electronically Signed   By: Amie Portland M.D.   On: 05/15/2015 21:14   Dg Ercp Biliary & Pancreatic Ducts  05/17/2015  CLINICAL DATA:  Cholelithiasis with possible choledocholithiasis. EXAM: ERCP TECHNIQUE: Multiple spot images obtained with the fluoroscopic device and submitted for interpretation post-procedure. COMPARISON:  MRCP study earlier today. FINDINGS: Imaging from an ERCP procedure with a C-arm demonstrates cannulation of the common bile duct. Contrast injection shows a normal caliber duct and visualized intrahepatic ducts. No filling defects are seen on the submitted imaging. No evidence of contrast extravasation.  IMPRESSION: Normal caliber bile ducts by ERCP with no filling defects identified. These images were submitted for radiologic interpretation only. Please see the procedural report for the amount of contrast and the fluoroscopy time utilized. Electronically Signed   By: Irish Lack M.D.   On: 05/17/2015 13:05   Mr Abd W/wo Cm/mrcp  05/17/2015  CLINICAL DATA:  Mid lower abdominal pain,  nausea, gallstone. EXAM: MRI ABDOMEN WITHOUT AND WITH CONTRAST (INCLUDING MRCP) TECHNIQUE: Multiplanar multisequence MR imaging of the abdomen was performed both before and after the administration of intravenous contrast. Heavily T2-weighted images of the biliary and pancreatic ducts were obtained, and three-dimensional MRCP images were rendered by post processing. CONTRAST:  20mL MULTIHANCE GADOBENATE DIMEGLUMINE 529 MG/ML IV SOLN COMPARISON:  Right upper quadrant ultrasound dated 05/15/2015. CT abdomen pelvis dated 05/15/2015. FINDINGS: Motion degraded images. Significant dielectric effect/Clay wave artifact in the central abdomen related to patient body habitus and imaging at 3T. Lower chest:  Lung bases are clear. Hepatobiliary: Mild hepatic steatosis. No suspicious/enhancing hepatic lesions. Layering 1.4 cm gallstone (series 15/ image 21), without associated gallbladder wall thickening or inflammatory changes. Common duct is obscured on many sequences but is well visualized on the axial T2 Fiesta sequence (series 13), were it is nondilated, measuring up to 5 mm. A tiny 3 mm distal CBD stone may be present (series 13/ image 31). Pancreas: Within normal limits. Spleen: Within normal limits. Adrenals/Urinary Tract: Bilateral adrenal glands are within normal limits. Kidneys are within normal limits.  No hydronephrosis. Stomach/Bowel: Stomach and visualized bowel are unremarkable. Vascular/Lymphatic: No evidence abdominal aortic aneurysm. No suspicious abdominal lymphadenopathy. Other: No abdominal ascites. Musculoskeletal: No  focal osseous lesions. IMPRESSION: Motion degraded images with significant artifact in the central abdomen, as above. Cholelithiasis, without evidence of acute cholecystitis. No intrahepatic or extrahepatic ductal dilatation. Common duct measures up to 5 mm. Possible 3 mm distal CBD stone, equivocal. Mild hepatic steatosis. Electronically Signed   By: Charline Bills M.D.   On: 05/17/2015 08:08   US Abdomen Limited Ruq  05/15/2015  CLINICAL DATA:  Abdominal pain, cramping. Elevated liver function tests. EXAM: US ABDOMEN LIMITED - RIGHT UPPER QUADRANT COMPARISON:  CT abdomen and pelvis May 15, 2015 at 1913 hours FINDINGS: Limited assessment due to large body habitus. Gallbladder: Echogenic gallstone with acoustic shadowing. Gallbladder wall is mildly thickened at 3-4 mm. Trace pericholecystic fluid. No sonographic Murphy's sign elicited. Common bile duct: Diameter: 3.4 mm. Liver: No focal lesion identified. Diffusely echogenic. Hepatopetal portal vein. IMPRESSION: Habitus limited examination. Cholelithiasis and sonographic findings of mild cholecystitis though, no sonographic Murphy's sign elicited. Hepatic steatosis. Electronically Signed   By: Awilda Metro M.D.   On: 05/15/2015 21:12     CBC  Recent Labs Lab 05/15/15 2043  05/18/15 0343 05/19/15 0341 05/20/15 0403 05/21/15 0708 05/22/15 0403  WBC 15.2*  < > 7.6 11.1* 12.0* 13.1* 9.8  HGB 13.0  < > 10.9* 12.4* 11.6* 12.3* 11.5*  HCT 40.1  < > 33.9* 37.3* 36.2* 39.3 36.8*  PLT 191  < > 146* 191 220 277 247  MCV 84.1  < > 84.3 82.9 84.6 85.1 86.4  MCH 27.3  < > 27.1 27.6 27.1 26.6 27.0  MCHC 32.4  < > 32.2 33.2 32.0 31.3 31.3  RDW 14.9  < > 15.3 15.4 16.0* 16.3* 16.2*  LYMPHSABS 0.6*  --  0.8 0.8 0.9  --   --   MONOABS 0.5  --  0.5 0.8 1.0  --   --   EOSABS 0.0  --  0.1 0.0 0.1  --   --   BASOSABS 0.0  --  0.0 0.0 0.0  --   --   < > = values in this interval not displayed.  Chemistries   Recent Labs Lab 05/18/15 0343  05/19/15 0341 05/19/15 2256 05/20/15 0403 05/21/15 0708 05/22/15 0403  NA 139 142  --  137 139 139  K 3.9 3.7 3.1* 3.3* 3.5 3.3*  CL 107 107  --  101 100* 99*  CO2 22 24  --  24 26 28   GLUCOSE 151* 161*  --  136* 145* 145*  BUN 12 9  --  10 13 15   CREATININE 0.99 0.80  --  0.99 0.99 1.03  CALCIUM 8.1* 8.8*  --  8.6* 8.8* 8.6*  MG  --   --  1.4*  --   --   --   AST 52* 64*  --  41 33 50*  ALT 121* 117*  --  90* 64* 70*  ALKPHOS 133* 179*  --  153* 144* 134*  BILITOT 6.4* 7.2*  --  4.9* 3.0* 2.0*   ------------------------------------------------------------------------------------------------------------------ estimated creatinine clearance is 102.1 mL/min (by C-G formula based on Cr of 1.03). ------------------------------------------------------------------------------------------------------------------ No results for input(s): HGBA1C in the last 72 hours. ------------------------------------------------------------------------------------------------------------------ No results for input(s): CHOL, HDL, LDLCALC, TRIG, CHOLHDL, LDLDIRECT in the last 72 hours. ------------------------------------------------------------------------------------------------------------------ No results for input(s): TSH, T4TOTAL, T3FREE, THYROIDAB in the last 72 hours.  Invalid input(s): FREET3 ------------------------------------------------------------------------------------------------------------------ No results for input(s): VITAMINB12, FOLATE, FERRITIN, TIBC, IRON, RETICCTPCT in the last 72 hours.  Coagulation profile  Recent Labs Lab 05/17/15 0920  INR 1.17    No results for input(s): DDIMER in the last 72 hours.  Cardiac Enzymes No results for input(s): CKMB, TROPONINI, MYOGLOBIN in the last 168 hours.  Invalid input(s): CK ------------------------------------------------------------------------------------------------------------------ Invalid input(s): POCBNP     Time  Spent in minutes   25 minutes   Soleia Badolato M.D on 05/22/2015 at 1:50 PM  Between 7am to 7pm - Pager - [phone]  After 7pm go to [email or web address] - password [email or web address]  [facility]   Office  [phone]

## 2015-05-22 NOTE — Progress Notes (Signed)
LFTs downtrending.  Intraoperative cholangiogram showed no bile duct filling defects.  Will sign-off; please call with questions; thank you for the consultation.

## 2015-05-22 NOTE — Plan of Care (Signed)
Problem: Activity: Goal: Risk for activity intolerance will decrease Outcome: Progressing Pt walked around the unit with physical therapy today.

## 2015-05-23 DIAGNOSIS — K8050 Calculus of bile duct without cholangitis or cholecystitis without obstruction: Secondary | ICD-10-CM

## 2015-05-23 DIAGNOSIS — R740 Nonspecific elevation of levels of transaminase and lactic acid dehydrogenase [LDH]: Secondary | ICD-10-CM

## 2015-05-23 DIAGNOSIS — I10 Essential (primary) hypertension: Secondary | ICD-10-CM

## 2015-05-23 DIAGNOSIS — K830 Cholangitis: Secondary | ICD-10-CM

## 2015-05-23 DIAGNOSIS — R7881 Bacteremia: Secondary | ICD-10-CM

## 2015-05-23 DIAGNOSIS — E109 Type 1 diabetes mellitus without complications: Secondary | ICD-10-CM

## 2015-05-23 DIAGNOSIS — K8309 Other cholangitis: Secondary | ICD-10-CM

## 2015-05-23 DIAGNOSIS — E872 Acidosis: Secondary | ICD-10-CM

## 2015-05-23 DIAGNOSIS — A419 Sepsis, unspecified organism: Principal | ICD-10-CM

## 2015-05-23 LAB — HEPATIC FUNCTION PANEL
ALT: 74 U/L — ABNORMAL HIGH (ref 17–63)
AST: 52 U/L — ABNORMAL HIGH (ref 15–41)
Albumin: 2.9 g/dL — ABNORMAL LOW (ref 3.5–5.0)
Alkaline Phosphatase: 133 U/L — ABNORMAL HIGH (ref 38–126)
BILIRUBIN INDIRECT: 0.9 mg/dL (ref 0.3–0.9)
Bilirubin, Direct: 0.8 mg/dL — ABNORMAL HIGH (ref 0.1–0.5)
Total Bilirubin: 1.7 mg/dL — ABNORMAL HIGH (ref 0.3–1.2)
Total Protein: 7.1 g/dL (ref 6.5–8.1)

## 2015-05-23 LAB — BASIC METABOLIC PANEL
ANION GAP: 11 (ref 5–15)
BUN: 15 mg/dL (ref 6–20)
CO2: 29 mmol/L (ref 22–32)
Calcium: 9 mg/dL (ref 8.9–10.3)
Chloride: 96 mmol/L — ABNORMAL LOW (ref 101–111)
Creatinine, Ser: 1 mg/dL (ref 0.61–1.24)
GFR calc Af Amer: >60 mL/min (ref >60)
GFR calc non Af Amer: >60 mL/min (ref >60)
GLUCOSE: 223 mg/dL — AB (ref 65–99)
Potassium: 3.4 mmol/L — ABNORMAL LOW (ref 3.5–5.1)
Sodium: 136 mmol/L (ref 135–145)

## 2015-05-23 LAB — CBC
HEMATOCRIT: 38.6 % — AB (ref 39.0–52.0)
Hemoglobin: 11.9 g/dL — ABNORMAL LOW (ref 13.0–17.0)
MCH: 26.6 pg (ref 26.0–34.0)
MCHC: 30.8 g/dL (ref 30.0–36.0)
MCV: 86.2 fL (ref 78.0–100.0)
PLATELETS: 278 10*3/uL (ref 150–400)
RBC: 4.48 MIL/uL (ref 4.22–5.81)
RDW: 16 % — AB (ref 11.5–15.5)
WBC: 11.2 10*3/uL — AB (ref 4.0–10.5)

## 2015-05-23 LAB — GLUCOSE, CAPILLARY
GLUCOSE-CAPILLARY: 202 mg/dL — AB (ref 65–99)
Glucose-Capillary: 198 mg/dL — ABNORMAL HIGH (ref 65–99)

## 2015-05-23 LAB — LIPASE, BLOOD: Lipase: 107 U/L — ABNORMAL HIGH (ref 11–51)

## 2015-05-23 MED ORDER — CIPROFLOXACIN HCL 500 MG PO TABS: 500.0000 mg | ORAL_TABLET | Freq: Two times a day (BID) | ORAL | Status: DC

## 2015-05-23 NOTE — Discharge Summary (Signed)
Physician Discharge Summary  Joseph Clay ZOX:[id number] DOB: [birth year] DOA: 05/15/2015  PCP: [name], MD  Admit date: 05/15/2015 Discharge date: 05/23/2015  Time spent: 50 minutes  Recommendations for Outpatient Follow-up:  1. F/u with Gen surgery in 2 wks   Discharge Condition: stable    Discharge Diagnoses:  Principal Problem:   Sepsis (HCC) Active Problems:   Transaminitis   Bacteremia   Cholangiolitis   Choledocholithiasis   HTN (hypertension)   DM type 2 (diabetes mellitus, type 2) (HCC)   Lactic acidosis   Hyperbilirubinemia   History of present illness:  65 year old male with past medical history of hyperlipidemia, arthritis, chronic back pain, COPD, diabetes mellitus, hypertension, sleep apnea, presents with complaints of crampy abdominal pain. He was found to be septic with hypotension, leukocytosis and fever 103.1 and noted to have elevated LFTs.  CT abdomen pelvis significant for cholelithiasis, abdominal ultrasound significant for cholelithiasis. MRCP significant for 3 mm distal CBD stone- no intrahepatic or extrahepatic ductal dilatation, common duct measure up to 5 mm.    Hospital Course:  Sepsis secondary to Cholangitis/choledocholithiasis and enterobacter bacteremia - underwent ERCP on 2/2 and lap cholecystectomy on 2/6 - one set of blood cultures positive ofr enterobacter Cloacae on 2/3- will need 2 wks of antibiotics- he has received 9 days in the hospital - will switch to Cipro and complete 5 more days  Elevated LFTs -Echo narrative above and improving  Acute pancreatitis - Lipase was noted to be elevated at 3362 on 05/18/15  -likely secondary to above choledocholithiasis and now resolved   Hyperkalemia on admission - resolved- now slightly hypokalemic- given replacement today   Incidental finding of nodule of right lobe of liver during surgery  - 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 (VON [place] COMPLEX)  Diabetes  mellitus -can resume metformin  HTN - resume Lisinopril, Lasix and Amlodipine  Procedures:  ERCP  Lap cholecystectomy  Consultations:  GI  Gen surgery  Discharge Exam: Filed Weights   05/15/15 2238 05/21/15 0500 05/22/15 1712  Weight: 132.2 kg (291 lb 7.2 oz) 132.5 kg (292 lb 1.8 oz) 131.543 kg (290 lb)   Filed Vitals:   05/22/15 2323 05/23/15 0444  BP:  151/66  Pulse:  93  Temp:  98 F (36.7 C)  Resp: 20 24    General: AAO x 3, no distress Cardiovascular: RRR, no murmurs  Respiratory: clear to auscultation bilaterally GI: soft, non-tender, moderately distended, bowel sound positive  Discharge Instructions You were cared for by a hospitalist during your hospital stay. If you have any questions about your discharge medications or the care you received while you were in the hospital after you are discharged, you can call the unit and asked to speak with the hospitalist on call if the hospitalist that took care of you is not available. Once you are discharged, your primary care physician will handle any further medical issues. Please note that NO REFILLS for any discharge medications will be authorized once you are discharged, as it is imperative that you return to your primary care physician (or establish a relationship with a primary care physician if you do not have one) for your aftercare needs so that they can reassess your need for medications and monitor your lab values.      Discharge Instructions    Call MD for:  persistant nausea and vomiting    Complete by:  As directed      Call MD for:  temperature >100.4  Complete by:  As directed      Diet - low sodium heart healthy    Complete by:  As directed      Discharge instructions    Complete by:  As directed   Low fat- low sodium- low carb diet     Increase activity slowly    Complete by:  As directed             Medication List    TAKE these medications        amLODipine 5 MG tablet  Commonly  known as:  NORVASC  Take 5 mg by mouth daily.     calcium carbonate 600 MG Tabs tablet  Commonly known as:  OS-CAL  Take 600 mg by mouth every evening.     ciprofloxacin 500 MG tablet  Commonly known as:  CIPRO  Take 1 tablet (500 mg total) by mouth 2 (two) times daily.     docusate sodium 50 MG capsule  Commonly known as:  COLACE  Take 50 mg by mouth 2 (two) times daily.     furosemide 40 MG tablet  Commonly known as:  LASIX  Take 40 mg by mouth daily.     lisinopril 20 MG tablet  Commonly known as:  PRINIVIL,ZESTRIL  Take 20 mg by mouth daily.     metFORMIN 1000 MG tablet  Commonly known as:  GLUCOPHAGE  Take 1,000 mg by mouth 2 (two) times daily.     potassium chloride SA 20 MEQ tablet  Commonly known as:  K-DUR,KLOR-CON  Take 20 mEq by mouth daily.     pravastatin 40 MG tablet  Commonly known as:  PRAVACHOL  Take 20 mg by mouth every evening.       No Active Allergies Follow-up Information    Follow up with [facility], MD.   Specialty:  General Surgery   Why:  Call and make an appointment in 2 weeks   Contact information:   [address] [place] [address] [phone]       Follow up with [name], MD In 1 week.   Specialty:  Internal Medicine   Contact information:   [address] [place] [address] [phone]        The results of significant diagnostics from this hospitalization (including imaging, microbiology, ancillary and laboratory) are listed below for reference.    Significant Diagnostic Studies: Dg Cholangiogram Operative  05/21/2015  CLINICAL DATA:  Status post cholecystectomy EXAM: INTRAOPERATIVE CHOLANGIOGRAM TECHNIQUE: Cholangiographic images from the C-arm fluoroscopic device were submitted for interpretation post-operatively. Please see the procedural report for the amount of contrast and the fluoroscopy time utilized. COMPARISON:  05/17/2015 FINDINGS: Injection in the cystic duct  remnant reveals no filling defect and free flow contrast into the duodenum. There is a relatively low insertion of the right hepatic duct. IMPRESSION: No retained filling defects identified. Electronically Signed   By: Alcide Clever M.D.   On: 05/21/2015 11:20   Ct Abdomen Pelvis W Contrast  05/15/2015  CLINICAL DATA:  Mid abdominal pain and cramping. Shortness of breath. EXAM: CT ABDOMEN AND PELVIS WITH CONTRAST TECHNIQUE: Multidetector CT imaging of the abdomen and pelvis was performed using the standard protocol following bolus administration of intravenous contrast. CONTRAST:  OMNIPAQUE IOHEXOL 300 MG/ML  SOLN COMPARISON:  05/31/2012 FINDINGS: Lower chest:  No acute findings. Hepatobiliary: 1.3 cm calcified gallstone without significant gallbladder distention or wall thickening. Normal appearance of liver and the  portal venous system is patent. Pancreas: Normal appearance of the pancreas without inflammation or duct dilatation. Spleen: Normal appearance of spleen without enlargement. Adrenals/Urinary Tract: Many of the images are limited by motion artifact. There is a stable exophytic 1.1 cm low-density structure in the lateral right kidney which probably represents a small exophytic cyst. Question another small cyst in the anterior right kidney. Normal appearance of the adrenal glands. No hydronephrosis or kidney stones. Normal appearance of the urinary bladder. Probable small cyst in the anterior left kidney that is too small to definitively characterize. Stomach/Bowel: Scattered colonic diverticular without acute inflammation. Mild dilatation of proximal jejunum. No evidence for obstruction. Vascular/Lymphatic: Again noted are prominent peri-aortic lymph nodes. Index lesion anterior to the aorta measures 1.3 x 1.1 cm on sequence 2, image 41 and previously measured 1.2 x 1.1 cm. The mild retroperitoneal lymphadenopathy has not significantly changed from the prior examination. Atherosclerotic  calcifications in the abdominal aorta without aneurysm. Reproductive: Prostate is large for size measuring 5.5 x 6.5 cm on sequence 2, image 87. Prostate is nodular along the bladder base. Calcifications along the posterior aspect of the prostate. Other: No free fluid. Musculoskeletal: Multilevel degenerative facet disease in lumbar spine. No acute bone abnormality. IMPRESSION: No acute abnormality in the abdomen or pelvis. Cholelithiasis.  No evidence for gallbladder inflammation. Prostate hypertrophy.  Recommend correlation with PSA level. Electronically Signed   By: Richarda Overlie M.D.   On: 05/15/2015 19:30   Dg Chest Port 1 View  05/19/2015  CLINICAL DATA:  Respiratory difficulty. EXAM: PORTABLE CHEST 1 VIEW COMPARISON:  05/15/2015 FINDINGS: Lung volume are low. Cardiomediastinal contours are unchanged. Probable vascular congestion. Linear atelectasis in the lower lobes, unchanged from prior exam. No new airspace disease. No large pleural effusion or pneumothorax. Osseous structures are unchanged. IMPRESSION: Mild vascular congestion.  Unchanged bibasilar atelectasis. Electronically Signed   By: Rubye Oaks M.D.   On: 05/19/2015 19:24   Dg Chest Port 1 View  05/15/2015  CLINICAL DATA:  Patient greater mid abdominal pain and cramping. Some shortness of breath. Former smoker. History of hypertension and diabetes. EXAM: PORTABLE CHEST 1 VIEW COMPARISON:  03/25/2013 FINDINGS: There is mild opacity at the lung bases most likely atelectasis. No evidence of pulmonary edema. No apparent pleural effusion and no evidence of a pneumothorax. Cardiac silhouette is normal in size. No mediastinal or hilar masses or evidence of adenopathy. Bony thorax is grossly intact. IMPRESSION: 1. No acute cardiopulmonary disease. 2. Mild lung base atelectasis. Electronically Signed   By: Amie Portland M.D.   On: 05/15/2015 21:14   Dg Ercp Biliary & Pancreatic Ducts  05/17/2015  CLINICAL DATA:  Cholelithiasis with possible  choledocholithiasis. EXAM: ERCP TECHNIQUE: Multiple spot images obtained with the fluoroscopic device and submitted for interpretation post-procedure. COMPARISON:  MRCP study earlier today. FINDINGS: Imaging from an ERCP procedure with a C-arm demonstrates cannulation of the common bile duct. Contrast injection shows a normal caliber duct and visualized intrahepatic ducts. No filling defects are seen on the submitted imaging. No evidence of contrast extravasation. IMPRESSION: Normal caliber bile ducts by ERCP with no filling defects identified. These images were submitted for radiologic interpretation only. Please see the procedural report for the amount of contrast and the fluoroscopy time utilized. Electronically Signed   By: Irish Lack M.D.   On: 05/17/2015 13:05   Mr Abd W/wo Cm/mrcp  05/17/2015  CLINICAL DATA:  Mid lower abdominal pain, nausea, gallstone. EXAM: MRI ABDOMEN WITHOUT AND WITH CONTRAST (INCLUDING MRCP) TECHNIQUE:  Multiplanar multisequence MR imaging of the abdomen was performed both before and after the administration of intravenous contrast. Heavily T2-weighted images of the biliary and pancreatic ducts were obtained, and three-dimensional MRCP images were rendered by post processing. CONTRAST:  20mL MULTIHANCE GADOBENATE DIMEGLUMINE 529 MG/ML IV SOLN COMPARISON:  Right upper quadrant ultrasound dated 05/15/2015. CT abdomen pelvis dated 05/15/2015. FINDINGS: Motion degraded images. Significant dielectric effect/standing wave artifact in the central abdomen related to patient body habitus and imaging at 3T. Lower chest:  Lung bases are clear. Hepatobiliary: Mild hepatic steatosis. No suspicious/enhancing hepatic lesions. Layering 1.4 cm gallstone (series 15/ image 21), without associated gallbladder wall thickening or inflammatory changes. Common duct is obscured on many sequences but is well visualized on the axial T2 Fiesta sequence (series 13), were it is nondilated, measuring up to 5 mm.  A tiny 3 mm distal CBD stone may be present (series 13/ image 31). Pancreas: Within normal limits. Spleen: Within normal limits. Adrenals/Urinary Tract: Bilateral adrenal glands are within normal limits. Kidneys are within normal limits.  No hydronephrosis. Stomach/Bowel: Stomach and visualized bowel are unremarkable. Vascular/Lymphatic: No evidence abdominal aortic aneurysm. No suspicious abdominal lymphadenopathy. Other: No abdominal ascites. Musculoskeletal: No focal osseous lesions. IMPRESSION: Motion degraded images with significant artifact in the central abdomen, as above. Cholelithiasis, without evidence of acute cholecystitis. No intrahepatic or extrahepatic ductal dilatation. Common duct measures up to 5 mm. Possible 3 mm distal CBD stone, equivocal. Mild hepatic steatosis. Electronically Signed   By: Charline Bills M.D.   On: 05/17/2015 08:08   US Abdomen Limited Ruq  05/15/2015  CLINICAL DATA:  Abdominal pain, cramping. Elevated liver function tests. EXAM: US ABDOMEN LIMITED - RIGHT UPPER QUADRANT COMPARISON:  CT abdomen and pelvis May 15, 2015 at 1913 hours FINDINGS: Limited assessment due to large body habitus. Gallbladder: Echogenic gallstone with acoustic shadowing. Gallbladder wall is mildly thickened at 3-4 mm. Trace pericholecystic fluid. No sonographic Murphy's sign elicited. Common bile duct: Diameter: 3.4 mm. Liver: No focal lesion identified. Diffusely echogenic. Hepatopetal portal vein. IMPRESSION: Habitus limited examination. Cholelithiasis and sonographic findings of mild cholecystitis though, no sonographic Murphy's sign elicited. Hepatic steatosis. Electronically Signed   By: Awilda Metro M.D.   On: 05/15/2015 21:12    Microbiology: Recent Results (from the past 240 hour(s))  Blood culture (routine x 2)     Status: None   Collection Time: 05/15/15  8:41 PM  Result Value Ref Range Status   Specimen Description BLOOD RIGHT HAND  Final   Special Requests BOTTLES DRAWN  AEROBIC AND ANAEROBIC 5CC EACH  Final   Culture  Setup Time   Final    GRAM NEGATIVE RODS ANAEROBIC BOTTLE ONLY CRITICAL RESULT CALLED TO, READ BACK BY AND VERIFIED WITH: Claris Gladden RN 2011 05/16/15 A BROWNING    Culture   Final    ENTEROBACTER CLOACAE Performed at [facility]    Report Status 05/18/2015 FINAL  Final   Organism ID, Bacteria ENTEROBACTER CLOACAE  Final      Susceptibility   Enterobacter cloacae - MIC*    CEFAZOLIN >=64 RESISTANT Resistant     CEFEPIME <=1 SENSITIVE Sensitive     CEFTAZIDIME <=1 SENSITIVE Sensitive     CEFTRIAXONE <=1 SENSITIVE Sensitive     CIPROFLOXACIN <=0.25 SENSITIVE Sensitive     GENTAMICIN <=1 SENSITIVE Sensitive     IMIPENEM <=0.25 SENSITIVE Sensitive     TRIMETH/SULFA <=20 SENSITIVE Sensitive     PIP/TAZO <=4 SENSITIVE Sensitive     *  ENTEROBACTER CLOACAE  MRSA PCR Screening     Status: None   Collection Time: 05/15/15 11:45 PM  Result Value Ref Range Status   MRSA by PCR NEGATIVE NEGATIVE Final    Comment:        The GeneXpert MRSA Assay (FDA approved for NASAL specimens only), is one component of a comprehensive MRSA colonization surveillance program. It is not intended to diagnose MRSA infection nor to guide or monitor treatment for MRSA infections.   Blood culture (routine x 2)     Status: None   Collection Time: 05/16/15  3:48 AM  Result Value Ref Range Status   Specimen Description BLOOD LEFT ARM  Final   Special Requests BOTTLES DRAWN AEROBIC AND ANAEROBIC 5CC  Final   Culture   Final    NO GROWTH 5 DAYS Performed at [facility]    Report Status 05/21/2015 FINAL  Final     Labs: Basic Metabolic Panel:  Recent Labs Lab 05/19/15 0341 05/19/15 2256 05/20/15 0403 05/21/15 0708 05/22/15 0403 05/23/15 0430  NA 142  --  137 139 139 136  K 3.7 3.1* 3.3* 3.5 3.3* 3.4*  CL 107  --  101 100* 99* 96*  CO2 24  --  24 26 28 29   GLUCOSE 161*  --  136* 145* 145* 223*  BUN 9  --  10 13 15 15   CREATININE 0.80   --  0.99 0.99 1.03 1.00  CALCIUM 8.8*  --  8.6* 8.8* 8.6* 9.0  MG  --  1.4*  --   --   --   --   PHOS  --   --   --   --  3.4  --    Liver Function Tests:  Recent Labs Lab 05/19/15 0341 05/20/15 0403 05/21/15 0708 05/22/15 0403 05/23/15 0430  AST 64* 41 33 50* 52*  ALT 117* 90* 64* 70* 74*  ALKPHOS 179* 153* 144* 134* 133*  BILITOT 7.2* 4.9* 3.0* 2.0* 1.7*  PROT 7.1 6.5 6.8 6.4* 7.1  ALBUMIN 3.2* 2.7* 2.8* 2.6* 2.9*    Recent Labs Lab 05/19/15 0341 05/20/15 0403 05/21/15 0708 05/22/15 0403 05/23/15 0430  LIPASE 163* 81* 115* 126* 107*   No results for input(s): AMMONIA in the last 168 hours. CBC:  Recent Labs Lab 05/18/15 0343 05/19/15 0341 05/20/15 0403 05/21/15 0708 05/22/15 0403 05/23/15 0430  WBC 7.6 11.1* 12.0* 13.1* 9.8 11.2*  NEUTROABS 6.3 9.4* 10.0*  --   --   --   HGB 10.9* 12.4* 11.6* 12.3* 11.5* 11.9*  HCT 33.9* 37.3* 36.2* 39.3 36.8* 38.6*  MCV 84.3 82.9 84.6 85.1 86.4 86.2  PLT 146* 191 220 277 247 278   Cardiac Enzymes: No results for input(s): CKTOTAL, CKMB, CKMBINDEX, TROPONINI in the last 168 hours. BNP: BNP (last 3 results) No results for input(s): BNP in the last 8760 hours.  ProBNP (last 3 results) No results for input(s): PROBNP in the last 8760 hours.  CBG:  Recent Labs Lab 05/22/15 1135 05/22/15 1607 05/22/15 2052 05/23/15 0851 05/23/15 1201  GLUCAP 188* 214* 219* 202* 198*       SignedCalvert Cantor, MD [facility] 05/23/2015, 4:09 PM

## 2015-05-23 NOTE — Progress Notes (Signed)
2 Days Post-Op  Subjective: Still on O2, not walking allot at this point, on a diabetic diet without issue.  Sites all look good.    Objective: Vital signs in last 24 hours: Temp:  [97.8 F (36.6 C)-99.6 F (37.6 C)] 98 F (36.7 C) (02/08 0444) Pulse Rate:  [93-104] 93 (02/08 0444) Resp:  [19-41] 24 (02/08 0444) BP: (126-151)/(54-66) 151/66 mmHg (02/08 0444) SpO2:  [88 %-97 %] 94 % (02/08 0444) Weight:  [131.543 kg (290 lb)] 131.543 kg (290 lb) (02/07 1712) Last BM Date: 05/17/15 1724 PO 3150 urine No BM yesterday Afebrile, VSS LFT's improvingWBC 11.2 Intake/Output from previous day: 02/07 0701 - 02/08 0700 In: 1988 [P.O.:1724; IV Piggyback:50] Out: 3150 [Urine:3150] Intake/Output this shift:    General appearance: alert, cooperative and no distress GI: soft, says he has allot of gas, mildly distended, but large abdomen. + BS.  Lab Results:   Recent Labs  05/22/15 0403 05/23/15 0430  WBC 9.8 11.2*  HGB 11.5* 11.9*  HCT 36.8* 38.6*  PLT 247 278    BMET  Recent Labs  05/22/15 0403 05/23/15 0430  NA 139 136  K 3.3* 3.4*  CL 99* 96*  CO2 28 29  GLUCOSE 145* 223*  BUN 15 15  CREATININE 1.03 1.00  CALCIUM 8.6* 9.0   PT/INR No results for input(s): LABPROT, INR in the last 72 hours.   Recent Labs Lab 05/19/15 0341 05/20/15 0403 05/21/15 0708 05/22/15 0403 05/23/15 0430  AST 64* 41 33 50* 52*  ALT 117* 90* 64* 70* 74*  ALKPHOS 179* 153* 144* 134* 133*  BILITOT 7.2* 4.9* 3.0* 2.0* 1.7*  PROT 7.1 6.5 6.8 6.4* 7.1  ALBUMIN 3.2* 2.7* 2.8* 2.6* 2.9*     Lipase     Component Value Date/Time   LIPASE 107* 05/23/2015 0430     Studies/Results: No results found.  Medications: . amLODipine  10 mg Oral Daily  . antiseptic oral rinse  7 mL Mouth Rinse q12n4p  . cefTRIAXone (ROCEPHIN)  IV  2 g Intravenous Q24H  . chlorhexidine  15 mL Mouth Rinse BID  . enoxaparin (LOVENOX) injection  65 mg Subcutaneous Q24H  . furosemide  40 mg Oral Daily  .  hydrALAZINE  50 mg Oral 4 times per day  . insulin aspart  0-15 Units Subcutaneous TID WC  . pantoprazole (PROTONIX) IV  40 mg Intravenous Q12H  . sodium chloride flush  3 mL Intravenous Q12H   Pathology: 1. Gallbladder - CHRONIC CHOLECYSTITIS WITH CHOLELITHIASIS. 2. Liver, biopsy, right lobe - BILE DUCT HAMARTOMA (VON [place] COMPLEX). - NO MALIGNANCY.  Assessment/Plan Symptomatic gallstone S/p laparoscopic cholecystectomy with IOC 05/21/15, Dr. Ezzard Standing S/p ERCP 05/17/15 Dr. Ewing Schlein with Biliary pancreatitis Sepsis/SIRS AODM Hypertension OSA on CPAP BPH Antibiotics:  4 days of Zosyn completed 05/18/15, day 5 ceftriaxone DVT:  Lovenox/SCD   Plan:  Doing well from our standpoint.  He can follow up in the office.       LOS: 8 days    JENNINGS,WILLARD 05/23/2015  Agree with above. Has done well with lap chole.  Ovidio Kin, MD, [facility] Surgery Pager: [phone] Office phone:  [phone]

## 2015-05-23 NOTE — Care Management Note (Signed)
Case Management Note  Patient Details  Name: Joseph Clay MRN: [id number] Date of Birth: 30-Dec-1950  Subjective/Objective:        65 yo admitted with SIRS            Action/Plan: From home alone  Expected Discharge Date:                  Expected Discharge Plan:  Home w Home Health Services  In-House Referral:     Discharge planning Services  CM Consult  Post Acute Care Choice:  Home Health Choice offered to:  Patient  DME Arranged:  3-N-1 DME Agency:  [facility]  HH Arranged:  PT [facility] Agency:     Status of Service:  Completed, signed off  Medicare Important Message Given:  Yes Date Medicare IM Given:    Medicare IM give by:    Date Additional Medicare IM Given:    Additional Medicare Important Message give by:     If discussed at Long Length of Stay Meetings, dates discussed:    Additional Comments: PT recommendations discussed with pt. Pt offered choice for HHPT and chose [facility]. [facility] rep contacted with referral.  Pt also requesting 3 in 1 for home.  3 in 1 ordered and [facility] DME rep contacted.  No other CM needs communicated. Bartholome Bill, RN 05/23/2015, 12:41 PM

## 2015-05-23 NOTE — Care Management Important Message (Signed)
Important Message  Patient Details  Name: QUASHON JESUS MRN: [id number] Date of Birth: [birth year]   Medicare Important Message Given:  Yes    Haskell Flirt 05/23/2015, 10:55 AMImportant Message  Patient Details  Name: LAMBERT JEANTY MRN: [id number] Date of Birth: 20-Jun-1950   Medicare Important Message Given:  Yes    Haskell Flirt 05/23/2015, 10:55 AM

## 2015-05-25 DIAGNOSIS — N189 Chronic kidney disease, unspecified: Secondary | ICD-10-CM | POA: Diagnosis not present

## 2015-05-25 DIAGNOSIS — G4733 Obstructive sleep apnea (adult) (pediatric): Secondary | ICD-10-CM | POA: Diagnosis not present

## 2015-05-25 DIAGNOSIS — I129 Hypertensive chronic kidney disease with stage 1 through stage 4 chronic kidney disease, or unspecified chronic kidney disease: Secondary | ICD-10-CM | POA: Diagnosis not present

## 2015-05-25 DIAGNOSIS — Z7982 Long term (current) use of aspirin: Secondary | ICD-10-CM | POA: Diagnosis not present

## 2015-05-25 DIAGNOSIS — E785 Hyperlipidemia, unspecified: Secondary | ICD-10-CM | POA: Diagnosis not present

## 2015-05-25 DIAGNOSIS — Z7984 Long term (current) use of oral hypoglycemic drugs: Secondary | ICD-10-CM | POA: Diagnosis not present

## 2015-05-25 DIAGNOSIS — M150 Primary generalized (osteo)arthritis: Secondary | ICD-10-CM | POA: Diagnosis not present

## 2015-05-25 DIAGNOSIS — E119 Type 2 diabetes mellitus without complications: Secondary | ICD-10-CM | POA: Diagnosis not present

## 2015-05-25 DIAGNOSIS — Z48815 Encounter for surgical aftercare following surgery on the digestive system: Secondary | ICD-10-CM | POA: Diagnosis not present

## 2015-05-30 DIAGNOSIS — E119 Type 2 diabetes mellitus without complications: Secondary | ICD-10-CM | POA: Diagnosis not present

## 2015-05-30 DIAGNOSIS — K830 Cholangitis: Secondary | ICD-10-CM | POA: Diagnosis not present

## 2015-05-30 DIAGNOSIS — K8020 Calculus of gallbladder without cholecystitis without obstruction: Secondary | ICD-10-CM | POA: Diagnosis not present

## 2015-05-30 DIAGNOSIS — R609 Edema, unspecified: Secondary | ICD-10-CM | POA: Diagnosis not present

## 2015-05-31 DIAGNOSIS — E119 Type 2 diabetes mellitus without complications: Secondary | ICD-10-CM | POA: Diagnosis not present

## 2015-06-05 ENCOUNTER — Encounter: Payer: Medicare Other | Attending: Internal Medicine

## 2015-06-05 VITALS — Ht 74.0 in | Wt 294.6 lb

## 2015-06-05 DIAGNOSIS — E119 Type 2 diabetes mellitus without complications: Secondary | ICD-10-CM | POA: Insufficient documentation

## 2015-06-09 NOTE — Progress Notes (Signed)

## 2015-06-12 DIAGNOSIS — E119 Type 2 diabetes mellitus without complications: Secondary | ICD-10-CM | POA: Diagnosis not present

## 2015-06-12 NOTE — Progress Notes (Signed)

## 2015-06-19 ENCOUNTER — Encounter: Payer: Medicare Other | Attending: Internal Medicine

## 2015-06-19 DIAGNOSIS — E119 Type 2 diabetes mellitus without complications: Secondary | ICD-10-CM | POA: Diagnosis not present

## 2015-06-19 NOTE — Progress Notes (Signed)
Patient was seen on 06/19/15 for the third of a series of three diabetes self-management courses at [facility].   . State the amount of activity recommended for healthy living . Describe activities suitable for individual needs . Identify ways to regularly incorporate activity into daily life . Identify barriers to activity and ways to over come these barriers  Identify diabetes medications being personally used and their primary action for lowering glucose and possible side effects . Describe role of stress on blood glucose and develop strategies to address psychosocial issues . Identify diabetes complications and ways to prevent them  Explain how to manage diabetes during illness . Evaluate success in meeting personal goal . Establish 2-3 goals that they will plan to diligently work on until they return for the  4-month follow-up visit  Goals:   I will count my carb choices at most meals and snacks  I will be active weekly  I will take my diabetes medications as scheduled  I will eat less unhealthy fats by eating less   I will test my glucose at least  3 days a week  To help manage stress I will  Go to gym at least 2 times a week  Your patient has identified these potential barriers to change:  None noted  Your patient has identified their diabetes self-care support plan as  Family Support    

## 2015-07-11 DIAGNOSIS — G4733 Obstructive sleep apnea (adult) (pediatric): Secondary | ICD-10-CM | POA: Diagnosis not present

## 2015-08-06 DIAGNOSIS — Z7984 Long term (current) use of oral hypoglycemic drugs: Secondary | ICD-10-CM | POA: Diagnosis not present

## 2015-08-06 DIAGNOSIS — I10 Essential (primary) hypertension: Secondary | ICD-10-CM | POA: Diagnosis not present

## 2015-08-06 DIAGNOSIS — E119 Type 2 diabetes mellitus without complications: Secondary | ICD-10-CM | POA: Diagnosis not present

## 2015-08-09 DIAGNOSIS — G4733 Obstructive sleep apnea (adult) (pediatric): Secondary | ICD-10-CM | POA: Diagnosis not present

## 2015-08-23 DIAGNOSIS — H524 Presbyopia: Secondary | ICD-10-CM | POA: Diagnosis not present

## 2015-10-25 DIAGNOSIS — G4733 Obstructive sleep apnea (adult) (pediatric): Secondary | ICD-10-CM | POA: Diagnosis not present

## 2015-11-26 DIAGNOSIS — G4733 Obstructive sleep apnea (adult) (pediatric): Secondary | ICD-10-CM | POA: Diagnosis not present

## 2015-12-10 DIAGNOSIS — I10 Essential (primary) hypertension: Secondary | ICD-10-CM | POA: Diagnosis not present

## 2015-12-10 DIAGNOSIS — Z7984 Long term (current) use of oral hypoglycemic drugs: Secondary | ICD-10-CM | POA: Diagnosis not present

## 2015-12-10 DIAGNOSIS — E119 Type 2 diabetes mellitus without complications: Secondary | ICD-10-CM | POA: Diagnosis not present

## 2016-01-04 DIAGNOSIS — G4733 Obstructive sleep apnea (adult) (pediatric): Secondary | ICD-10-CM | POA: Diagnosis not present

## 2016-01-24 DIAGNOSIS — Z23 Encounter for immunization: Secondary | ICD-10-CM | POA: Diagnosis not present

## 2016-02-09 DIAGNOSIS — G4733 Obstructive sleep apnea (adult) (pediatric): Secondary | ICD-10-CM | POA: Diagnosis not present

## 2016-03-11 DIAGNOSIS — G4733 Obstructive sleep apnea (adult) (pediatric): Secondary | ICD-10-CM | POA: Diagnosis not present

## 2016-04-16 DIAGNOSIS — G4733 Obstructive sleep apnea (adult) (pediatric): Secondary | ICD-10-CM | POA: Diagnosis not present

## 2016-05-02 DIAGNOSIS — R310 Gross hematuria: Secondary | ICD-10-CM | POA: Diagnosis not present

## 2016-05-07 DIAGNOSIS — Z6836 Body mass index (BMI) 36.0-36.9, adult: Secondary | ICD-10-CM | POA: Diagnosis not present

## 2016-05-07 DIAGNOSIS — E782 Mixed hyperlipidemia: Secondary | ICD-10-CM | POA: Diagnosis not present

## 2016-05-07 DIAGNOSIS — G4733 Obstructive sleep apnea (adult) (pediatric): Secondary | ICD-10-CM | POA: Diagnosis not present

## 2016-05-07 DIAGNOSIS — E119 Type 2 diabetes mellitus without complications: Secondary | ICD-10-CM | POA: Diagnosis not present

## 2016-05-07 DIAGNOSIS — I10 Essential (primary) hypertension: Secondary | ICD-10-CM | POA: Diagnosis not present

## 2016-05-07 DIAGNOSIS — Z0000 Encounter for general adult medical examination without abnormal findings: Secondary | ICD-10-CM | POA: Diagnosis not present

## 2016-05-07 DIAGNOSIS — N400 Enlarged prostate without lower urinary tract symptoms: Secondary | ICD-10-CM | POA: Diagnosis not present

## 2016-05-07 DIAGNOSIS — E662 Morbid (severe) obesity with alveolar hypoventilation: Secondary | ICD-10-CM | POA: Diagnosis not present

## 2016-05-07 DIAGNOSIS — Z1389 Encounter for screening for other disorder: Secondary | ICD-10-CM | POA: Diagnosis not present

## 2016-05-13 DIAGNOSIS — R310 Gross hematuria: Secondary | ICD-10-CM | POA: Diagnosis not present

## 2016-05-16 DIAGNOSIS — G4733 Obstructive sleep apnea (adult) (pediatric): Secondary | ICD-10-CM | POA: Diagnosis not present

## 2016-08-14 DIAGNOSIS — G4733 Obstructive sleep apnea (adult) (pediatric): Secondary | ICD-10-CM | POA: Diagnosis not present

## 2016-08-28 DIAGNOSIS — H524 Presbyopia: Secondary | ICD-10-CM | POA: Diagnosis not present

## 2016-09-15 DIAGNOSIS — E119 Type 2 diabetes mellitus without complications: Secondary | ICD-10-CM | POA: Diagnosis not present

## 2016-09-15 DIAGNOSIS — L089 Local infection of the skin and subcutaneous tissue, unspecified: Secondary | ICD-10-CM | POA: Diagnosis not present

## 2016-09-15 DIAGNOSIS — I10 Essential (primary) hypertension: Secondary | ICD-10-CM | POA: Diagnosis not present

## 2016-09-15 DIAGNOSIS — Z7984 Long term (current) use of oral hypoglycemic drugs: Secondary | ICD-10-CM | POA: Diagnosis not present

## 2016-10-22 DIAGNOSIS — G4733 Obstructive sleep apnea (adult) (pediatric): Secondary | ICD-10-CM | POA: Diagnosis not present

## 2016-11-18 DIAGNOSIS — G4733 Obstructive sleep apnea (adult) (pediatric): Secondary | ICD-10-CM | POA: Diagnosis not present

## 2016-11-25 DIAGNOSIS — G4733 Obstructive sleep apnea (adult) (pediatric): Secondary | ICD-10-CM | POA: Diagnosis not present

## 2016-12-11 DIAGNOSIS — J069 Acute upper respiratory infection, unspecified: Secondary | ICD-10-CM | POA: Diagnosis not present

## 2016-12-11 DIAGNOSIS — J029 Acute pharyngitis, unspecified: Secondary | ICD-10-CM | POA: Diagnosis not present

## 2016-12-11 DIAGNOSIS — H6983 Other specified disorders of Eustachian tube, bilateral: Secondary | ICD-10-CM | POA: Diagnosis not present

## 2016-12-16 DIAGNOSIS — I10 Essential (primary) hypertension: Secondary | ICD-10-CM | POA: Diagnosis not present

## 2016-12-16 DIAGNOSIS — E119 Type 2 diabetes mellitus without complications: Secondary | ICD-10-CM | POA: Diagnosis not present

## 2016-12-16 DIAGNOSIS — J449 Chronic obstructive pulmonary disease, unspecified: Secondary | ICD-10-CM | POA: Diagnosis not present

## 2016-12-16 DIAGNOSIS — H6983 Other specified disorders of Eustachian tube, bilateral: Secondary | ICD-10-CM | POA: Diagnosis not present

## 2016-12-24 DIAGNOSIS — G4733 Obstructive sleep apnea (adult) (pediatric): Secondary | ICD-10-CM | POA: Diagnosis not present

## 2017-01-07 DIAGNOSIS — Z23 Encounter for immunization: Secondary | ICD-10-CM | POA: Diagnosis not present

## 2017-01-09 DIAGNOSIS — N400 Enlarged prostate without lower urinary tract symptoms: Secondary | ICD-10-CM | POA: Diagnosis not present

## 2017-01-09 DIAGNOSIS — I10 Essential (primary) hypertension: Secondary | ICD-10-CM | POA: Diagnosis not present

## 2017-01-09 DIAGNOSIS — E782 Mixed hyperlipidemia: Secondary | ICD-10-CM | POA: Diagnosis not present

## 2017-01-09 DIAGNOSIS — J449 Chronic obstructive pulmonary disease, unspecified: Secondary | ICD-10-CM | POA: Diagnosis not present

## 2017-01-23 DIAGNOSIS — G4733 Obstructive sleep apnea (adult) (pediatric): Secondary | ICD-10-CM | POA: Diagnosis not present

## 2017-02-23 DIAGNOSIS — G4733 Obstructive sleep apnea (adult) (pediatric): Secondary | ICD-10-CM | POA: Diagnosis not present

## 2017-02-27 DIAGNOSIS — R310 Gross hematuria: Secondary | ICD-10-CM | POA: Diagnosis not present

## 2017-03-16 IMAGING — DX DG CHEST 1V PORT
1 series · 1 of 1 positions shown · non-contrast
Comparison: 03/25/2013

CLINICAL DATA: Patient greater mid abdominal pain and cramping.
Some shortness of breath. Former smoker. History of hypertension and
diabetes.

EXAM:
PORTABLE CHEST 1 VIEW

[chest ap]
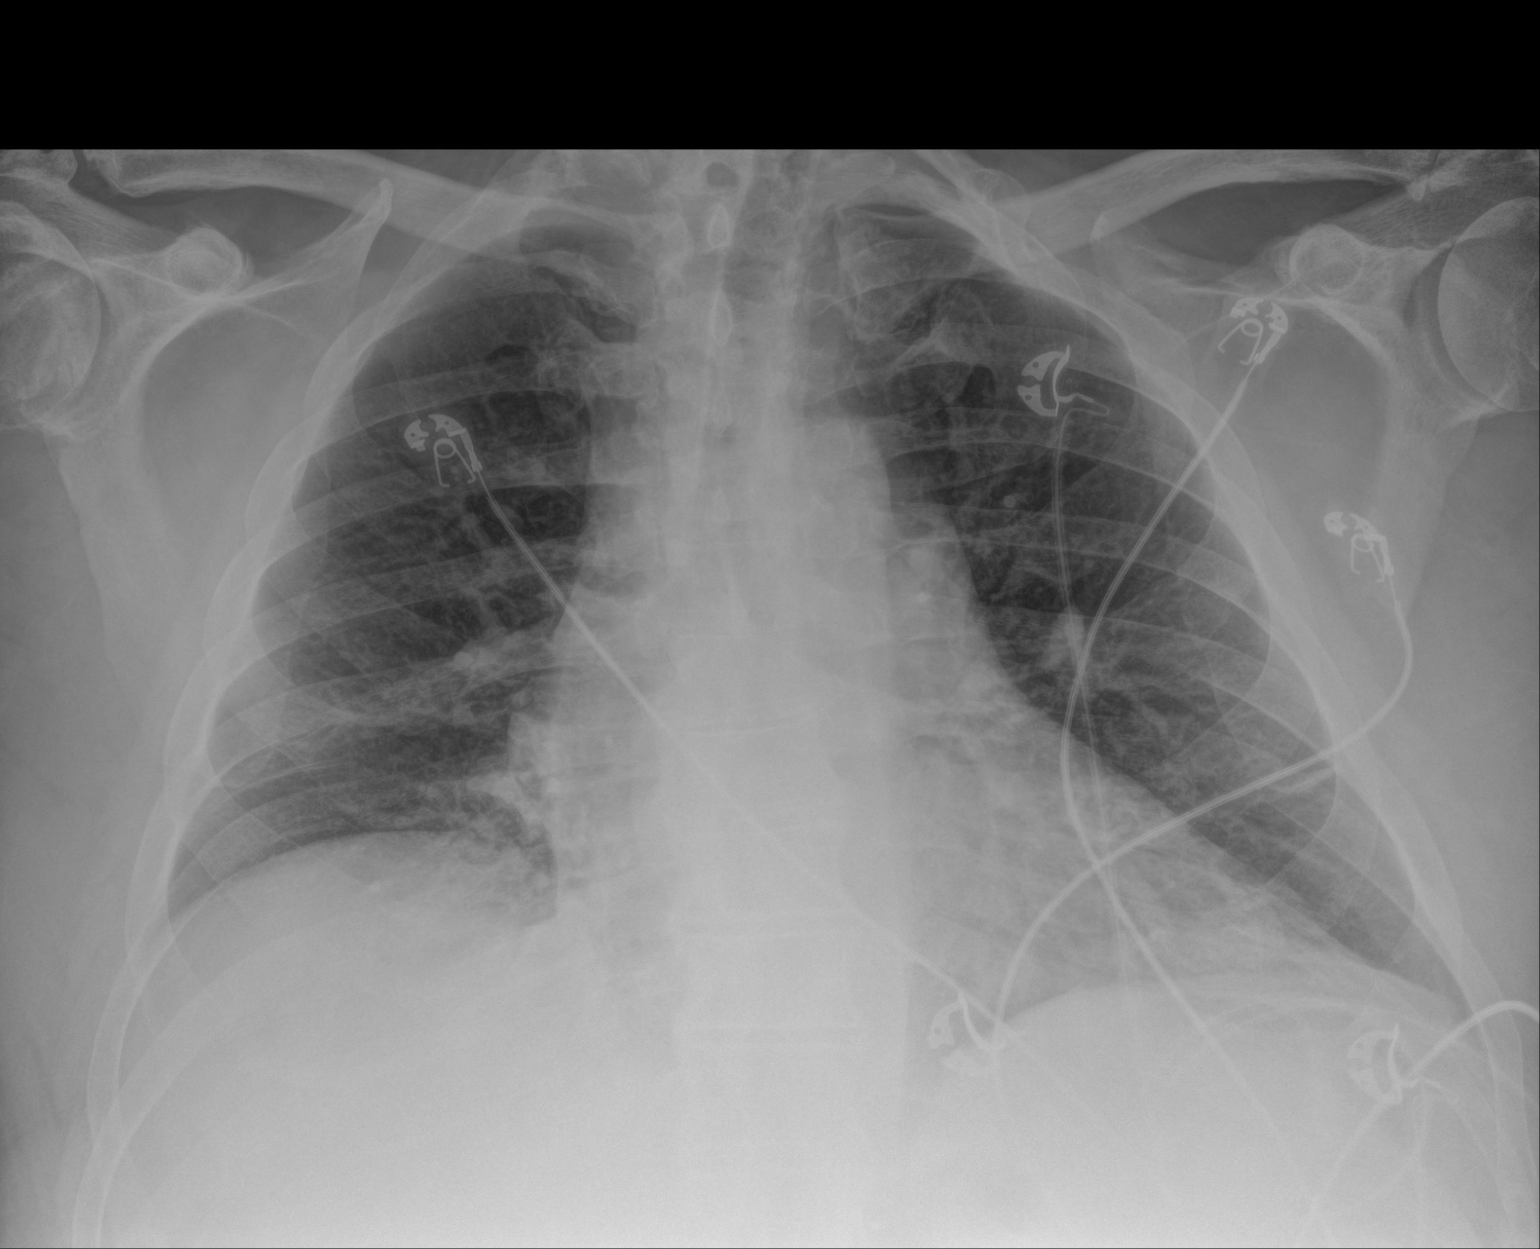

[1 of 1 positions shown; findings below may reference images not displayed]

FINDINGS: There is mild opacity at the lung bases most likely atelectasis. No
evidence of pulmonary edema. No apparent pleural effusion and no
evidence of a pneumothorax.

Cardiac silhouette is normal in size. No mediastinal or hilar masses
or evidence of adenopathy.

Bony thorax is grossly intact.
IMPRESSION: 1. No acute cardiopulmonary disease.
2. Mild lung base atelectasis.

## 2017-03-18 IMAGING — RF DG ERCP WO/W SPHINCTEROTOMY
1 series · 8 of 8 positions shown · non-contrast
Comparison: MRCP study earlier today.

CLINICAL DATA: Cholelithiasis with possible choledocholithiasis.

EXAM:
ERCP
TECHNIQUE: Multiple spot images obtained with the fluoroscopic device and
submitted for interpretation post-procedure.

[Series 1: run · 8 of 8 slices shown]
[im 1/8]
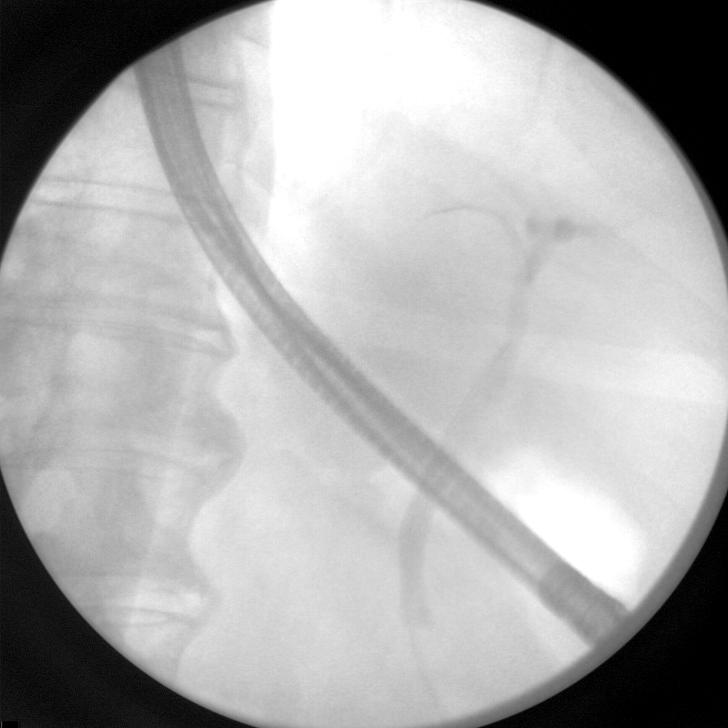
[im 2/8]
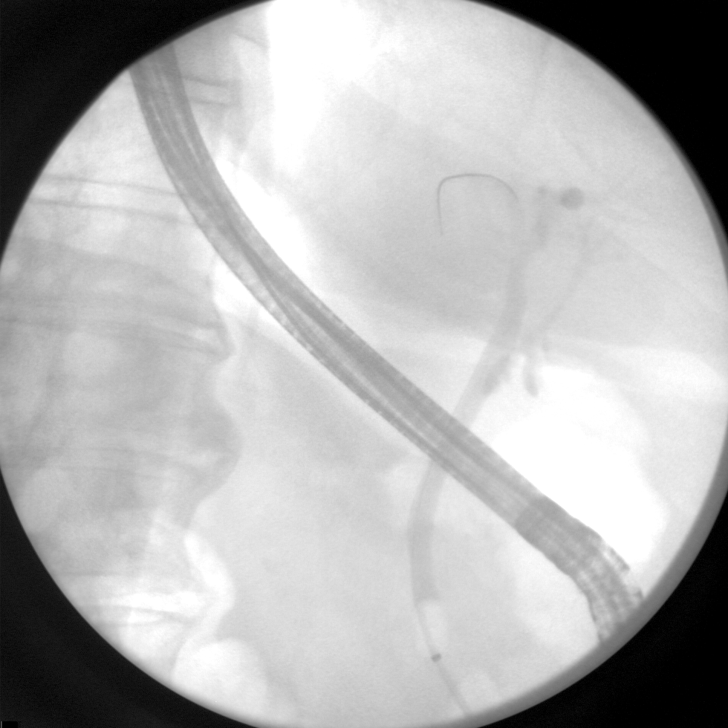
[im 3/8]
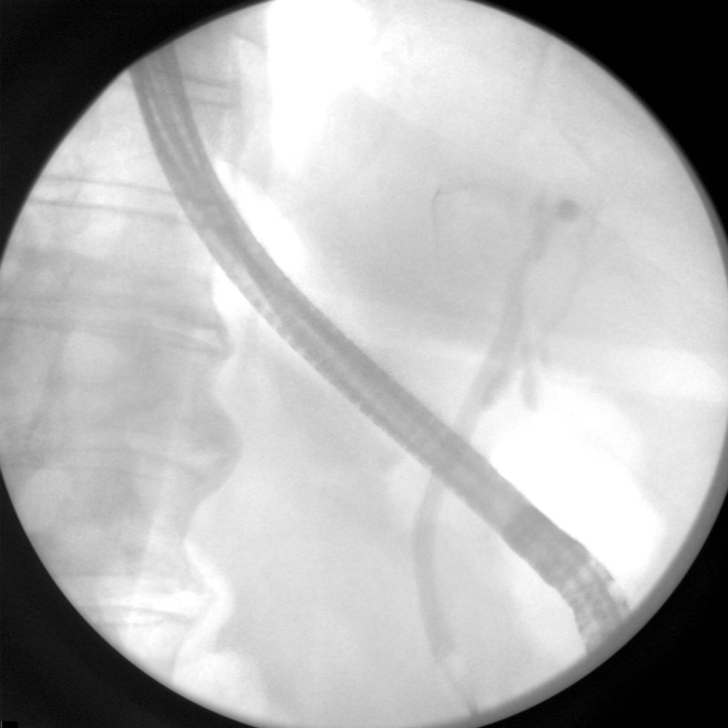
[im 4/8]
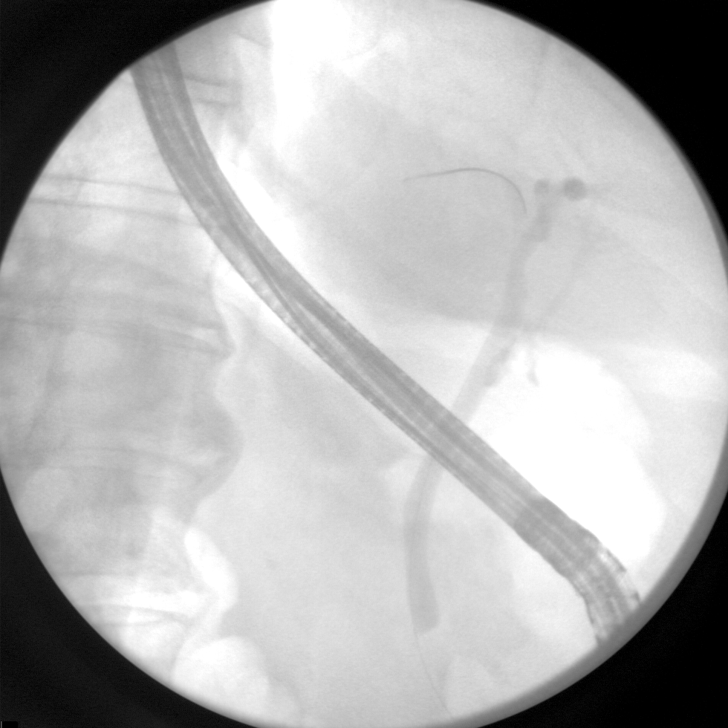
[im 5/8]
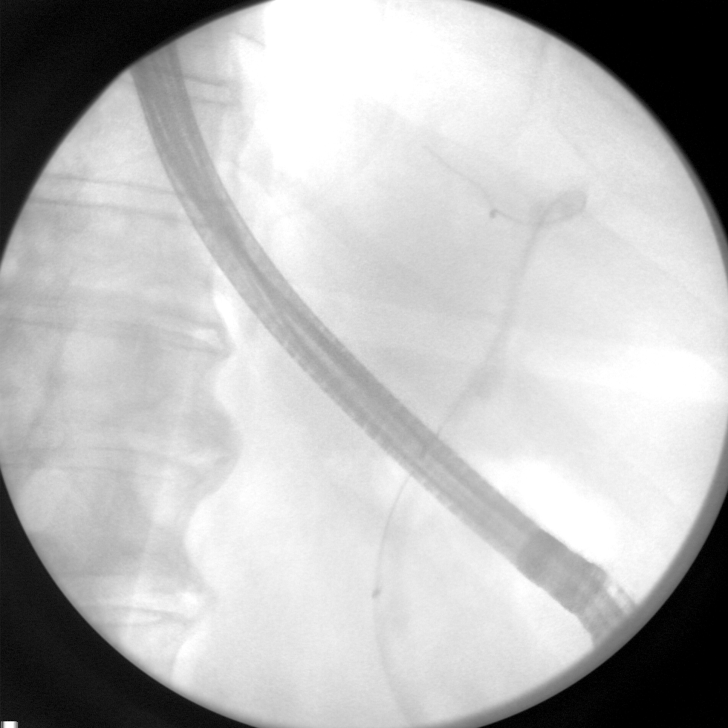
[im 6/8]
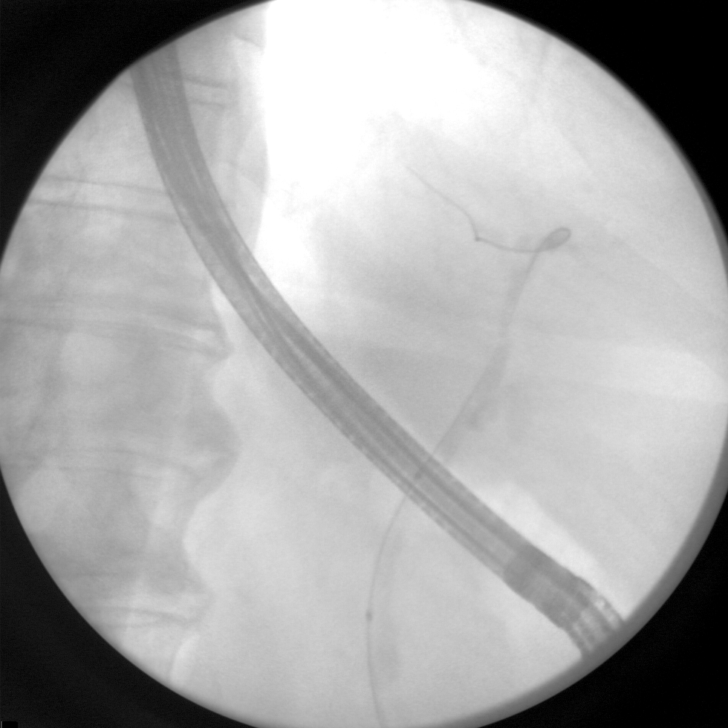
[im 7/8]
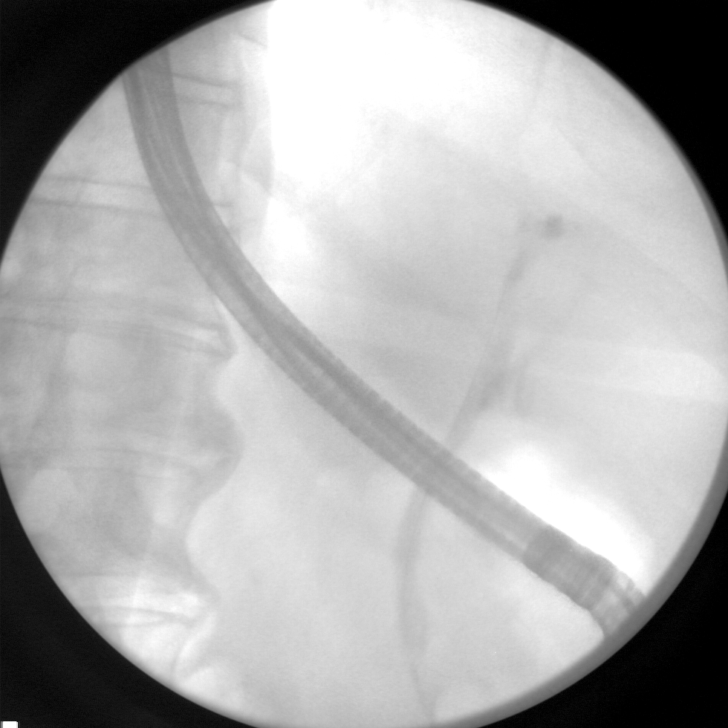
[im 8/8]
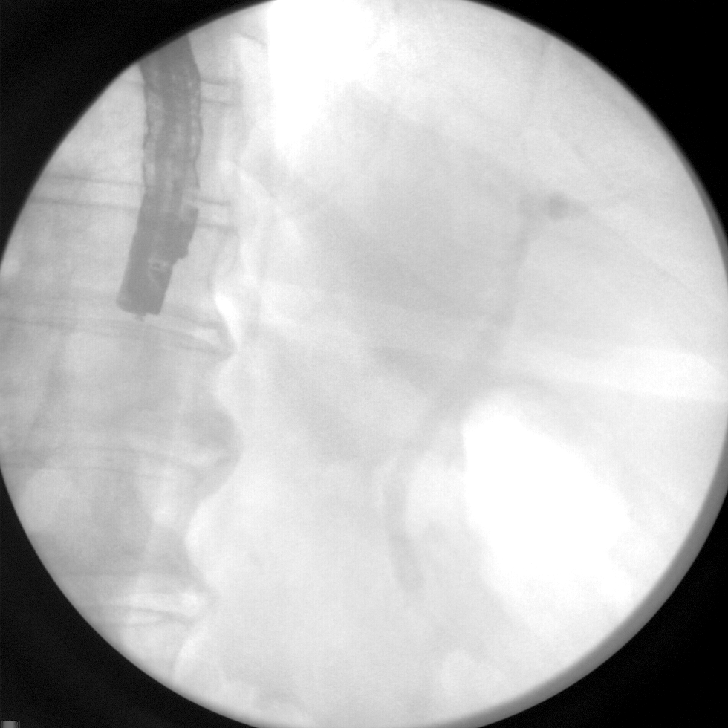

[8 of 8 positions shown; findings below may reference images not displayed]

FINDINGS: Imaging from an ERCP procedure with a C-arm demonstrates cannulation
of the common bile duct. Contrast injection shows a normal caliber
duct and visualized intrahepatic ducts. No filling defects are seen
on the submitted imaging. No evidence of contrast extravasation.
IMPRESSION: Normal caliber bile ducts by ERCP with no filling defects
identified.

These images were submitted for radiologic interpretation only.
Please see the procedural report for the amount of contrast and the
fluoroscopy time utilized.

## 2017-05-07 DIAGNOSIS — E119 Type 2 diabetes mellitus without complications: Secondary | ICD-10-CM | POA: Diagnosis not present

## 2017-05-07 DIAGNOSIS — E782 Mixed hyperlipidemia: Secondary | ICD-10-CM | POA: Diagnosis not present

## 2017-05-07 DIAGNOSIS — G4733 Obstructive sleep apnea (adult) (pediatric): Secondary | ICD-10-CM | POA: Diagnosis not present

## 2017-05-07 DIAGNOSIS — I10 Essential (primary) hypertension: Secondary | ICD-10-CM | POA: Diagnosis not present

## 2017-05-07 DIAGNOSIS — Z1159 Encounter for screening for other viral diseases: Secondary | ICD-10-CM | POA: Diagnosis not present

## 2017-06-26 DIAGNOSIS — Z86010 Personal history of colonic polyps: Secondary | ICD-10-CM | POA: Diagnosis not present

## 2017-06-26 DIAGNOSIS — K5730 Diverticulosis of large intestine without perforation or abscess without bleeding: Secondary | ICD-10-CM | POA: Diagnosis not present

## 2017-06-26 DIAGNOSIS — K640 First degree hemorrhoids: Secondary | ICD-10-CM | POA: Diagnosis not present

## 2017-06-26 DIAGNOSIS — K621 Rectal polyp: Secondary | ICD-10-CM | POA: Diagnosis not present

## 2017-06-30 DIAGNOSIS — K621 Rectal polyp: Secondary | ICD-10-CM | POA: Diagnosis not present

## 2017-07-01 DIAGNOSIS — E119 Type 2 diabetes mellitus without complications: Secondary | ICD-10-CM | POA: Diagnosis not present

## 2017-08-10 DIAGNOSIS — I10 Essential (primary) hypertension: Secondary | ICD-10-CM | POA: Diagnosis not present

## 2017-08-10 DIAGNOSIS — E119 Type 2 diabetes mellitus without complications: Secondary | ICD-10-CM | POA: Diagnosis not present

## 2017-08-23 DIAGNOSIS — G4733 Obstructive sleep apnea (adult) (pediatric): Secondary | ICD-10-CM | POA: Diagnosis not present

## 2017-09-10 DIAGNOSIS — H5203 Hypermetropia, bilateral: Secondary | ICD-10-CM | POA: Diagnosis not present

## 2017-09-11 DIAGNOSIS — G4733 Obstructive sleep apnea (adult) (pediatric): Secondary | ICD-10-CM | POA: Diagnosis not present

## 2017-12-11 DIAGNOSIS — I10 Essential (primary) hypertension: Secondary | ICD-10-CM | POA: Diagnosis not present

## 2017-12-11 DIAGNOSIS — E1165 Type 2 diabetes mellitus with hyperglycemia: Secondary | ICD-10-CM | POA: Diagnosis not present

## 2017-12-11 DIAGNOSIS — G4733 Obstructive sleep apnea (adult) (pediatric): Secondary | ICD-10-CM | POA: Diagnosis not present

## 2017-12-11 DIAGNOSIS — E1169 Type 2 diabetes mellitus with other specified complication: Secondary | ICD-10-CM | POA: Diagnosis not present

## 2018-01-25 DIAGNOSIS — N400 Enlarged prostate without lower urinary tract symptoms: Secondary | ICD-10-CM | POA: Diagnosis not present

## 2018-01-25 DIAGNOSIS — R310 Gross hematuria: Secondary | ICD-10-CM | POA: Diagnosis not present

## 2018-01-27 DIAGNOSIS — Z23 Encounter for immunization: Secondary | ICD-10-CM | POA: Diagnosis not present

## 2018-02-03 DIAGNOSIS — N210 Calculus in bladder: Secondary | ICD-10-CM | POA: Diagnosis not present

## 2018-02-03 DIAGNOSIS — K5730 Diverticulosis of large intestine without perforation or abscess without bleeding: Secondary | ICD-10-CM | POA: Diagnosis not present

## 2018-02-03 DIAGNOSIS — R310 Gross hematuria: Secondary | ICD-10-CM | POA: Diagnosis not present

## 2018-02-11 DIAGNOSIS — R310 Gross hematuria: Secondary | ICD-10-CM | POA: Diagnosis not present

## 2018-02-15 ENCOUNTER — Other Ambulatory Visit: Payer: Self-pay | Admitting: Urology

## 2018-02-18 ENCOUNTER — Encounter (HOSPITAL_COMMUNITY): Payer: Self-pay | Admitting: *Deleted

## 2018-03-08 ENCOUNTER — Encounter (HOSPITAL_COMMUNITY): Payer: Self-pay

## 2018-03-08 NOTE — Patient Instructions (Signed)
Joseph Clay  03/08/2018   Your procedure is scheduled on: 03-18-18  Report to [facility] Main  Entrance  Report to admitting at     0530 AM    Call this number if you have problems the morning of surgery [phone]    Remember: Do not eat food or drink liquids :After Midnight.  BRUSH YOUR TEETH MORNING OF SURGERY AND RINSE YOUR MOUTH OUT, NO CHEWING GUM CANDY OR MINTS.     Take these medicines the morning of surgery with A SIP OF WATER: amlodipine DO NOT TAKE ANY DIABETIC MEDICATIONS DAY OF YOUR SURGERY                               You may not have any metal on your body including hair pins and              piercings  Do not wear jewelry,  lotions, powders or perfumes, deodorant            .              Men may shave face and neck.   Do not bring valuables to the hospital. [place] IS NOT             RESPONSIBLE   FOR VALUABLES.  Contacts, dentures or bridgework may not be worn into surgery.  Leave suitcase in the car. After surgery it may be brought to your room.                Please read over the following fact sheets you were given: _____________________________________________________________________            [facility] - Preparing for Surgery Before surgery, you can play an important role.  Because skin is not sterile, your skin needs to be as free of germs as possible.  You can reduce the number of germs on your skin by washing with CHG (chlorahexidine gluconate) soap before surgery.  CHG is an antiseptic cleaner which kills germs and bonds with the skin to continue killing germs even after washing. Please DO NOT use if you have an allergy to CHG or antibacterial soaps.  If your skin becomes reddened/irritated stop using the CHG and inform your nurse when you arrive at Short Stay. Do not shave (including legs and underarms) for at least 48 hours prior to the first CHG shower.  You may shave your face/neck. Please follow these  instructions carefully:  1.  Shower with CHG Soap the night before surgery and the  morning of Surgery.  2.  If you choose to wash your hair, wash your hair first as usual with your  normal  shampoo.  3.  After you shampoo, rinse your hair and body thoroughly to remove the  shampoo.                           4.  Use CHG as you would any other liquid soap.  You can apply chg directly  to the skin and wash                       Gently with a scrungie or clean washcloth.  5.  Apply the CHG Soap to your body ONLY FROM THE NECK DOWN.   Do  not use on face/ open                           Wound or open sores. Avoid contact with eyes, ears mouth and genitals (private parts).                       Wash face,  Genitals (private parts) with your normal soap.             6.  Wash thoroughly, paying special attention to the area where your surgery  will be performed.  7.  Thoroughly rinse your body with warm water from the neck down.  8.  DO NOT shower/wash with your normal soap after using and rinsing off  the CHG Soap.                9.  Pat yourself dry with a clean towel.            10.  Wear clean pajamas.            11.  Place clean sheets on your bed the night of your first shower and do not  sleep with pets. Day of Surgery : Do not apply any lotions/deodorants the morning of surgery.  Please wear clean clothes to the hospital/surgery center.  FAILURE TO FOLLOW THESE INSTRUCTIONS MAY RESULT IN THE CANCELLATION OF YOUR SURGERY PATIENT SIGNATURE_________________________________  NURSE SIGNATURE__________________________________  ________________________________________________________________________

## 2018-03-13 DIAGNOSIS — G4733 Obstructive sleep apnea (adult) (pediatric): Secondary | ICD-10-CM | POA: Diagnosis not present

## 2018-03-15 ENCOUNTER — Encounter (HOSPITAL_COMMUNITY)
Admission: RE | Admit: 2018-03-15 | Discharge: 2018-03-15 | Disposition: A | Discharge status: Home / Self Care | Payer: Medicare Other | Source: Ambulatory Visit | Attending: Urology | Admitting: Urology

## 2018-03-15 ENCOUNTER — Encounter (HOSPITAL_COMMUNITY): Payer: Self-pay

## 2018-03-15 ENCOUNTER — Other Ambulatory Visit: Payer: Self-pay

## 2018-03-15 DIAGNOSIS — Z7984 Long term (current) use of oral hypoglycemic drugs: Secondary | ICD-10-CM | POA: Diagnosis not present

## 2018-03-15 DIAGNOSIS — N210 Calculus in bladder: Secondary | ICD-10-CM | POA: Diagnosis not present

## 2018-03-15 DIAGNOSIS — E119 Type 2 diabetes mellitus without complications: Secondary | ICD-10-CM | POA: Diagnosis not present

## 2018-03-15 DIAGNOSIS — Z79899 Other long term (current) drug therapy: Secondary | ICD-10-CM | POA: Diagnosis not present

## 2018-03-15 DIAGNOSIS — N401 Enlarged prostate with lower urinary tract symptoms: Secondary | ICD-10-CM | POA: Diagnosis not present

## 2018-03-15 DIAGNOSIS — G4730 Sleep apnea, unspecified: Secondary | ICD-10-CM | POA: Diagnosis not present

## 2018-03-15 DIAGNOSIS — Z7982 Long term (current) use of aspirin: Secondary | ICD-10-CM | POA: Diagnosis not present

## 2018-03-15 DIAGNOSIS — R310 Gross hematuria: Secondary | ICD-10-CM | POA: Diagnosis not present

## 2018-03-15 DIAGNOSIS — Z01818 Encounter for other preprocedural examination: Secondary | ICD-10-CM | POA: Insufficient documentation

## 2018-03-15 DIAGNOSIS — N138 Other obstructive and reflux uropathy: Secondary | ICD-10-CM | POA: Diagnosis not present

## 2018-03-15 DIAGNOSIS — Z8249 Family history of ischemic heart disease and other diseases of the circulatory system: Secondary | ICD-10-CM | POA: Diagnosis not present

## 2018-03-15 DIAGNOSIS — Z87891 Personal history of nicotine dependence: Secondary | ICD-10-CM | POA: Diagnosis not present

## 2018-03-15 DIAGNOSIS — E7800 Pure hypercholesterolemia, unspecified: Secondary | ICD-10-CM | POA: Diagnosis not present

## 2018-03-15 DIAGNOSIS — Z6836 Body mass index (BMI) 36.0-36.9, adult: Secondary | ICD-10-CM | POA: Diagnosis not present

## 2018-03-15 DIAGNOSIS — I10 Essential (primary) hypertension: Secondary | ICD-10-CM | POA: Diagnosis not present

## 2018-03-15 LAB — HEMOGLOBIN A1C
Hgb A1c MFr Bld: 6.6 % — ABNORMAL HIGH (ref 4.8–5.6)
Mean Plasma Glucose: 142.72 mg/dL

## 2018-03-15 LAB — COMPREHENSIVE METABOLIC PANEL
ALT: 22 U/L (ref 0–44)
AST: 19 U/L (ref 15–41)
Albumin: 4.1 g/dL (ref 3.5–5.0)
Alkaline Phosphatase: 52 U/L (ref 38–126)
Anion gap: 9 (ref 5–15)
BUN: 18 mg/dL (ref 8–23)
CO2: 30 mmol/L (ref 22–32)
Calcium: 9.2 mg/dL (ref 8.9–10.3)
Chloride: 103 mmol/L (ref 98–111)
Creatinine, Ser: 1.01 mg/dL (ref 0.61–1.24)
GFR calc Af Amer: >60 mL/min (ref >60)
GFR calc non Af Amer: >60 mL/min (ref >60)
GLUCOSE: 203 mg/dL — AB (ref 70–99)
Potassium: 4.5 mmol/L (ref 3.5–5.1)
Sodium: 142 mmol/L (ref 135–145)
Total Bilirubin: 0.8 mg/dL (ref 0.3–1.2)
Total Protein: 7.2 g/dL (ref 6.5–8.1)

## 2018-03-15 LAB — CBC
HCT: 39.1 % (ref 39.0–52.0)
Hemoglobin: 12.3 g/dL — ABNORMAL LOW (ref 13.0–17.0)
MCH: 27.3 pg (ref 26.0–34.0)
MCHC: 31.5 g/dL (ref 30.0–36.0)
MCV: 86.7 fL (ref 80.0–100.0)
NRBC: 0 % (ref 0.0–0.2)
PLATELETS: 185 10*3/uL (ref 150–400)
RBC: 4.51 MIL/uL (ref 4.22–5.81)
RDW: 13.9 % (ref 11.5–15.5)
WBC: 7.2 10*3/uL (ref 4.0–10.5)

## 2018-03-15 NOTE — Progress Notes (Signed)
Chart left with Tamika for follow up

## 2018-03-17 NOTE — Anesthesia Preprocedure Evaluation (Addendum)
Anesthesia Evaluation  Patient identified by MRN, date of birth, ID band Patient awake    Reviewed: Allergy & Precautions, NPO status , Patient's Chart, lab work & pertinent test results  History of Anesthesia Complications Negative for: history of anesthetic complications  Airway Mallampati: II  TM Distance: >3 FB Neck ROM: Full    Dental  (+) Dental Advisory Given   Pulmonary sleep apnea and Continuous Positive Airway Pressure Ventilation , former smoker,    breath sounds clear to auscultation       Cardiovascular hypertension, Pt. on medications (-) angina Rhythm:Regular Rate:Normal     Neuro/Psych negative neurological ROS     GI/Hepatic GERD  Controlled,H/o elevated LFTs   Endo/Other  diabetes (glu 117), Oral Hypoglycemic AgentsMorbid obesity  Renal/GU Renal InsufficiencyRenal disease     Musculoskeletal   Abdominal (+) + obese,   Peds  Hematology negative hematology ROS (+)   Anesthesia Other Findings   Reproductive/Obstetrics                            Anesthesia Physical Anesthesia Plan  ASA: III  Anesthesia Plan: General   Post-op Pain Management:    Induction: Intravenous  PONV Risk Score and Plan: 2 and Ondansetron and Dexamethasone  Airway Management Planned: Oral ETT  Additional Equipment:   Intra-op Plan:   Post-operative Plan: Extubation in OR  Informed Consent: I have reviewed the patients History and Physical, chart, labs and discussed the procedure including the risks, benefits and alternatives for the proposed anesthesia with the patient or authorized representative who has indicated his/her understanding and acceptance.   Dental advisory given  Plan Discussed with: CRNA and Surgeon  Anesthesia Plan Comments: (Plan routine monitors, GETA)        Anesthesia Quick Evaluation

## 2018-03-17 NOTE — H&P (Signed)
CC/HPI: Hematuria   He follows up today after having developed intermittent gross hematuria over the last couple of months. He had last undergone cystoscopy approximately 1 and half years ago. He has passed some clots and had some difficulty emptying although now is voiding well with clear urine. He has not had any associated dysuria, flank pain, fever, or abdominal pain complaints. He follows up today after undergoing CT imaging.     ALLERGIES: No Allergies    MEDICATIONS: Amlodipine Besylate 5 mg tablet Oral  Aspirin 81 MG TABS Oral  Calcium + D TABS Oral  Furosemide 40 mg tablet Oral  Ibuprofen 800 mg tablet Oral  Klor-Con M20 20 meq tablet, ext release, particles/crystals Oral  Metformin Hcl 500 mg tablet Oral  Pravastatin Sodium 40 mg tablet Oral     GU PSH: Cystoscopy - 2018 Cystoscopy TURP - 2013 Locm 300-399Mg /Ml Iodine,1Ml - 02/03/2018      PSH Notes: Shoulder Surgery, Transurethral Resection Of Prostate (TURP)   NON-GU PSH: Cholecystectomy (open)    GU PMH: Gross hematuria (Stable) - 02/27/2017, - 2018 BPH w/o LUTS - 2018 Male ED, unspecified, Erectile dysfunction - 2015 BPH w/LUTS, Benign prostatic hyperplasia (BPH) with straining on urination - 2015 Encounter for Prostate Cancer screening, Prostate cancer screening - 2014 Hematuria, Unspec, Hematuria - 2014 Hydronephrosis Unspec, Hydronephrosis - 2014 Urinary Retention, Unspec, Urinary retention - 2014      PMH Notes:   1) Hematuria: He has a history of gross hematuria and has been evaluated in the past by Dr. Wanda Plump including an unenhanced CT scan in October 2008 which was unremarkable. He also underwent cystoscopy by Dr. Wanda Plump at that time which was negative except for large prostatic vessels indicating this as the most likely source of his hematuria. He has not undergone a contrasted upper tract study. He does have a history of smoking and smoked 1-2 packs for 20 years before quitting around 1990.  He has refused contrasted imaging of his upper urinary tract.   Aug-Sep 2019: Recurrent gross hematuria  Oct 2019: CT imaging- , cysto -   2) Prostate cancer screening: He receives screening through Dr. Kirby Funk.   3) BPH / urinary retention: He developed postoperative urinary retention in February 2013 after shoulder surgery. He developed acute renal failure due to retention with resolution after catheter placement. He failed a voiding trial on alpha blocker medication and underwent a TURP on 06/16/11. He failed multiple voiding trials after his TURP. A subsequent urodynamic study indicated poor detrusor function although not completely absent. He ultimately passed a voiding trial in May 2013 after a prolonged period on bladder rest.   Current treatment: None   Mar 2013: TURP (pathology benign)     NON-GU PMH: Diabetes Type 2 Hypercholesterolemia Hypertension    FAMILY HISTORY: Heart Disease - Mother   SOCIAL HISTORY: Marital Status: Single Preferred Language: English; Ethnicity: Not Hispanic Or Latino; Race: White Current Smoking Status: Patient does not smoke anymore.   Tobacco Use Assessment Completed: Used Tobacco in last 30 days? Does drink.  Drinks 1 caffeinated drink per day.     Notes: Former smoker, Marital History - Single, Tobacco Use, Occupation:   REVIEW OF SYSTEMS:    GU Review Male:   Patient denies frequent urination, hard to postpone urination, burning/ pain with urination, get up at night to urinate, leakage of urine, stream starts and stops, trouble starting your streams, and have to strain to urinate .  Gastrointestinal (Lower):  Patient denies diarrhea and constipation.  Gastrointestinal (Upper):   Patient denies vomiting and nausea.  Constitutional:   Patient denies fever, night sweats, weight loss, and fatigue.  Skin:   Patient denies skin rash/ lesion and itching.  Eyes:   Patient denies blurred vision and double vision.  Ears/ Nose/ Throat:   Patient  denies sore throat and sinus problems.  Hematologic/Lymphatic:   Patient denies swollen glands and easy bruising.  Cardiovascular:   Patient denies leg swelling and chest pains.  Respiratory:   Patient denies cough and shortness of breath.  Endocrine:   Patient denies excessive thirst.  Musculoskeletal:   Patient denies back pain and joint pain.  Neurological:   Patient denies headaches and dizziness.  Psychologic:   Patient denies depression and anxiety.   VITAL SIGNS:      02/11/2018 01:04 PM  Weight 280 lb / 127.01 kg  Height 74 in / 187.96 cm  BP 151/75 mmHg  Pulse 91 /min  BMI 35.9 kg/m   MULTI-SYSTEM PHYSICAL EXAMINATION:    Constitutional: Well-nourished. No physical deformities. Normally developed. Good grooming.  Gastrointestinal: No mass, no tenderness, no rigidity, non obese abdomen. No CVA tenderness.     PAST DATA REVIEWED:  Source Of History:  Patient  X-Ray Review: C.T. Hematuria: Reviewed Films.     02/05/07  PSA  Total PSA 1.36    Notes:                     I independently reviewed his CT scan. This demonstrates no concerning renal lesions or upper tract abnormalities to explain his hematuria. His bladder does demonstrate multiple calculi as well as hyperdense material consistent with probable clot. No pelvic lymphadenopathy is noted. He was noted to have atherosclerotic disease of the aorta and coronary arteries.   PROCEDURES:         Flexible Cystoscopy - 52000  Indication: Gross hematuria Risks, benefits, and potential complications of the procedure were discussed with the patient including infection, bleeding, voiding discomfort, urinary retention, fever, chills, sepsis, and others. All questions were answered. Informed consent was obtained. Sterile technique and intraurethral analgesia were used.  Meatus:  Normal size. Normal location. Normal condition.  Urethra:  No strictures.  External Sphincter:  Normal.  Verumontanum:  Normal.  Prostate:  Moderate  hyperplasia. Non-obstructing. No median lobe is noted.  Bladder Neck:  Non-obstructing.  Ureteral Orifices:  Normal location. Normal size. Normal shape. Effluxed clear urine.  Bladder:  He does have a mild trabeculation throughout the bladder. He has large, multiple bladder calculi and old formed clot within the bladder. No bladder tumors or other mucosal abnormalities are noted.      Chaperone: Clayborne Dana The procedure was well-tolerated and without complications. Instructions were given to call the office immediately if questions or problems.         Urinalysis w/Scope - 81001 Dipstick Dipstick Cont'd Micro  Color: Yellow Bilirubin: Neg WBC/hpf: 0 - 5/hpf  Appearance: Clear Ketones: Neg RBC/hpf: 0 - 2/hpf  Specific Gravity: 1.015 Blood: Trace Bacteria: NS (Not Seen)  pH: <=5.0 Protein: Neg Cystals: NS (Not Seen)  Glucose: Neg Urobilinogen: 0.2 Casts: NS (Not Seen)    Nitrites: Neg Trichomonas: Not Present    Leukocyte Esterase: Neg Mucous: Not Present      Epithelial Cells: 0 - 5/hpf      Yeast: NS (Not Seen)      Sperm: Not Present    Notes:      ASSESSMENT:  ICD-10 Details  1 GU:   Gross hematuria - R31.0    PLAN:           Schedule Return Visit/Planned Activity: Other See Visit Notes             Note: Will call to schedule surgery          Document Letter(s):  Created for Thora LanceJohn J. Griffin, MD   Created for Patient: Clinical Summary         Notes:   1. Hematuria: His hematuria is most likely related to his BPH. In addition, he does have bladder calculi. He was reassured that there are no other concerning causes for his hematuria.   2. BPH/LUTS/bladder calculi: Considering his hematuria, bladder calculi, and BPH is the likely source for his hematuria, we did discuss options for management. Ultimately, I did recommend that he consider cystoscopy and transurethral resection of the prostate along with cystolitholapaxy for definitive treatment. We did discuss alternatives  including medical therapy for his BPH with 5 alpha reductase inhibitors or continued observation. Ultimately, he is agreeable to proceed with surgical treatment as outlined above. We have reviewed the potential risks, complications, and the expected recovery process. He would like to consider proceeding in early December after an upcoming trip.   Cc: Dr. Kirby FunkJohn Griffin

## 2018-03-18 ENCOUNTER — Observation Stay (HOSPITAL_COMMUNITY)
Admission: RE | Admit: 2018-03-18 | Discharge: 2018-03-19 | Disposition: A | Discharge status: Home / Self Care | Payer: Medicare Other | Source: Ambulatory Visit | Attending: Urology | Admitting: Urology

## 2018-03-18 ENCOUNTER — Encounter (HOSPITAL_COMMUNITY)
Admission: RE | Disposition: A | Discharge status: Home / Self Care | Payer: Self-pay | Source: Ambulatory Visit | Attending: Urology

## 2018-03-18 ENCOUNTER — Ambulatory Visit (HOSPITAL_COMMUNITY): Payer: Medicare Other | Admitting: Certified Registered Nurse Anesthetist

## 2018-03-18 ENCOUNTER — Encounter (HOSPITAL_COMMUNITY): Payer: Self-pay

## 2018-03-18 DIAGNOSIS — N138 Other obstructive and reflux uropathy: Secondary | ICD-10-CM | POA: Diagnosis not present

## 2018-03-18 DIAGNOSIS — R310 Gross hematuria: Secondary | ICD-10-CM | POA: Insufficient documentation

## 2018-03-18 DIAGNOSIS — G4730 Sleep apnea, unspecified: Secondary | ICD-10-CM | POA: Insufficient documentation

## 2018-03-18 DIAGNOSIS — N210 Calculus in bladder: Secondary | ICD-10-CM | POA: Diagnosis not present

## 2018-03-18 DIAGNOSIS — I10 Essential (primary) hypertension: Secondary | ICD-10-CM | POA: Insufficient documentation

## 2018-03-18 DIAGNOSIS — N401 Enlarged prostate with lower urinary tract symptoms: Principal | ICD-10-CM | POA: Insufficient documentation

## 2018-03-18 DIAGNOSIS — E119 Type 2 diabetes mellitus without complications: Secondary | ICD-10-CM | POA: Diagnosis not present

## 2018-03-18 DIAGNOSIS — Z6836 Body mass index (BMI) 36.0-36.9, adult: Secondary | ICD-10-CM | POA: Diagnosis not present

## 2018-03-18 DIAGNOSIS — E7800 Pure hypercholesterolemia, unspecified: Secondary | ICD-10-CM | POA: Insufficient documentation

## 2018-03-18 DIAGNOSIS — Z7984 Long term (current) use of oral hypoglycemic drugs: Secondary | ICD-10-CM | POA: Diagnosis not present

## 2018-03-18 DIAGNOSIS — Z7982 Long term (current) use of aspirin: Secondary | ICD-10-CM | POA: Diagnosis not present

## 2018-03-18 DIAGNOSIS — Z87891 Personal history of nicotine dependence: Secondary | ICD-10-CM | POA: Insufficient documentation

## 2018-03-18 DIAGNOSIS — N320 Bladder-neck obstruction: Secondary | ICD-10-CM | POA: Diagnosis not present

## 2018-03-18 DIAGNOSIS — Z8249 Family history of ischemic heart disease and other diseases of the circulatory system: Secondary | ICD-10-CM | POA: Insufficient documentation

## 2018-03-18 DIAGNOSIS — Z79899 Other long term (current) drug therapy: Secondary | ICD-10-CM | POA: Diagnosis not present

## 2018-03-18 DIAGNOSIS — N400 Enlarged prostate without lower urinary tract symptoms: Secondary | ICD-10-CM | POA: Diagnosis present

## 2018-03-18 HISTORY — PX: TRANSURETHRAL RESECTION OF PROSTATE: SHX73

## 2018-03-18 LAB — GLUCOSE, CAPILLARY
GLUCOSE-CAPILLARY: 194 mg/dL — AB (ref 70–99)
Glucose-Capillary: 204 mg/dL — ABNORMAL HIGH (ref 70–99)
Glucose-Capillary: 208 mg/dL — ABNORMAL HIGH (ref 70–99)
Glucose-Capillary: 174 mg/dL — ABNORMAL HIGH (ref 70–99)
Glucose-Capillary: 169 mg/dL — ABNORMAL HIGH (ref 70–99)

## 2018-03-18 SURGERY — TURP (TRANSURETHRAL RESECTION OF PROSTATE)
Anesthesia: General

## 2018-03-18 MED ORDER — INSULIN ASPART 100 UNIT/ML ~~LOC~~ SOLN: 0.0000 [IU] | Freq: Every day | SUBCUTANEOUS | Status: DC

## 2018-03-18 MED ORDER — ONDANSETRON HCL 4 MG/2ML IJ SOLN
INTRAMUSCULAR | Status: AC
  Filled 2018-03-18: qty 2

## 2018-03-18 MED ORDER — MIDAZOLAM HCL 2 MG/2ML IJ SOLN
INTRAMUSCULAR | Status: AC
  Filled 2018-03-18: qty 2

## 2018-03-18 MED ORDER — FUROSEMIDE 40 MG PO TABS
40.0000 mg | ORAL_TABLET | Freq: Every day | ORAL | Status: DC
  Administered 2018-03-18 – 2018-03-19 (×2): 40 mg via ORAL
  Filled 2018-03-18 (×3): qty 1

## 2018-03-18 MED ORDER — STERILE WATER FOR IRRIGATION IR SOLN
Status: DC | PRN
  Administered 2018-03-18: 6000 mL via INTRAVESICAL

## 2018-03-18 MED ORDER — PRAVASTATIN SODIUM 20 MG PO TABS
20.0000 mg | ORAL_TABLET | Freq: Every evening | ORAL | Status: DC
  Administered 2018-03-18: 20 mg via ORAL
  Filled 2018-03-18: qty 1

## 2018-03-18 MED ORDER — INSULIN ASPART 100 UNIT/ML ~~LOC~~ SOLN
0.0000 [IU] | SUBCUTANEOUS | Status: DC
  Administered 2018-03-18: 5 [IU] via SUBCUTANEOUS
  Administered 2018-03-18: 3 [IU] via SUBCUTANEOUS

## 2018-03-18 MED ORDER — ROCURONIUM BROMIDE 50 MG/5ML IV SOSY
PREFILLED_SYRINGE | INTRAVENOUS | Status: DC | PRN
  Administered 2018-03-18: 50 mg via INTRAVENOUS
  Administered 2018-03-18: 10 mg via INTRAVENOUS

## 2018-03-18 MED ORDER — ONDANSETRON HCL 4 MG/2ML IJ SOLN: 4.0000 mg | INTRAMUSCULAR | Status: DC | PRN

## 2018-03-18 MED ORDER — ZOLPIDEM TARTRATE 5 MG PO TABS: 5.0000 mg | ORAL_TABLET | Freq: Every evening | ORAL | Status: DC | PRN

## 2018-03-18 MED ORDER — INDIGOTINDISULFONATE SODIUM 8 MG/ML IJ SOLN
INTRAMUSCULAR | Status: DC | PRN
  Administered 2018-03-18: 5 mg via INTRAVENOUS

## 2018-03-18 MED ORDER — DIPHENHYDRAMINE HCL 50 MG/ML IJ SOLN: 12.5000 mg | Freq: Four times a day (QID) | INTRAMUSCULAR | Status: DC | PRN

## 2018-03-18 MED ORDER — TRAMADOL HCL 50 MG PO TABS: 50.0000 mg | ORAL_TABLET | Freq: Four times a day (QID) | ORAL | Status: DC | PRN

## 2018-03-18 MED ORDER — LIDOCAINE 2% (20 MG/ML) 5 ML SYRINGE
INTRAMUSCULAR | Status: DC | PRN
  Administered 2018-03-18: 100 mg via INTRAVENOUS

## 2018-03-18 MED ORDER — PROPOFOL 10 MG/ML IV BOLUS
INTRAVENOUS | Status: AC
  Filled 2018-03-18: qty 40

## 2018-03-18 MED ORDER — INDIGOTINDISULFONATE SODIUM 8 MG/ML IJ SOLN
INTRAMUSCULAR | Status: AC
  Filled 2018-03-18: qty 5

## 2018-03-18 MED ORDER — SUCCINYLCHOLINE CHLORIDE 200 MG/10ML IV SOSY
PREFILLED_SYRINGE | INTRAVENOUS | Status: AC
  Filled 2018-03-18: qty 10

## 2018-03-18 MED ORDER — LISINOPRIL 20 MG PO TABS
20.0000 mg | ORAL_TABLET | Freq: Every day | ORAL | Status: DC
  Administered 2018-03-18 – 2018-03-19 (×2): 20 mg via ORAL
  Filled 2018-03-18 (×3): qty 1

## 2018-03-18 MED ORDER — SODIUM CHLORIDE 0.9% FLUSH: 3.0000 mL | INTRAVENOUS | Status: DC | PRN

## 2018-03-18 MED ORDER — SUGAMMADEX SODIUM 200 MG/2ML IV SOLN
INTRAVENOUS | Status: DC | PRN
  Administered 2018-03-18: 300 mg via INTRAVENOUS

## 2018-03-18 MED ORDER — ROCURONIUM BROMIDE 10 MG/ML (PF) SYRINGE
PREFILLED_SYRINGE | INTRAVENOUS | Status: AC
  Filled 2018-03-18: qty 10

## 2018-03-18 MED ORDER — STERILE WATER FOR IRRIGATION IR SOLN
Status: DC | PRN
  Administered 2018-03-18: 250 mL

## 2018-03-18 MED ORDER — POTASSIUM CHLORIDE CRYS ER 20 MEQ PO TBCR
20.0000 meq | EXTENDED_RELEASE_TABLET | Freq: Every day | ORAL | Status: DC
  Administered 2018-03-18 – 2018-03-19 (×2): 20 meq via ORAL
  Filled 2018-03-18 (×4): qty 1

## 2018-03-18 MED ORDER — MIDAZOLAM HCL 2 MG/2ML IJ SOLN: 0.5000 mg | Freq: Once | INTRAMUSCULAR | Status: DC | PRN

## 2018-03-18 MED ORDER — SODIUM CHLORIDE 0.9 % IR SOLN
Status: DC | PRN
  Administered 2018-03-18: 24000 mL via INTRAVESICAL

## 2018-03-18 MED ORDER — FENTANYL CITRATE (PF) 100 MCG/2ML IJ SOLN
INTRAMUSCULAR | Status: AC
  Filled 2018-03-18: qty 2

## 2018-03-18 MED ORDER — MEPERIDINE HCL 50 MG/ML IJ SOLN: 6.2500 mg | INTRAMUSCULAR | Status: DC | PRN

## 2018-03-18 MED ORDER — FENTANYL CITRATE (PF) 100 MCG/2ML IJ SOLN
INTRAMUSCULAR | Status: DC | PRN
  Administered 2018-03-18 (×4): 50 ug via INTRAVENOUS

## 2018-03-18 MED ORDER — PHENYLEPHRINE HCL 10 MG/ML IJ SOLN
INTRAVENOUS | Status: DC | PRN
  Administered 2018-03-18: 25 ug/min via INTRAVENOUS

## 2018-03-18 MED ORDER — ACETAMINOPHEN 325 MG PO TABS: 650.0000 mg | ORAL_TABLET | ORAL | Status: DC | PRN

## 2018-03-18 MED ORDER — PHENYLEPHRINE 40 MCG/ML (10ML) SYRINGE FOR IV PUSH (FOR BLOOD PRESSURE SUPPORT)
PREFILLED_SYRINGE | INTRAVENOUS | Status: AC
  Filled 2018-03-18: qty 10

## 2018-03-18 MED ORDER — HYDROMORPHONE HCL 1 MG/ML IJ SOLN: 0.2500 mg | INTRAMUSCULAR | Status: DC | PRN

## 2018-03-18 MED ORDER — INSULIN ASPART 100 UNIT/ML ~~LOC~~ SOLN
SUBCUTANEOUS | Status: AC
  Administered 2018-03-18: 5 [IU] via SUBCUTANEOUS
  Filled 2018-03-18: qty 1

## 2018-03-18 MED ORDER — BELLADONNA ALKALOIDS-OPIUM 16.2-60 MG RE SUPP: 1.0000 | Freq: Four times a day (QID) | RECTAL | Status: DC | PRN

## 2018-03-18 MED ORDER — SODIUM CHLORIDE 0.9 % IV SOLN: 250.0000 mL | INTRAVENOUS | Status: DC | PRN

## 2018-03-18 MED ORDER — PHENYLEPHRINE HCL 10 MG/ML IJ SOLN
INTRAMUSCULAR | Status: AC
  Filled 2018-03-18: qty 1

## 2018-03-18 MED ORDER — SUGAMMADEX SODIUM 500 MG/5ML IV SOLN
INTRAVENOUS | Status: AC
  Filled 2018-03-18: qty 5

## 2018-03-18 MED ORDER — DIPHENHYDRAMINE HCL 12.5 MG/5ML PO ELIX: 12.5000 mg | ORAL_SOLUTION | Freq: Four times a day (QID) | ORAL | Status: DC | PRN

## 2018-03-18 MED ORDER — SODIUM CHLORIDE 0.9% FLUSH: 3.0000 mL | Freq: Two times a day (BID) | INTRAVENOUS | Status: DC

## 2018-03-18 MED ORDER — MIDAZOLAM HCL 5 MG/5ML IJ SOLN
INTRAMUSCULAR | Status: DC | PRN
  Administered 2018-03-18: 2 mg via INTRAVENOUS

## 2018-03-18 MED ORDER — ONDANSETRON HCL 4 MG/2ML IJ SOLN
INTRAMUSCULAR | Status: DC | PRN
  Administered 2018-03-18: 4 mg via INTRAVENOUS

## 2018-03-18 MED ORDER — PROMETHAZINE HCL 25 MG/ML IJ SOLN: 6.2500 mg | INTRAMUSCULAR | Status: DC | PRN

## 2018-03-18 MED ORDER — INSULIN ASPART 100 UNIT/ML ~~LOC~~ SOLN
0.0000 [IU] | Freq: Three times a day (TID) | SUBCUTANEOUS | Status: DC
  Administered 2018-03-19: 3 [IU] via SUBCUTANEOUS

## 2018-03-18 MED ORDER — 0.9 % SODIUM CHLORIDE (POUR BTL) OPTIME
TOPICAL | Status: DC | PRN
  Administered 2018-03-18: 1000 mL

## 2018-03-18 MED ORDER — SODIUM CHLORIDE 0.9 % IV SOLN
2.0000 g | Freq: Once | INTRAVENOUS | Status: AC
  Administered 2018-03-18: 2 g via INTRAVENOUS
  Filled 2018-03-18: qty 20

## 2018-03-18 MED ORDER — PROPOFOL 10 MG/ML IV BOLUS
INTRAVENOUS | Status: DC | PRN
  Administered 2018-03-18: 200 mg via INTRAVENOUS

## 2018-03-18 MED ORDER — LIDOCAINE 2% (20 MG/ML) 5 ML SYRINGE
INTRAMUSCULAR | Status: AC
  Filled 2018-03-18: qty 5

## 2018-03-18 MED ORDER — SODIUM CHLORIDE 0.45 % IV SOLN
INTRAVENOUS | Status: DC
  Administered 2018-03-18: 18:00:00 via INTRAVENOUS

## 2018-03-18 MED ORDER — LACTATED RINGERS IV SOLN
INTRAVENOUS | Status: DC
  Administered 2018-03-18: 10:00:00 via INTRAVENOUS
  Administered 2018-03-18: 1000 mL via INTRAVENOUS

## 2018-03-18 MED ORDER — PHENYLEPHRINE 40 MCG/ML (10ML) SYRINGE FOR IV PUSH (FOR BLOOD PRESSURE SUPPORT)
PREFILLED_SYRINGE | INTRAVENOUS | Status: DC | PRN
  Administered 2018-03-18 (×2): 80 ug via INTRAVENOUS

## 2018-03-18 MED ORDER — DOCUSATE SODIUM 100 MG PO CAPS: 100.0000 mg | ORAL_CAPSULE | Freq: Two times a day (BID) | ORAL | 0 refills | Status: DC

## 2018-03-18 MED ORDER — ESMOLOL HCL 100 MG/10ML IV SOLN
INTRAVENOUS | Status: AC
  Filled 2018-03-18: qty 10

## 2018-03-18 MED ORDER — AMLODIPINE BESYLATE 5 MG PO TABS
5.0000 mg | ORAL_TABLET | Freq: Every day | ORAL | Status: DC
  Administered 2018-03-19: 5 mg via ORAL
  Filled 2018-03-18: qty 1

## 2018-03-18 MED ORDER — SODIUM CHLORIDE 0.9 % IR SOLN
3000.0000 mL | Status: DC
  Administered 2018-03-18 (×2): 3000 mL

## 2018-03-18 MED ORDER — DEXAMETHASONE SODIUM PHOSPHATE 10 MG/ML IJ SOLN
INTRAMUSCULAR | Status: AC
  Filled 2018-03-18: qty 1

## 2018-03-18 MED ORDER — DEXAMETHASONE SODIUM PHOSPHATE 10 MG/ML IJ SOLN
INTRAMUSCULAR | Status: DC | PRN
  Administered 2018-03-18: 8 mg via INTRAVENOUS

## 2018-03-18 MED ORDER — TRAMADOL HCL 50 MG PO TABS: 50.0000 mg | ORAL_TABLET | Freq: Four times a day (QID) | ORAL | 0 refills | Status: DC | PRN

## 2018-03-18 MED ORDER — DOCUSATE SODIUM 100 MG PO CAPS
100.0000 mg | ORAL_CAPSULE | Freq: Two times a day (BID) | ORAL | Status: DC
  Administered 2018-03-18 – 2018-03-19 (×3): 100 mg via ORAL
  Filled 2018-03-18 (×4): qty 1

## 2018-03-18 SURGICAL SUPPLY — 17 items
BAG URINE DRAINAGE (UROLOGICAL SUPPLIES) ×2 IMPLANT
BAG URO CATCHER STRL LF (MISCELLANEOUS) ×2 IMPLANT
CATH HEMA 3WAY 30CC 22FR COUDE (CATHETERS) ×2 IMPLANT
COVER WAND RF STERILE (DRAPES) IMPLANT
ELECT REM PT RETURN 15FT ADLT (MISCELLANEOUS) ×2 IMPLANT
FIBER LASER FLEXIVA 550 (UROLOGICAL SUPPLIES) ×2 IMPLANT
GLOVE BIOGEL M STRL SZ7.5 (GLOVE) ×2 IMPLANT
GOWN STRL REUS W/TWL LRG LVL3 (GOWN DISPOSABLE) ×2 IMPLANT
GOWN STRL REUS W/TWL XL LVL3 (GOWN DISPOSABLE) ×2 IMPLANT
HOLDER FOLEY CATH W/STRAP (MISCELLANEOUS) ×2 IMPLANT
LOOP CUT BIPOLAR 24F LRG (ELECTROSURGICAL) ×2 IMPLANT
MANIFOLD NEPTUNE II (INSTRUMENTS) ×2 IMPLANT
PACK CYSTO (CUSTOM PROCEDURE TRAY) ×2 IMPLANT
SET ASPIRATION TUBING (TUBING) IMPLANT
SYRINGE IRR TOOMEY STRL 70CC (SYRINGE) ×2 IMPLANT
TUBING CONNECTING 10 (TUBING) ×2 IMPLANT
TUBING UROLOGY SET (TUBING) ×2 IMPLANT

## 2018-03-18 NOTE — Discharge Instructions (Signed)

## 2018-03-18 NOTE — Anesthesia Postprocedure Evaluation (Signed)
Anesthesia Post Note  Patient: Joseph Clay  Procedure(s) Performed: TRANSURETHRAL RESECTION OF THE PROSTATE (TURP), CYSTOLITHALOPAXY WITH HOLMIUM LASER, CYSTOSCOPY (N/A )     Patient location during evaluation: PACU Anesthesia Type: General Level of consciousness: awake and alert, oriented and patient cooperative Pain management: pain level controlled Vital Signs Assessment: post-procedure vital signs reviewed and stable Respiratory status: spontaneous breathing, nonlabored ventilation and respiratory function stable Cardiovascular status: blood pressure returned to baseline and stable Postop Assessment: no apparent nausea or vomiting Anesthetic complications: no    Last Vitals:  Vitals:   03/18/18 1400 03/18/18 1501  BP: (!) 157/78 (!) 154/71  Pulse: 82 81  Resp: 12 14  Temp:  36.4 C  SpO2: 100% 98%    Last Pain:  Vitals:   03/18/18 1501  TempSrc:   PainSc: 0-No pain                 Wanna Gully,E. Emrys Mckamie

## 2018-03-18 NOTE — Anesthesia Procedure Notes (Signed)
Procedure Name: Intubation Date/Time: 03/18/2018 7:32 AM Performed by: Maxwell Caul, CRNA Pre-anesthesia Checklist: Patient identified, Emergency Drugs available, Suction available and Patient being monitored Patient Re-evaluated:Patient Re-evaluated prior to induction Oxygen Delivery Method: Circle system utilized Preoxygenation: Pre-oxygenation with 100% oxygen Induction Type: IV induction Ventilation: Mask ventilation without difficulty Laryngoscope Size: Glidescope and 4 Grade View: Grade I Tube type: Oral Tube size: 7.5 mm Number of attempts: 1 Airway Equipment and Method: Stylet Placement Confirmation: ETT inserted through vocal cords under direct vision,  positive ETCO2 and breath sounds checked- equal and bilateral Secured at: 21 cm Tube secured with: Tape Dental Injury: Teeth and Oropharynx as per pre-operative assessment  Difficulty Due To: Difficulty was anticipated, Difficult Airway- due to limited oral opening and Difficult Airway- due to dentition Comments: DL X 1 with MAC 4 and poor view due to limited mouth opening and dentition. Glidescope #4 utilized with Grade 1 view and ETT placed with ease.

## 2018-03-18 NOTE — Transfer of Care (Signed)
Immediate Anesthesia Transfer of Care Note  Patient: Joseph Clay  Procedure(s) Performed: TRANSURETHRAL RESECTION OF THE PROSTATE (TURP), CYSTOLITHALOPAXY WITH HOLMIUM LASER, CYSTOSCOPY (N/A )  Patient Location: PACU  Anesthesia Type:General  Level of Consciousness: awake, alert  and oriented  Airway & Oxygen Therapy: Patient Spontanous Breathing and Patient connected to face mask oxygen  Post-op Assessment: Report given to RN and Post -op Vital signs reviewed and stable  Post vital signs: Reviewed and stable  Last Vitals:  Vitals Value Taken Time  BP 159/82 03/18/2018  9:46 AM  Temp    Pulse 84 03/18/2018  9:48 AM  Resp 0 03/18/2018  9:48 AM  SpO2 97 % 03/18/2018  9:48 AM  Vitals shown include unvalidated device data.  Last Pain:  Vitals:   03/18/18 0946  TempSrc:   PainSc: (P) Asleep      Patients Stated Pain Goal: 4 (03/18/18 [id number])  Complications: No apparent anesthesia complications

## 2018-03-18 NOTE — Interval H&P Note (Signed)
History and Physical Interval Note:  03/18/2018 6:58 AM  Joseph CavaPeter A Dumm  has presented today for surgery, with the diagnosis of BENIGN PROSTATIC HYPERPLASIA  The various methods of treatment have been discussed with the patient and family. After consideration of risks, benefits and other options for treatment, the patient has consented to  Procedure(s): TRANSURETHRAL RESECTION OF THE PROSTATE (TURP), CYSTOLITHALOPAXY, CYSTOSCOPY (N/A) as a surgical intervention .  The patient's history has been reviewed, patient examined, no change in status, stable for surgery.  I have reviewed the patient's chart and labs.  Questions were answered to the patient's satisfaction.     Ilani Otterson,LES

## 2018-03-18 NOTE — Progress Notes (Signed)
Patient ID: Joseph Clay A Corker, male   DOB: 05/06/1950, 67 y.o.   MRN: [id number]  Post-op note  Subjective: The patient is doing well.  No complaints.  Objective: Vital signs in last 24 hours: Temp:  [97.6 F (36.4 C)-98.3 F (36.8 C)] 97.6 F (36.4 C) (12/05 1516) Pulse Rate:  [79-87] 79 (12/05 1527) Resp:  [11-30] 24 (12/05 1516) BP: (130-171)/(71-92) 168/85 (12/05 1527) SpO2:  [96 %-100 %] 98 % (12/05 1516) Weight:  [128.4 kg] 128.4 kg (12/05 0638)  Intake/Output from previous day: No intake/output data recorded. Intake/Output this shift: Total I/O In: 3900 [I.V.:1000; Other:2900] Out: 5225 [Urine:5125; Blood:100]  Physical Exam:  General: Alert and oriented. GU: Urine light pink on minimal CBI  Assessment/Plan: POD#0   1) Continue to monitor, titrate CBI to off   Rolly SalterLester S. Harlin Mazzoni, Montez HagemanJr. MD   LOS: 0 days   Camiyah Friberg,LES 03/18/2018, 3:55 PM

## 2018-03-18 NOTE — Op Note (Addendum)
Preoperative diagnosis: 1. Bladder outlet obstruction secondary to BPH, bladder calculi (largest 2 cm)  Postoperative diagnosis:  1. Bladder outlet obstruction secondary to BPH, bladder calculi (largest 2 cm)  Procedure:  1. Cystoscopy 2. Transurethral resection of the prostate 3. Laser lithotripsy of bladder stones and cystolithalopaxy  Surgeon: Moody BruinsLester S. Greyden Besecker, Jr. M.D.  Anesthesia: General  Complications: None  EBL: Minimal  Specimens: 1. Bladder calculi 2. Prostate chips  Disposition of specimens: Pathology  Indication: Joseph Clay is a patient with bladder outlet obstruction secondary to benign prostatic hyperplasia with bladder calculi. After reviewing the management options for treatment, he elected to proceed with the above surgical procedure(s). We have discussed the potential benefits and risks of the procedure, side effects of the proposed treatment, the likelihood of the patient achieving the goals of the procedure, and any potential problems that might occur during the procedure or recuperation. Informed consent has been obtained.  Description of procedure:  The patient was taken to the operating room and general anesthesia was induced.  The patient was placed in the dorsal lithotomy position, prepped and draped in the usual sterile fashion, and preoperative antibiotics were administered. A preoperative time-out was performed.   Cystourethroscopy was performed.  The patient's urethra was examined and demonstrated bilobar prostatic hypertrophy with regrowth of lateral lobe tissue s/p TURP in the distant past.  There were two large bladder stones noted.  The bladder was then systematically examined in its entirety.  No tumors or other abnormalities were identified.  Due to the large size of the stones, the 550 micron holmium laser fiber was used to fragment the stones on settings of 1.0 J and 8 Hz.  Once adequate fragmentation was performed, the stones were removed  with a Toomey syringe.  Remaining fragments were then broken down further with a lithotrite and removed.  The prostate adenoma was then resected utilizing loop cautery resection with the bipolar cutting loop.  The remaining prostate adenoma from the bladder neck back to the verumontanum was resected beginning at the six o'clock position and then extended to include the right and left lobes of the prostate and anterior prostate. Care was taken not to resect distal to the verumontanum.  Hemostasis was then achieved with the cautery and the bladder was emptied and reinspected with no significant bleeding noted at the end of the procedure.    A 3 way catheter was then placed into the bladder and placed on continuous bladder irrigation.  The patient appeared to tolerate the procedure well and without complications.  The patient was able to be awakened and transferred to the recovery unit in satisfactory condition.

## 2018-03-19 ENCOUNTER — Other Ambulatory Visit: Payer: Self-pay

## 2018-03-19 ENCOUNTER — Encounter (HOSPITAL_COMMUNITY): Payer: Self-pay

## 2018-03-19 DIAGNOSIS — R310 Gross hematuria: Secondary | ICD-10-CM | POA: Diagnosis not present

## 2018-03-19 DIAGNOSIS — E119 Type 2 diabetes mellitus without complications: Secondary | ICD-10-CM | POA: Diagnosis not present

## 2018-03-19 DIAGNOSIS — Z7982 Long term (current) use of aspirin: Secondary | ICD-10-CM | POA: Diagnosis not present

## 2018-03-19 DIAGNOSIS — Z87891 Personal history of nicotine dependence: Secondary | ICD-10-CM | POA: Diagnosis not present

## 2018-03-19 DIAGNOSIS — I10 Essential (primary) hypertension: Secondary | ICD-10-CM | POA: Diagnosis not present

## 2018-03-19 DIAGNOSIS — Z7984 Long term (current) use of oral hypoglycemic drugs: Secondary | ICD-10-CM | POA: Diagnosis not present

## 2018-03-19 DIAGNOSIS — N401 Enlarged prostate with lower urinary tract symptoms: Secondary | ICD-10-CM | POA: Diagnosis not present

## 2018-03-19 DIAGNOSIS — Z79899 Other long term (current) drug therapy: Secondary | ICD-10-CM | POA: Diagnosis not present

## 2018-03-19 DIAGNOSIS — N138 Other obstructive and reflux uropathy: Secondary | ICD-10-CM | POA: Diagnosis not present

## 2018-03-19 DIAGNOSIS — E7800 Pure hypercholesterolemia, unspecified: Secondary | ICD-10-CM | POA: Diagnosis not present

## 2018-03-19 DIAGNOSIS — G4730 Sleep apnea, unspecified: Secondary | ICD-10-CM | POA: Diagnosis not present

## 2018-03-19 DIAGNOSIS — Z8249 Family history of ischemic heart disease and other diseases of the circulatory system: Secondary | ICD-10-CM | POA: Diagnosis not present

## 2018-03-19 DIAGNOSIS — N210 Calculus in bladder: Secondary | ICD-10-CM | POA: Diagnosis not present

## 2018-03-19 DIAGNOSIS — Z6836 Body mass index (BMI) 36.0-36.9, adult: Secondary | ICD-10-CM | POA: Diagnosis not present

## 2018-03-19 LAB — GLUCOSE, CAPILLARY: Glucose-Capillary: 176 mg/dL — ABNORMAL HIGH (ref 70–99)

## 2018-03-19 NOTE — Discharge Summary (Signed)
Date of admission: 03/18/2018  Date of discharge: 03/19/2018  Admission diagnosis: Bladder calculi and bladder outlet obstruction secondary to BPH  Discharge diagnosis:  Bladder calculi and bladder outlet obstruction secondary to BPH  Secondary diagnoses:  Hypertension and diabetes  History and Physical: For full details, please see admission history and physical. Briefly, Joseph Clay is a 67 y.o. year old patient with  Hematuria related to bladder calculi and BPH.  After discussing options, he elected to proceed with endoscopic removal of his bladder calculi and repeat TURP for prostatic bleeding.  Hospital Course:   He was taken to the operating room on 03/18/2018 and underwent laser fragmentation and cystolitholapaxy of his bladder stones.  He also underwent transurethral resection of his prostate for regrowth tissue/bleeding.  He tolerated this procedure well and without complications.  He was placed on continuous bladder irrigation overnight.  His catheter was removed on postoperative day 1. He was unfortunately unable to void.  Initial attempts to place a catheter were unsuccessful and he required cystoscopy with placement of an 37 French [place] tip catheter over a wire.  He was discharged home with this catheter in place.  Laboratory values: No results for input(s): HGB, HCT in the last 72 hours. No results for input(s): CREATININE in the last 72 hours.  Disposition: Home  Discharge instruction: The patient was instructed to be ambulatory but told to refrain from heavy lifting, strenuous activity, or driving.  Discharge medications:  Allergies as of 03/19/2018   No Known Allergies     Medication List    STOP taking these medications   aspirin EC 81 MG tablet   ibuprofen 800 MG tablet Commonly known as:  ADVIL,MOTRIN     TAKE these medications   amLODipine 5 MG tablet Commonly known as:  NORVASC Take 5 mg by mouth daily.   calcium carbonate 600 MG Tabs  tablet Commonly known as:  OS-CAL Take 600 mg by mouth every evening.   docusate sodium 100 MG capsule Commonly known as:  COLACE Take 1 capsule (100 mg total) by mouth 2 (two) times daily. What changed:  You were already taking a medication with the same name, and this prescription was added. Make sure you understand how and when to take each.   docusate sodium 50 MG capsule Commonly known as:  COLACE Take 50 mg by mouth 2 (two) times daily. What changed:  Another medication with the same name was added. Make sure you understand how and when to take each.   furosemide 40 MG tablet Commonly known as:  LASIX Take 40 mg by mouth daily.   lisinopril 20 MG tablet Commonly known as:  PRINIVIL,ZESTRIL Take 20 mg by mouth daily.   metFORMIN 1000 MG tablet Commonly known as:  GLUCOPHAGE Take 1,000 mg by mouth 2 (two) times daily.   potassium chloride SA 20 MEQ tablet Commonly known as:  K-DUR,KLOR-CON Take 20 mEq by mouth daily.   pravastatin 40 MG tablet Commonly known as:  PRAVACHOL Take 20 mg by mouth every evening.   traMADol 50 MG tablet Commonly known as:  ULTRAM Take 1-2 tablets (50-100 mg total) by mouth every 6 (six) hours as needed (pain).       Followup:  Follow-up Information    Heloise Purpura, MD.   Specialty:  Urology Why:  04/21/17 at 12:30 PM Contact information: [address] [place] [address] [phone]            He will plan to follow  up next week for a voiding trial.

## 2018-03-19 NOTE — Progress Notes (Signed)
Patient ID: Joseph Clay, male   DOB: 25-Dec-1950, 68 y.o.   MRN: [id number]   He was unable to void today and attempts by the nursing staff to place a catheter were unsuccessful.  I therefore attempted placement of a 26 French coude catheter under sterile conditions and also met resistance near the bladder neck.  For this reason, he underwent flexible cystoscopy and an 37 French [place] tip catheter was placed over a wire.  This resulted in approximately 600 cc of mostly clear urine.

## 2018-03-19 NOTE — Progress Notes (Signed)
Patient ID: Valma Cavaeter A Salome, male   DOB: 05/12/1950, 67 y.o.   MRN: [id number]  1 Day Post-Op Subjective: Pt without complaints.  CBI titrated down overnight.  Objective: Vital signs in last 24 hours: Temp:  [97.6 F (36.4 C)-99.3 F (37.4 C)] 98.3 F (36.8 C) (12/06 0549) Pulse Rate:  [78-86] 78 (12/06 0549) Resp:  [11-30] 20 (12/06 0549) BP: (130-171)/(71-92) 148/71 (12/06 0549) SpO2:  [93 %-100 %] 99 % (12/06 0549)  Intake/Output from previous day: 12/05 0701 - 12/06 0700 In: 7685.1 [I.V.:1785.1] Out: [id number] [Urine:11925; Blood:100] Intake/Output this shift: No intake/output data recorded.  Physical Exam:  General: Alert and oriented Abdomen: Soft, ND GU: Urine clear with CBI off   Assessment/Plan: POD # 1 s/p TURP - His catheter was removed this morning - Voiding trial and d/c home later today   LOS: 0 days   Muntaha Vermette,LES 03/19/2018, 7:39 AM

## 2018-03-19 NOTE — Care Management Obs Status (Signed)
MEDICARE OBSERVATION STATUS NOTIFICATION   Patient Details  Name: Joseph Clay MRN: [id number] Date of Birth: 11/07/1950   Medicare Observation Status Notification Given:  Yes    MahabirOlegario Messier, Salvador Bigbee, RN 03/19/2018, 10:40 AM

## 2018-03-19 NOTE — Progress Notes (Signed)
Patient had foley catheter removed this morning by Dr. Borden and RN was instructed to do a string of bottles. Patient had only voided about 60ml over the course of 4 hours when he started complaining of intense abdominal pain. Patient's bladder scan volume showed 600cc. RN paged Dr. Borden to let him know what all was going on. Dr. Borden instructed RN to try to place a 16F or 18F Coude Catheter. 2 RN's tried both a 16F and an 18F coude catheter and were unsuccessful. RN paged Dr. Borden to let him know that neither RN could get this catheter placed. Dr. Borden instructed RN to put catheter kit, 18F coude catheter, and catheter cart at bedside and that he would come up to place catheter. Patient is clearly in a lot of distress and pain due to the pressure in the bladder. RN asked if he would like any medication to help but he said nothing would help with the pain that he was having. RN will continue to monitor patient.  

## 2018-03-23 DIAGNOSIS — R338 Other retention of urine: Secondary | ICD-10-CM | POA: Diagnosis not present

## 2018-03-26 DIAGNOSIS — R338 Other retention of urine: Secondary | ICD-10-CM | POA: Diagnosis not present

## 2018-03-28 ENCOUNTER — Encounter (HOSPITAL_COMMUNITY): Payer: Self-pay

## 2018-03-28 ENCOUNTER — Emergency Department (HOSPITAL_COMMUNITY)
Admission: EM | Admit: 2018-03-28 | Discharge: 2018-03-28 | Disposition: A | Discharge status: Home / Self Care | Payer: Medicare Other | Attending: Emergency Medicine | Admitting: Emergency Medicine

## 2018-03-28 DIAGNOSIS — I129 Hypertensive chronic kidney disease with stage 1 through stage 4 chronic kidney disease, or unspecified chronic kidney disease: Secondary | ICD-10-CM | POA: Diagnosis not present

## 2018-03-28 DIAGNOSIS — Z79899 Other long term (current) drug therapy: Secondary | ICD-10-CM | POA: Insufficient documentation

## 2018-03-28 DIAGNOSIS — Z7902 Long term (current) use of antithrombotics/antiplatelets: Secondary | ICD-10-CM | POA: Insufficient documentation

## 2018-03-28 DIAGNOSIS — R339 Retention of urine, unspecified: Secondary | ICD-10-CM | POA: Diagnosis present

## 2018-03-28 DIAGNOSIS — Z7984 Long term (current) use of oral hypoglycemic drugs: Secondary | ICD-10-CM | POA: Diagnosis not present

## 2018-03-28 DIAGNOSIS — E1122 Type 2 diabetes mellitus with diabetic chronic kidney disease: Secondary | ICD-10-CM | POA: Insufficient documentation

## 2018-03-28 DIAGNOSIS — N189 Chronic kidney disease, unspecified: Secondary | ICD-10-CM | POA: Insufficient documentation

## 2018-03-28 DIAGNOSIS — Z87891 Personal history of nicotine dependence: Secondary | ICD-10-CM | POA: Insufficient documentation

## 2018-03-28 DIAGNOSIS — N3000 Acute cystitis without hematuria: Secondary | ICD-10-CM | POA: Diagnosis not present

## 2018-03-28 LAB — URINALYSIS, ROUTINE W REFLEX MICROSCOPIC
BILIRUBIN URINE: NEGATIVE
GLUCOSE, UA: NEGATIVE mg/dL
Ketones, ur: NEGATIVE mg/dL
Nitrite: NEGATIVE
Protein, ur: 30 mg/dL — AB
RBC / HPF: >50 RBC/hpf — ABNORMAL HIGH (ref 0–5)
Specific Gravity, Urine: 1.008 (ref 1.005–1.030)
WBC, UA: >50 WBC/hpf — ABNORMAL HIGH (ref 0–5)
pH: 6 (ref 5.0–8.0)

## 2018-03-28 MED ORDER — CEPHALEXIN 500 MG PO CAPS: 500.0000 mg | ORAL_CAPSULE | Freq: Once | ORAL | Status: DC

## 2018-03-28 MED ORDER — CEPHALEXIN 500 MG PO CAPS
500.0000 mg | ORAL_CAPSULE | Freq: Once | ORAL | Status: AC
  Administered 2018-03-28: 500 mg via ORAL
  Filled 2018-03-28: qty 1

## 2018-03-28 MED ORDER — CEPHALEXIN 500 MG PO CAPS: 500.0000 mg | ORAL_CAPSULE | Freq: Three times a day (TID) | ORAL | 0 refills | Status: DC

## 2018-03-28 NOTE — ED Provider Notes (Signed)
[facility]-EMERGENCY DEPT Provider Note   CSN: [id number] Arrival date & time: 03/28/18  1043     History   Chief Complaint Chief Complaint  Patient presents with  . Urinary Retention    HPI Joseph Clay is a 67 y.o. male.  HPI 67 year old male status post recent TURP presents the emergency department with complaints of urinary frequency and intermittent urinary stream issues.  He had a catheter removed from the urology office on Friday.  He states that his urination was pretty good on Saturday but then today has had some more intermittent difficulty.  He states urinary frequency with small volume urine.  No fevers or chills.  No nausea or vomiting.  Symptoms are mild in severity.  He contacted the urology office who recommended he come to the ER for further evaluation.   Past Medical History:  Diagnosis Date  . Arthritis   . Bronchitis    couple of months ago  . Chronic back pain    bulding disc  . Chronic kidney disease    bladder outlet obstruction-  RECENT HOSPITALIZATION WITH ACUTE RENAL FAILURE 2/13-      recent scans in EPIC from 2/13-  . Diabetes mellitus    takes Metformin bid  . Hx of colonic polyps   . Hyperlipidemia    takes Pravastatin daily  . Hypertension    EKG, Chest 2/13 EPIC- followed by Dr Roseanne Reno, labs 05/31/11  epic  . Joint pain    right shoulder and right knee  . Sleep apnea    CPAP-sleep study done 2months ago/ UNABLE TO LOCATE REPORT /  states SEVERE- doesnt know settings    Patient Active Problem List   Diagnosis Date Noted  . Benign prostatic hyperplasia 03/18/2018  . Sepsis (HCC) 05/23/2015  . Bacteremia 05/23/2015  . Cholangiolitis 05/23/2015  . Choledocholithiasis 05/23/2015  . Lactic acidosis 05/15/2015  . Hyperbilirubinemia 05/15/2015  . Transaminitis 05/15/2015  . ARF (acute renal failure) (HCC) 05/31/2011  . Bladder outlet obstruction 05/31/2011  . HTN (hypertension) 05/31/2011  . DM type 2  (diabetes mellitus, type 2) (HCC) 05/31/2011    Past Surgical History:  Procedure Laterality Date  . CHOLECYSTECTOMY N/A 05/21/2015   Procedure: LAPAROSCOPIC CHOLECYSTECTOMY WITH INTRAOPERATIVE CHOLANGIOGRAM, BIOPSY RIGHT LOBE OF LIVER;  Surgeon: Ovidio Kin, MD;  Location: [facility];  Service: General;  Laterality: N/A;  . colonscopy    . CYSTOSCOPY  06/16/2011   Procedure: CYSTOSCOPY;  Surgeon: Crecencio Mc, MD;  Location: [facility];  Service: Urology;  Laterality: N/A;       . ERCP N/A 05/17/2015   Procedure: ENDOSCOPIC RETROGRADE CHOLANGIOPANCREATOGRAPHY (ERCP);  Surgeon: Vida Rigger, MD;  Location: [facility];  Service: Endoscopy;  Laterality: N/A;  . RT shoulder surgery     bone spur,scrape rotor cuff  . SHOULDER ARTHROSCOPY     right   05/23/11/   [place]  . TONSILLECTOMY     as a child  . TRANSURETHRAL RESECTION OF PROSTATE  06/16/2011   Procedure: TRANSURETHRAL RESECTION OF THE PROSTATE (TURP);  Surgeon: Crecencio Mc, MD;  Location: [facility];  Service: Urology;  Laterality: N/A;  . TRANSURETHRAL RESECTION OF PROSTATE     03-18-18   . TRANSURETHRAL RESECTION OF PROSTATE N/A 03/18/2018   Procedure: TRANSURETHRAL RESECTION OF THE PROSTATE (TURP), CYSTOLITHALOPAXY WITH HOLMIUM LASER, CYSTOSCOPY;  Surgeon: Heloise Purpura, MD;  Location: [facility];  Service: Urology;  Laterality: N/A;        Home Medications  Prior to Admission medications   Medication Sig Start Date End Date Taking? Authorizing Provider  amLODipine (NORVASC) 5 MG tablet Take 5 mg by mouth daily.    Yes [provider]  calcium carbonate (OS-CAL) 600 MG TABS Take 600 mg by mouth every evening.    Yes [provider]  docusate sodium (COLACE) 100 MG capsule Take 1 capsule (100 mg total) by mouth 2 (two) times daily. 03/18/18  Yes Heloise PurpuraBorden, Lester, MD  furosemide (LASIX) 40 MG tablet Take 40 mg by mouth daily.    Yes [provider]  lisinopril (PRINIVIL,ZESTRIL) 20 MG tablet Take 20 mg by mouth daily.    Yes [provider]  metFORMIN (GLUCOPHAGE) 1000 MG tablet Take 1,000 mg by mouth 2 (two) times daily. 05/05/15  Yes [provider]  potassium chloride SA (K-DUR,KLOR-CON) 20 MEQ tablet Take 20 mEq by mouth daily.    Yes [provider]  pravastatin (PRAVACHOL) 40 MG tablet Take 20 mg by mouth every evening.    Yes [provider]  cephALEXin (KEFLEX) 500 MG capsule Take 1 capsule (500 mg total) by mouth 3 (three) times daily. 03/28/18   Azalia Bilisampos, Mirca Yale, MD  traMADol (ULTRAM) 50 MG tablet Take 1-2 tablets (50-100 mg total) by mouth every 6 (six) hours as needed (pain). Patient not taking: Reported on 03/28/2018 03/18/18   Heloise PurpuraBorden, Lester, MD    Family History Family History  Problem Relation Age of Onset  . Anesthesia problems Neg Hx   . Hypotension Neg Hx   . Malignant hyperthermia Neg Hx   . Pseudochol deficiency Neg Hx     Social History Social History   Tobacco Use  . Smoking status: Former Games developermoker  . Smokeless tobacco: Never Used  . Tobacco comment: quit 20+yrs ago  Substance Use Topics  . Alcohol use: No    Comment: occasionally   . Drug use: No     Allergies   Patient has no known allergies.   Review of Systems Review of Systems  All other systems reviewed and are negative.    Physical Exam Updated Vital Signs BP 123/73   Pulse 74   Temp (!) 97.4 F (36.3 C) Comment: patient drinking water  Resp 18   SpO2 99%   Physical Exam Vitals signs and nursing note reviewed.  Constitutional:      Appearance: He is well-developed.  HENT:     Head: Normocephalic.  Neck:     Musculoskeletal: Normal range of motion.  Pulmonary:     Effort: Pulmonary effort is normal.  Abdominal:     General: There is no distension.     Tenderness: There is no abdominal tenderness.  Genitourinary:    Penis: Normal.   Musculoskeletal: Normal range of motion.  Neurological:     Mental Status: He is alert and oriented to person, place, and time.        ED Treatments / Results  Labs (all labs ordered are listed, but only abnormal results are displayed) Labs Reviewed  URINALYSIS, ROUTINE W REFLEX MICROSCOPIC - Abnormal; Notable for the following components:      Result Value   Hgb urine dipstick LARGE (*)    Protein, ur 30 (*)    Leukocytes, UA LARGE (*)    RBC / HPF >50 (*)    WBC, UA >50 (*)    Bacteria, UA RARE (*)    All other components within normal limits  URINE CULTURE    EKG None  Radiology  No results found.  Procedures Procedures (including critical care time)  Medications Ordered in ED Medications  cephALEXin (KEFLEX) capsule 500 mg (500 mg Oral Given 03/28/18 1454)     Initial Impression / Assessment and Plan / ED Course  I have reviewed the triage vital signs and the nursing notes.  Pertinent labs & imaging results that were available during my care of the patient were reviewed by me and considered in my medical decision making (see chart for details).     Post void residual of 0.  Patient does have some white blood cells and leukocytes in his urine.  Urine culture sent.  Patient will be treated for possible UTI as he was catheterized several times intermittently after his surgical procedure.  He does not need to go home with a catheter.  Home with Keflex.  Urine culture sent.  Overall well-appearing.  Nontoxic.  Urology follow-up.   Final Clinical Impressions(s) / ED Diagnoses   Final diagnoses:  Acute cystitis without hematuria    ED Discharge Orders         Ordered    cephALEXin (KEFLEX) 500 MG capsule  3 times daily     03/28/18 1448           Azalia Bilis, MD 03/28/18 1458

## 2018-03-28 NOTE — ED Notes (Signed)
Urine Culture sent to lab with Urinalysis.

## 2018-03-28 NOTE — ED Triage Notes (Addendum)
[facility] Patient had a procedure on the 5th. Patient was able to urinate and sent home. Patient was able to urinate on and off staturday and began to not urinate like normal. Patient last urinated a small cup full about 20 min ago. Patient was told to come to ED to check urine in bladder if not urinating like normal.   Denies pain or pressure. No burning on urination.  A/Ox4 Ambulatory in triage.

## 2018-03-28 NOTE — ED Notes (Signed)
Patient urinated at bedside in Bedside urinal without pain. No foul odor or sediment noted in urine.

## 2018-03-31 LAB — URINE CULTURE: Culture: NO GROWTH

## 2018-04-21 DIAGNOSIS — N401 Enlarged prostate with lower urinary tract symptoms: Secondary | ICD-10-CM | POA: Diagnosis not present

## 2018-04-21 DIAGNOSIS — R3912 Poor urinary stream: Secondary | ICD-10-CM | POA: Diagnosis not present

## 2018-05-18 DIAGNOSIS — E782 Mixed hyperlipidemia: Secondary | ICD-10-CM | POA: Diagnosis not present

## 2018-05-18 DIAGNOSIS — I10 Essential (primary) hypertension: Secondary | ICD-10-CM | POA: Diagnosis not present

## 2018-05-18 DIAGNOSIS — G4733 Obstructive sleep apnea (adult) (pediatric): Secondary | ICD-10-CM | POA: Diagnosis not present

## 2018-05-18 DIAGNOSIS — E1129 Type 2 diabetes mellitus with other diabetic kidney complication: Secondary | ICD-10-CM | POA: Diagnosis not present

## 2018-06-11 DIAGNOSIS — G4733 Obstructive sleep apnea (adult) (pediatric): Secondary | ICD-10-CM | POA: Diagnosis not present

## 2018-09-09 DIAGNOSIS — G4733 Obstructive sleep apnea (adult) (pediatric): Secondary | ICD-10-CM | POA: Diagnosis not present

## 2018-09-28 DIAGNOSIS — H524 Presbyopia: Secondary | ICD-10-CM | POA: Diagnosis not present

## 2018-10-28 DIAGNOSIS — Z1389 Encounter for screening for other disorder: Secondary | ICD-10-CM | POA: Diagnosis not present

## 2018-10-28 DIAGNOSIS — Z0000 Encounter for general adult medical examination without abnormal findings: Secondary | ICD-10-CM | POA: Diagnosis not present

## 2018-10-29 DIAGNOSIS — N401 Enlarged prostate with lower urinary tract symptoms: Secondary | ICD-10-CM | POA: Diagnosis not present

## 2018-10-29 DIAGNOSIS — R3912 Poor urinary stream: Secondary | ICD-10-CM | POA: Diagnosis not present

## 2018-12-06 ENCOUNTER — Other Ambulatory Visit: Payer: Self-pay | Admitting: Internal Medicine

## 2018-12-06 ENCOUNTER — Ambulatory Visit
Admission: RE | Admit: 2018-12-06 | Discharge: 2018-12-06 | Disposition: A | Discharge status: Home / Self Care | Payer: Medicare Other | Source: Ambulatory Visit | Attending: Internal Medicine | Admitting: Internal Medicine

## 2018-12-06 DIAGNOSIS — E781 Pure hyperglyceridemia: Secondary | ICD-10-CM | POA: Diagnosis not present

## 2018-12-06 DIAGNOSIS — M5489 Other dorsalgia: Secondary | ICD-10-CM

## 2018-12-06 DIAGNOSIS — E1169 Type 2 diabetes mellitus with other specified complication: Secondary | ICD-10-CM | POA: Diagnosis not present

## 2018-12-06 DIAGNOSIS — I10 Essential (primary) hypertension: Secondary | ICD-10-CM | POA: Diagnosis not present

## 2018-12-06 DIAGNOSIS — M47816 Spondylosis without myelopathy or radiculopathy, lumbar region: Secondary | ICD-10-CM | POA: Diagnosis not present

## 2018-12-06 DIAGNOSIS — E782 Mixed hyperlipidemia: Secondary | ICD-10-CM | POA: Diagnosis not present

## 2018-12-08 DIAGNOSIS — G4733 Obstructive sleep apnea (adult) (pediatric): Secondary | ICD-10-CM | POA: Diagnosis not present

## 2019-01-19 DIAGNOSIS — Z23 Encounter for immunization: Secondary | ICD-10-CM | POA: Diagnosis not present

## 2019-03-08 DIAGNOSIS — G4733 Obstructive sleep apnea (adult) (pediatric): Secondary | ICD-10-CM | POA: Diagnosis not present

## 2019-03-11 DIAGNOSIS — Z20828 Contact with and (suspected) exposure to other viral communicable diseases: Secondary | ICD-10-CM | POA: Diagnosis not present

## 2019-03-29 DIAGNOSIS — I10 Essential (primary) hypertension: Secondary | ICD-10-CM | POA: Diagnosis not present

## 2019-03-29 DIAGNOSIS — E0869 Diabetes mellitus due to underlying condition with other specified complication: Secondary | ICD-10-CM | POA: Diagnosis not present

## 2019-05-11 ENCOUNTER — Ambulatory Visit: Payer: Medicare Other

## 2019-05-16 ENCOUNTER — Telehealth: Payer: Self-pay

## 2019-05-16 NOTE — Telephone Encounter (Signed)
Pt called to cancel covid vaccine scheduled for 2/5.   Will send  Message to nurse triage.   Joycelyn Rua Hopkins

## 2019-05-20 ENCOUNTER — Ambulatory Visit: Payer: Medicare Other

## 2019-06-01 ENCOUNTER — Ambulatory Visit: Payer: Medicare Other

## 2019-06-07 DIAGNOSIS — G4733 Obstructive sleep apnea (adult) (pediatric): Secondary | ICD-10-CM | POA: Diagnosis not present

## 2019-07-04 DIAGNOSIS — M9901 Segmental and somatic dysfunction of cervical region: Secondary | ICD-10-CM | POA: Diagnosis not present

## 2019-07-04 DIAGNOSIS — M9903 Segmental and somatic dysfunction of lumbar region: Secondary | ICD-10-CM | POA: Diagnosis not present

## 2019-07-04 DIAGNOSIS — M9902 Segmental and somatic dysfunction of thoracic region: Secondary | ICD-10-CM | POA: Diagnosis not present

## 2019-07-04 DIAGNOSIS — M9905 Segmental and somatic dysfunction of pelvic region: Secondary | ICD-10-CM | POA: Diagnosis not present

## 2019-07-08 DIAGNOSIS — M9905 Segmental and somatic dysfunction of pelvic region: Secondary | ICD-10-CM | POA: Diagnosis not present

## 2019-07-08 DIAGNOSIS — M9901 Segmental and somatic dysfunction of cervical region: Secondary | ICD-10-CM | POA: Diagnosis not present

## 2019-07-08 DIAGNOSIS — M9903 Segmental and somatic dysfunction of lumbar region: Secondary | ICD-10-CM | POA: Diagnosis not present

## 2019-07-08 DIAGNOSIS — M9902 Segmental and somatic dysfunction of thoracic region: Secondary | ICD-10-CM | POA: Diagnosis not present

## 2019-07-13 DIAGNOSIS — M9905 Segmental and somatic dysfunction of pelvic region: Secondary | ICD-10-CM | POA: Diagnosis not present

## 2019-07-13 DIAGNOSIS — M9902 Segmental and somatic dysfunction of thoracic region: Secondary | ICD-10-CM | POA: Diagnosis not present

## 2019-07-13 DIAGNOSIS — M9901 Segmental and somatic dysfunction of cervical region: Secondary | ICD-10-CM | POA: Diagnosis not present

## 2019-07-13 DIAGNOSIS — M9903 Segmental and somatic dysfunction of lumbar region: Secondary | ICD-10-CM | POA: Diagnosis not present

## 2019-07-15 DIAGNOSIS — M9905 Segmental and somatic dysfunction of pelvic region: Secondary | ICD-10-CM | POA: Diagnosis not present

## 2019-07-15 DIAGNOSIS — M9903 Segmental and somatic dysfunction of lumbar region: Secondary | ICD-10-CM | POA: Diagnosis not present

## 2019-07-15 DIAGNOSIS — M9902 Segmental and somatic dysfunction of thoracic region: Secondary | ICD-10-CM | POA: Diagnosis not present

## 2019-07-15 DIAGNOSIS — M9901 Segmental and somatic dysfunction of cervical region: Secondary | ICD-10-CM | POA: Diagnosis not present

## 2019-07-21 DIAGNOSIS — M9905 Segmental and somatic dysfunction of pelvic region: Secondary | ICD-10-CM | POA: Diagnosis not present

## 2019-07-21 DIAGNOSIS — M9902 Segmental and somatic dysfunction of thoracic region: Secondary | ICD-10-CM | POA: Diagnosis not present

## 2019-07-21 DIAGNOSIS — M9901 Segmental and somatic dysfunction of cervical region: Secondary | ICD-10-CM | POA: Diagnosis not present

## 2019-07-21 DIAGNOSIS — M9903 Segmental and somatic dysfunction of lumbar region: Secondary | ICD-10-CM | POA: Diagnosis not present

## 2019-07-25 DIAGNOSIS — G4733 Obstructive sleep apnea (adult) (pediatric): Secondary | ICD-10-CM | POA: Diagnosis not present

## 2019-07-25 DIAGNOSIS — I10 Essential (primary) hypertension: Secondary | ICD-10-CM | POA: Diagnosis not present

## 2019-07-25 DIAGNOSIS — L821 Other seborrheic keratosis: Secondary | ICD-10-CM | POA: Diagnosis not present

## 2019-07-25 DIAGNOSIS — E1169 Type 2 diabetes mellitus with other specified complication: Secondary | ICD-10-CM | POA: Diagnosis not present

## 2019-07-28 DIAGNOSIS — M9902 Segmental and somatic dysfunction of thoracic region: Secondary | ICD-10-CM | POA: Diagnosis not present

## 2019-07-28 DIAGNOSIS — M9901 Segmental and somatic dysfunction of cervical region: Secondary | ICD-10-CM | POA: Diagnosis not present

## 2019-07-28 DIAGNOSIS — M9903 Segmental and somatic dysfunction of lumbar region: Secondary | ICD-10-CM | POA: Diagnosis not present

## 2019-07-28 DIAGNOSIS — M9905 Segmental and somatic dysfunction of pelvic region: Secondary | ICD-10-CM | POA: Diagnosis not present

## 2019-08-03 DIAGNOSIS — G4733 Obstructive sleep apnea (adult) (pediatric): Secondary | ICD-10-CM | POA: Diagnosis not present

## 2019-08-04 DIAGNOSIS — M9903 Segmental and somatic dysfunction of lumbar region: Secondary | ICD-10-CM | POA: Diagnosis not present

## 2019-08-04 DIAGNOSIS — M9902 Segmental and somatic dysfunction of thoracic region: Secondary | ICD-10-CM | POA: Diagnosis not present

## 2019-08-04 DIAGNOSIS — M9901 Segmental and somatic dysfunction of cervical region: Secondary | ICD-10-CM | POA: Diagnosis not present

## 2019-08-04 DIAGNOSIS — M9905 Segmental and somatic dysfunction of pelvic region: Secondary | ICD-10-CM | POA: Diagnosis not present

## 2019-08-05 DIAGNOSIS — E782 Mixed hyperlipidemia: Secondary | ICD-10-CM | POA: Diagnosis not present

## 2019-08-05 DIAGNOSIS — J449 Chronic obstructive pulmonary disease, unspecified: Secondary | ICD-10-CM | POA: Diagnosis not present

## 2019-08-05 DIAGNOSIS — I10 Essential (primary) hypertension: Secondary | ICD-10-CM | POA: Diagnosis not present

## 2019-08-05 DIAGNOSIS — N400 Enlarged prostate without lower urinary tract symptoms: Secondary | ICD-10-CM | POA: Diagnosis not present

## 2019-08-16 DIAGNOSIS — M9905 Segmental and somatic dysfunction of pelvic region: Secondary | ICD-10-CM | POA: Diagnosis not present

## 2019-08-16 DIAGNOSIS — M9901 Segmental and somatic dysfunction of cervical region: Secondary | ICD-10-CM | POA: Diagnosis not present

## 2019-08-16 DIAGNOSIS — M9902 Segmental and somatic dysfunction of thoracic region: Secondary | ICD-10-CM | POA: Diagnosis not present

## 2019-08-16 DIAGNOSIS — M9903 Segmental and somatic dysfunction of lumbar region: Secondary | ICD-10-CM | POA: Diagnosis not present

## 2019-09-02 DIAGNOSIS — M9901 Segmental and somatic dysfunction of cervical region: Secondary | ICD-10-CM | POA: Diagnosis not present

## 2019-09-02 DIAGNOSIS — M9902 Segmental and somatic dysfunction of thoracic region: Secondary | ICD-10-CM | POA: Diagnosis not present

## 2019-09-02 DIAGNOSIS — M9903 Segmental and somatic dysfunction of lumbar region: Secondary | ICD-10-CM | POA: Diagnosis not present

## 2019-09-02 DIAGNOSIS — M9905 Segmental and somatic dysfunction of pelvic region: Secondary | ICD-10-CM | POA: Diagnosis not present

## 2019-09-09 DIAGNOSIS — N400 Enlarged prostate without lower urinary tract symptoms: Secondary | ICD-10-CM | POA: Diagnosis not present

## 2019-09-09 DIAGNOSIS — I10 Essential (primary) hypertension: Secondary | ICD-10-CM | POA: Diagnosis not present

## 2019-09-09 DIAGNOSIS — J449 Chronic obstructive pulmonary disease, unspecified: Secondary | ICD-10-CM | POA: Diagnosis not present

## 2019-09-09 DIAGNOSIS — E782 Mixed hyperlipidemia: Secondary | ICD-10-CM | POA: Diagnosis not present

## 2019-09-29 DIAGNOSIS — H524 Presbyopia: Secondary | ICD-10-CM | POA: Diagnosis not present

## 2019-10-24 DIAGNOSIS — N400 Enlarged prostate without lower urinary tract symptoms: Secondary | ICD-10-CM | POA: Diagnosis not present

## 2019-10-24 DIAGNOSIS — N401 Enlarged prostate with lower urinary tract symptoms: Secondary | ICD-10-CM | POA: Diagnosis not present

## 2019-10-24 DIAGNOSIS — R3121 Asymptomatic microscopic hematuria: Secondary | ICD-10-CM | POA: Diagnosis not present

## 2019-10-24 DIAGNOSIS — J449 Chronic obstructive pulmonary disease, unspecified: Secondary | ICD-10-CM | POA: Diagnosis not present

## 2019-10-24 DIAGNOSIS — E782 Mixed hyperlipidemia: Secondary | ICD-10-CM | POA: Diagnosis not present

## 2019-10-24 DIAGNOSIS — R3912 Poor urinary stream: Secondary | ICD-10-CM | POA: Diagnosis not present

## 2019-10-24 DIAGNOSIS — I10 Essential (primary) hypertension: Secondary | ICD-10-CM | POA: Diagnosis not present

## 2019-11-02 DIAGNOSIS — G4733 Obstructive sleep apnea (adult) (pediatric): Secondary | ICD-10-CM | POA: Diagnosis not present

## 2019-12-12 DIAGNOSIS — G4733 Obstructive sleep apnea (adult) (pediatric): Secondary | ICD-10-CM | POA: Diagnosis not present

## 2019-12-12 DIAGNOSIS — E1169 Type 2 diabetes mellitus with other specified complication: Secondary | ICD-10-CM | POA: Diagnosis not present

## 2019-12-12 DIAGNOSIS — E782 Mixed hyperlipidemia: Secondary | ICD-10-CM | POA: Diagnosis not present

## 2019-12-12 DIAGNOSIS — I10 Essential (primary) hypertension: Secondary | ICD-10-CM | POA: Diagnosis not present

## 2019-12-12 DIAGNOSIS — Z0000 Encounter for general adult medical examination without abnormal findings: Secondary | ICD-10-CM | POA: Diagnosis not present

## 2019-12-12 DIAGNOSIS — Z1389 Encounter for screening for other disorder: Secondary | ICD-10-CM | POA: Diagnosis not present

## 2020-01-06 DIAGNOSIS — E782 Mixed hyperlipidemia: Secondary | ICD-10-CM | POA: Diagnosis not present

## 2020-01-06 DIAGNOSIS — I10 Essential (primary) hypertension: Secondary | ICD-10-CM | POA: Diagnosis not present

## 2020-01-06 DIAGNOSIS — N400 Enlarged prostate without lower urinary tract symptoms: Secondary | ICD-10-CM | POA: Diagnosis not present

## 2020-01-06 DIAGNOSIS — J449 Chronic obstructive pulmonary disease, unspecified: Secondary | ICD-10-CM | POA: Diagnosis not present

## 2020-01-18 DIAGNOSIS — Z23 Encounter for immunization: Secondary | ICD-10-CM | POA: Diagnosis not present

## 2020-01-31 DIAGNOSIS — G4733 Obstructive sleep apnea (adult) (pediatric): Secondary | ICD-10-CM | POA: Diagnosis not present

## 2020-04-03 DIAGNOSIS — Z125 Encounter for screening for malignant neoplasm of prostate: Secondary | ICD-10-CM | POA: Diagnosis not present

## 2020-04-03 DIAGNOSIS — E1169 Type 2 diabetes mellitus with other specified complication: Secondary | ICD-10-CM | POA: Diagnosis not present

## 2020-04-03 DIAGNOSIS — I10 Essential (primary) hypertension: Secondary | ICD-10-CM | POA: Diagnosis not present

## 2020-04-03 DIAGNOSIS — R600 Localized edema: Secondary | ICD-10-CM | POA: Diagnosis not present

## 2020-05-02 DIAGNOSIS — G4733 Obstructive sleep apnea (adult) (pediatric): Secondary | ICD-10-CM | POA: Diagnosis not present

## 2020-05-04 DIAGNOSIS — L03032 Cellulitis of left toe: Secondary | ICD-10-CM | POA: Diagnosis not present

## 2020-05-11 DIAGNOSIS — G5762 Lesion of plantar nerve, left lower limb: Secondary | ICD-10-CM | POA: Diagnosis not present

## 2020-05-11 DIAGNOSIS — L03032 Cellulitis of left toe: Secondary | ICD-10-CM | POA: Diagnosis not present

## 2020-05-14 DIAGNOSIS — E782 Mixed hyperlipidemia: Secondary | ICD-10-CM | POA: Diagnosis not present

## 2020-05-14 DIAGNOSIS — J449 Chronic obstructive pulmonary disease, unspecified: Secondary | ICD-10-CM | POA: Diagnosis not present

## 2020-05-14 DIAGNOSIS — I10 Essential (primary) hypertension: Secondary | ICD-10-CM | POA: Diagnosis not present

## 2020-05-14 DIAGNOSIS — N400 Enlarged prostate without lower urinary tract symptoms: Secondary | ICD-10-CM | POA: Diagnosis not present

## 2020-05-18 DIAGNOSIS — N401 Enlarged prostate with lower urinary tract symptoms: Secondary | ICD-10-CM | POA: Diagnosis not present

## 2020-05-18 DIAGNOSIS — R3121 Asymptomatic microscopic hematuria: Secondary | ICD-10-CM | POA: Diagnosis not present

## 2020-05-18 DIAGNOSIS — R3912 Poor urinary stream: Secondary | ICD-10-CM | POA: Diagnosis not present

## 2020-06-12 ENCOUNTER — Ambulatory Visit: Payer: Medicare Other | Admitting: Podiatry

## 2020-06-12 ENCOUNTER — Telehealth: Payer: Self-pay | Admitting: Podiatry

## 2020-06-12 ENCOUNTER — Other Ambulatory Visit: Payer: Self-pay

## 2020-06-12 ENCOUNTER — Encounter: Payer: Self-pay | Admitting: Podiatry

## 2020-06-12 DIAGNOSIS — Z860100 Personal history of colon polyps, unspecified: Secondary | ICD-10-CM | POA: Insufficient documentation

## 2020-06-12 DIAGNOSIS — R319 Hematuria, unspecified: Secondary | ICD-10-CM | POA: Insufficient documentation

## 2020-06-12 DIAGNOSIS — L03032 Cellulitis of left toe: Secondary | ICD-10-CM | POA: Diagnosis not present

## 2020-06-12 DIAGNOSIS — L603 Nail dystrophy: Secondary | ICD-10-CM | POA: Diagnosis not present

## 2020-06-12 DIAGNOSIS — I700 Atherosclerosis of aorta: Secondary | ICD-10-CM | POA: Insufficient documentation

## 2020-06-12 DIAGNOSIS — G4733 Obstructive sleep apnea (adult) (pediatric): Secondary | ICD-10-CM | POA: Insufficient documentation

## 2020-06-12 DIAGNOSIS — G5762 Lesion of plantar nerve, left lower limb: Secondary | ICD-10-CM | POA: Insufficient documentation

## 2020-06-12 DIAGNOSIS — N529 Male erectile dysfunction, unspecified: Secondary | ICD-10-CM | POA: Insufficient documentation

## 2020-06-12 DIAGNOSIS — R609 Edema, unspecified: Secondary | ICD-10-CM | POA: Insufficient documentation

## 2020-06-12 DIAGNOSIS — E782 Mixed hyperlipidemia: Secondary | ICD-10-CM | POA: Insufficient documentation

## 2020-06-12 DIAGNOSIS — I491 Atrial premature depolarization: Secondary | ICD-10-CM | POA: Insufficient documentation

## 2020-06-12 DIAGNOSIS — E662 Morbid (severe) obesity with alveolar hypoventilation: Secondary | ICD-10-CM | POA: Insufficient documentation

## 2020-06-12 DIAGNOSIS — J449 Chronic obstructive pulmonary disease, unspecified: Secondary | ICD-10-CM | POA: Insufficient documentation

## 2020-06-12 DIAGNOSIS — Z6834 Body mass index (BMI) 34.0-34.9, adult: Secondary | ICD-10-CM | POA: Insufficient documentation

## 2020-06-12 MED ORDER — CIPROFLOXACIN HCL 500 MG PO TABS: 500.0000 mg | ORAL_TABLET | Freq: Two times a day (BID) | ORAL | 0 refills | Status: DC

## 2020-06-12 MED ORDER — NEOMYCIN-POLYMYXIN-HC 1 % OT SOLN: OTIC | 1 refills | Status: DC

## 2020-06-12 NOTE — Telephone Encounter (Signed)
Patient states that when he went to pick up his Rx for Ciprofloxacin HCl 500 mg and Neomycin-Polymyxin-HC 1 %, that the drops were ready for pick up but the oral antibiotic was not. Please advise, or rx Ciprofloxacin

## 2020-06-12 NOTE — Telephone Encounter (Signed)
Electronic prescribing error... resent and pharmacy will contact patient when ready

## 2020-06-12 NOTE — Addendum Note (Signed)
Addended by: Kristian Covey on: 06/12/2020 04:59 PM   Modules accepted: Orders

## 2020-06-12 NOTE — Patient Instructions (Signed)

## 2020-06-12 NOTE — Progress Notes (Signed)
Subjective:  Patient ID: Joseph Clay, male    DOB: May 18, 1950,  MRN: [id number] HPI Chief Complaint  Patient presents with  . Toe Pain    Hallux left - toenail discolored and long x several months, took antibiotics for infection, still some swelling and redness at the base, not sore anymore, nail keeps falling off, soaking and using antifungal topical meds-no help  . Diabetes    LAst a1c was 6.1  . New Patient (Initial Visit)    70 y.o. male presents with the above complaint.   ROS: Denies fever chills nausea vomiting muscle aches pains calf pain back pain chest pain shortness of breath.  Past Medical History:  Diagnosis Date  . Arthritis   . Bronchitis    couple of months ago  . Chronic back pain    bulding disc  . Chronic kidney disease    bladder outlet obstruction-  RECENT HOSPITALIZATION WITH ACUTE RENAL FAILURE 2/13-      recent scans in EPIC from 2/13-  . Diabetes mellitus    takes Metformin bid  . Hx of colonic polyps   . Hyperlipidemia    takes Pravastatin daily  . Hypertension    EKG, Chest 2/13 EPIC- followed by Dr Roseanne Reno, labs 05/31/11  epic  . Joint pain    right shoulder and right knee  . Sleep apnea    CPAP-sleep study done 74months ago/ UNABLE TO LOCATE REPORT /  states SEVERE- doesnt know settings   Past Surgical History:  Procedure Laterality Date  . CHOLECYSTECTOMY N/A 05/21/2015   Procedure: LAPAROSCOPIC CHOLECYSTECTOMY WITH INTRAOPERATIVE CHOLANGIOGRAM, BIOPSY RIGHT LOBE OF LIVER;  Surgeon: Ovidio Kin, MD;  Location: [facility];  Service: General;  Laterality: N/A;  . colonscopy    . CYSTOSCOPY  06/16/2011   Procedure: CYSTOSCOPY;  Surgeon: Crecencio Mc, MD;  Location: [facility];  Service: Urology;  Laterality: N/A;       . ERCP N/A 05/17/2015   Procedure: ENDOSCOPIC RETROGRADE CHOLANGIOPANCREATOGRAPHY (ERCP);  Surgeon: Vida Rigger, MD;  Location: [facility];  Service: Endoscopy;  Laterality: N/A;  . RT shoulder surgery     bone spur,scrape rotor cuff   . SHOULDER ARTHROSCOPY     right   05/23/11/   [place]  . TONSILLECTOMY     as a child  . TRANSURETHRAL RESECTION OF PROSTATE  06/16/2011   Procedure: TRANSURETHRAL RESECTION OF THE PROSTATE (TURP);  Surgeon: Crecencio Mc, MD;  Location: [facility];  Service: Urology;  Laterality: N/A;  . TRANSURETHRAL RESECTION OF PROSTATE     03-18-18   . TRANSURETHRAL RESECTION OF PROSTATE N/A 03/18/2018   Procedure: TRANSURETHRAL RESECTION OF THE PROSTATE (TURP), CYSTOLITHALOPAXY WITH HOLMIUM LASER, CYSTOSCOPY;  Surgeon: Heloise Purpura, MD;  Location: [facility];  Service: Urology;  Laterality: N/A;    Current Outpatient Medications:  .  NEOMYCIN-POLYMYXIN-HYDROCORTISONE (CORTISPORIN) 1 % SOLN OTIC solution, Apply 1-2 drops to toe BID after soaking, Disp: 10 mL, Rfl: 1 .  amLODipine (NORVASC) 5 MG tablet, Take 5 mg by mouth daily. , Disp: , Rfl:  .  ciprofloxacin (CIPRO) 500 MG tablet, Take 1 tablet (500 mg total) by mouth 2 (two) times daily., Disp: 20 tablet, Rfl: 0 .  furosemide (LASIX) 40 MG tablet, Take 40 mg by mouth daily. , Disp: , Rfl:  .  lisinopril (PRINIVIL,ZESTRIL) 20 MG tablet, Take 20 mg by mouth daily., Disp: , Rfl:  .  metFORMIN (GLUCOPHAGE) 1000 MG tablet, Take 1,000 mg by mouth 2 (two) times daily.,  Disp: , Rfl: 0 .  ONETOUCH ULTRA test strip, SMARTSIG:1 Strip(s) Via Meter Morning-Evening, Disp: , Rfl:  .  OZEMPIC, 0.25 OR 0.5 MG/DOSE, 2 MG/1.5ML SOPN, Inject 0.5 mg into the skin once a week., Disp: , Rfl:  .  potassium chloride SA (K-DUR,KLOR-CON) 20 MEQ tablet, Take 20 mEq by mouth daily. , Disp: , Rfl:  .  pravastatin (PRAVACHOL) 40 MG tablet, Take 20 mg by mouth every evening. , Disp: , Rfl:   No Known Allergies Review of Systems Objective:  There were no vitals filed for this visit.  General: Well developed, nourished, in no acute distress, alert and oriented x3   Dermatological: Skin is warm, dry and supple bilateral. Nails x 10 are well maintained; remaining integument appears  unremarkable at this time. There are no open sores, no preulcerative lesions, no rash or signs of infection present.  Hallux nail left demonstrates considerable nail dystrophy with flaking of the nail and what appears to be onychomycosis.  The proximal nail fold does demonstrate a green abscess which is most likely Pseudomonas with some cellulitic changes to the level of the hallux interphalangeal joint.  Vascular: Dorsalis Pedis artery and Posterior Tibial artery pedal pulses are 2/4 bilateral with immedate capillary fill time. Pedal hair growth present. No varicosities and no lower extremity edema present bilateral.   Neruologic: Grossly intact via light touch bilateral. Vibratory intact via tuning fork bilateral. Protective threshold with Semmes Wienstein monofilament intact to all pedal sites bilateral. Patellar and Achilles deep tendon reflexes 2+ bilateral. No Babinski or clonus noted bilateral.   Musculoskeletal: No gross boney pedal deformities bilateral. No pain, crepitus, or limitation noted with foot and ankle range of motion bilateral. Muscular strength 5/5 in all groups tested bilateral.  Gait: Unassisted, Nonantalgic.    Radiographs:  None taken  Assessment & Plan:   Assessment: Abscess hallux nail left.  Nail dystrophy hallux left.  Plan: Total nail avulsion with debridement of all necrotic tissue.  This was performed after local anesthetic was administered he tolerated the procedure well.  Consider amount of bleeding.  Dressed her dressing was applied provided him with a prescription for Cipro 500 mg 1 p.o. twice daily for 7 to 10 days also started him on a Corticosporin otic drops to be applied after soaking twice daily.  We will follow-up with him in 2 weeks     Ogden Handlin T. [place], [place]

## 2020-06-26 ENCOUNTER — Encounter: Payer: Self-pay | Admitting: Podiatry

## 2020-06-26 ENCOUNTER — Other Ambulatory Visit: Payer: Self-pay

## 2020-06-26 ENCOUNTER — Ambulatory Visit: Payer: Medicare Other | Admitting: Podiatry

## 2020-06-26 DIAGNOSIS — M19012 Primary osteoarthritis, left shoulder: Secondary | ICD-10-CM | POA: Diagnosis not present

## 2020-06-26 DIAGNOSIS — M7502 Adhesive capsulitis of left shoulder: Secondary | ICD-10-CM | POA: Diagnosis not present

## 2020-06-26 DIAGNOSIS — L03032 Cellulitis of left toe: Secondary | ICD-10-CM

## 2020-06-26 DIAGNOSIS — M4692 Unspecified inflammatory spondylopathy, cervical region: Secondary | ICD-10-CM | POA: Diagnosis not present

## 2020-06-26 NOTE — Progress Notes (Signed)
He presents today for follow-up of his nail avulsion hallux left.  States that is doing much better.  He would like to set up a routine care.  Objective: Vital signs are stable he is alert oriented x3 hallux left demonstrates no erythema edema cellulitis drainage odor appears to be healing uneventfully.  Some postinflammatory hyperpigmentation is noted around the proximal nail fold no purulence no drainage.  Assessment: Well-healing hallux left.  Plan: Follow-up with him on an as-needed basis for me I did encourage him to soak Epson salts and warm water every other day until the discoloration goes away.  He will follow-up with Dr Eloy End.

## 2020-07-03 ENCOUNTER — Encounter: Payer: Self-pay | Admitting: Podiatry

## 2020-07-03 ENCOUNTER — Ambulatory Visit: Payer: Medicare Other | Admitting: Podiatry

## 2020-07-03 ENCOUNTER — Other Ambulatory Visit: Payer: Self-pay

## 2020-07-03 DIAGNOSIS — B351 Tinea unguium: Secondary | ICD-10-CM | POA: Diagnosis not present

## 2020-07-03 DIAGNOSIS — M79675 Pain in left toe(s): Secondary | ICD-10-CM | POA: Diagnosis not present

## 2020-07-03 DIAGNOSIS — E119 Type 2 diabetes mellitus without complications: Secondary | ICD-10-CM

## 2020-07-03 DIAGNOSIS — J449 Chronic obstructive pulmonary disease, unspecified: Secondary | ICD-10-CM | POA: Diagnosis not present

## 2020-07-03 DIAGNOSIS — E782 Mixed hyperlipidemia: Secondary | ICD-10-CM | POA: Diagnosis not present

## 2020-07-03 DIAGNOSIS — M79674 Pain in right toe(s): Secondary | ICD-10-CM

## 2020-07-03 DIAGNOSIS — E0869 Diabetes mellitus due to underlying condition with other specified complication: Secondary | ICD-10-CM | POA: Diagnosis not present

## 2020-07-03 DIAGNOSIS — I10 Essential (primary) hypertension: Secondary | ICD-10-CM | POA: Diagnosis not present

## 2020-07-08 NOTE — Progress Notes (Signed)
  Subjective:  Patient ID: Joseph Clay, male    DOB: April 23, 1950,  MRN: [id number]  70 y.o. male presents with preventative diabetic foot care and painful thick toenails that are difficult to trim. Pain interferes with ambulation. Aggravating factors include wearing enclosed shoe gear. Pain is relieved with periodic professional debridement..    Patient does not monitor blood glucose daily.   PCP: Kirby Funk, MD and last visit was: 06/07/2020. He is s/p total nail avulsion of left hallux. States it is healing well.   Review of Systems: Negative except as noted in the HPI.   No Known Allergies  Objective:  There were no vitals filed for this visit. Constitutional Patient is a pleasant 70 y.o. Caucasian male in NAD. AAO x 3.  Vascular Capillary refill time to digits immediate b/l. Palpable pedal pulses b/l LE. Pedal hair sparse. Lower extremity skin temperature gradient within normal limits. No pain with calf compression b/l. No edema noted b/l lower extremities. No cyanosis or clubbing noted.  Neurologic Normal speech. Protective sensation intact 5/5 intact bilaterally with 10g monofilament b/l. Vibratory sensation intact b/l.  Dermatologic Pedal skin with normal turgor, texture and tone bilaterally. No open wounds bilaterally. No interdigital macerations bilaterally. Toenails 2-5 bilaterally and R hallux elongated, discolored, dystrophic, thickened, and crumbly with subungual debris and tenderness to dorsal palpation. Anonychia noted L hallux. Nailbed(s) epithelialized.   Orthopedic: Normal muscle strength 5/5 to all lower extremity muscle groups bilaterally. No pain crepitus or joint limitation noted with ROM b/l. No gross bony deformities bilaterally. Patient ambulates independent of any assistive aids.    Assessment:   1. Pain due to onychomycosis of toenails of both feet   2. Type 2 diabetes mellitus without complication, without long-term current use of insulin (HCC)    Plan:   Patient was evaluated and treated and all questions answered.  Onychomycosis with pain -Nails palliatively debridement as below. -Educated on self-care  Procedure: Nail Debridement Rationale: Pain Type of Debridement: manual, sharp debridement. Instrumentation: Nail nipper, rotary burr. Number of Nails: 9  -Examined patient. -Continue diabetic foot care principles. -Patient to continue soft, supportive shoe gear daily. -Toenails 1-5 b/l were debrided in length and girth with sterile nail nippers and dremel without iatrogenic bleeding.  -Patient to report any pedal injuries to medical professional immediately. -Patient/POA to call should there be question/concern in the interim.  Return in about 3 months (around 10/03/2020).  Freddie Breech, DPM

## 2020-07-09 DIAGNOSIS — M5032 Other cervical disc degeneration, mid-cervical region, unspecified level: Secondary | ICD-10-CM | POA: Diagnosis not present

## 2020-07-09 DIAGNOSIS — M7502 Adhesive capsulitis of left shoulder: Secondary | ICD-10-CM | POA: Diagnosis not present

## 2020-07-12 DIAGNOSIS — M7502 Adhesive capsulitis of left shoulder: Secondary | ICD-10-CM | POA: Diagnosis not present

## 2020-07-12 DIAGNOSIS — M5032 Other cervical disc degeneration, mid-cervical region, unspecified level: Secondary | ICD-10-CM | POA: Diagnosis not present

## 2020-07-16 DIAGNOSIS — M5032 Other cervical disc degeneration, mid-cervical region, unspecified level: Secondary | ICD-10-CM | POA: Diagnosis not present

## 2020-07-16 DIAGNOSIS — M7502 Adhesive capsulitis of left shoulder: Secondary | ICD-10-CM | POA: Diagnosis not present

## 2020-07-19 DIAGNOSIS — M5032 Other cervical disc degeneration, mid-cervical region, unspecified level: Secondary | ICD-10-CM | POA: Diagnosis not present

## 2020-07-19 DIAGNOSIS — M7502 Adhesive capsulitis of left shoulder: Secondary | ICD-10-CM | POA: Diagnosis not present

## 2020-07-23 DIAGNOSIS — M5032 Other cervical disc degeneration, mid-cervical region, unspecified level: Secondary | ICD-10-CM | POA: Diagnosis not present

## 2020-07-23 DIAGNOSIS — M7502 Adhesive capsulitis of left shoulder: Secondary | ICD-10-CM | POA: Diagnosis not present

## 2020-07-26 DIAGNOSIS — M7502 Adhesive capsulitis of left shoulder: Secondary | ICD-10-CM | POA: Diagnosis not present

## 2020-07-26 DIAGNOSIS — M5032 Other cervical disc degeneration, mid-cervical region, unspecified level: Secondary | ICD-10-CM | POA: Diagnosis not present

## 2020-07-30 DIAGNOSIS — M7502 Adhesive capsulitis of left shoulder: Secondary | ICD-10-CM | POA: Diagnosis not present

## 2020-07-30 DIAGNOSIS — M5032 Other cervical disc degeneration, mid-cervical region, unspecified level: Secondary | ICD-10-CM | POA: Diagnosis not present

## 2020-07-31 DIAGNOSIS — G4733 Obstructive sleep apnea (adult) (pediatric): Secondary | ICD-10-CM | POA: Diagnosis not present

## 2020-08-02 DIAGNOSIS — M7502 Adhesive capsulitis of left shoulder: Secondary | ICD-10-CM | POA: Diagnosis not present

## 2020-08-02 DIAGNOSIS — R600 Localized edema: Secondary | ICD-10-CM | POA: Diagnosis not present

## 2020-08-02 DIAGNOSIS — G4733 Obstructive sleep apnea (adult) (pediatric): Secondary | ICD-10-CM | POA: Diagnosis not present

## 2020-08-02 DIAGNOSIS — M5032 Other cervical disc degeneration, mid-cervical region, unspecified level: Secondary | ICD-10-CM | POA: Diagnosis not present

## 2020-08-02 DIAGNOSIS — E0869 Diabetes mellitus due to underlying condition with other specified complication: Secondary | ICD-10-CM | POA: Diagnosis not present

## 2020-08-02 DIAGNOSIS — E1169 Type 2 diabetes mellitus with other specified complication: Secondary | ICD-10-CM | POA: Diagnosis not present

## 2020-08-02 DIAGNOSIS — I10 Essential (primary) hypertension: Secondary | ICD-10-CM | POA: Diagnosis not present

## 2020-08-06 DIAGNOSIS — M7502 Adhesive capsulitis of left shoulder: Secondary | ICD-10-CM | POA: Diagnosis not present

## 2020-08-06 DIAGNOSIS — M5032 Other cervical disc degeneration, mid-cervical region, unspecified level: Secondary | ICD-10-CM | POA: Diagnosis not present

## 2020-08-30 DIAGNOSIS — I10 Essential (primary) hypertension: Secondary | ICD-10-CM | POA: Diagnosis not present

## 2020-09-05 DIAGNOSIS — E782 Mixed hyperlipidemia: Secondary | ICD-10-CM | POA: Diagnosis not present

## 2020-09-05 DIAGNOSIS — N400 Enlarged prostate without lower urinary tract symptoms: Secondary | ICD-10-CM | POA: Diagnosis not present

## 2020-09-05 DIAGNOSIS — I10 Essential (primary) hypertension: Secondary | ICD-10-CM | POA: Diagnosis not present

## 2020-09-05 DIAGNOSIS — J449 Chronic obstructive pulmonary disease, unspecified: Secondary | ICD-10-CM | POA: Diagnosis not present

## 2020-10-16 DIAGNOSIS — H524 Presbyopia: Secondary | ICD-10-CM | POA: Diagnosis not present

## 2020-10-17 ENCOUNTER — Ambulatory Visit: Payer: Medicare Other | Admitting: Podiatry

## 2020-11-12 DIAGNOSIS — I10 Essential (primary) hypertension: Secondary | ICD-10-CM | POA: Diagnosis not present

## 2020-11-12 DIAGNOSIS — E782 Mixed hyperlipidemia: Secondary | ICD-10-CM | POA: Diagnosis not present

## 2020-11-12 DIAGNOSIS — N400 Enlarged prostate without lower urinary tract symptoms: Secondary | ICD-10-CM | POA: Diagnosis not present

## 2020-11-12 DIAGNOSIS — J449 Chronic obstructive pulmonary disease, unspecified: Secondary | ICD-10-CM | POA: Diagnosis not present

## 2020-11-26 ENCOUNTER — Ambulatory Visit: Payer: Medicare Other | Admitting: Podiatry

## 2020-12-13 DIAGNOSIS — Z1389 Encounter for screening for other disorder: Secondary | ICD-10-CM | POA: Diagnosis not present

## 2020-12-13 DIAGNOSIS — E1169 Type 2 diabetes mellitus with other specified complication: Secondary | ICD-10-CM | POA: Diagnosis not present

## 2020-12-13 DIAGNOSIS — E782 Mixed hyperlipidemia: Secondary | ICD-10-CM | POA: Diagnosis not present

## 2020-12-13 DIAGNOSIS — I10 Essential (primary) hypertension: Secondary | ICD-10-CM | POA: Diagnosis not present

## 2020-12-13 DIAGNOSIS — Z0000 Encounter for general adult medical examination without abnormal findings: Secondary | ICD-10-CM | POA: Diagnosis not present

## 2020-12-13 DIAGNOSIS — E662 Morbid (severe) obesity with alveolar hypoventilation: Secondary | ICD-10-CM | POA: Diagnosis not present

## 2020-12-13 DIAGNOSIS — Z6836 Body mass index (BMI) 36.0-36.9, adult: Secondary | ICD-10-CM | POA: Diagnosis not present

## 2021-01-14 ENCOUNTER — Ambulatory Visit: Payer: Medicare Other | Admitting: Podiatry

## 2021-01-14 ENCOUNTER — Encounter: Payer: Self-pay | Admitting: Podiatry

## 2021-01-14 ENCOUNTER — Other Ambulatory Visit: Payer: Self-pay

## 2021-01-14 DIAGNOSIS — M79674 Pain in right toe(s): Secondary | ICD-10-CM

## 2021-01-14 DIAGNOSIS — M79675 Pain in left toe(s): Secondary | ICD-10-CM

## 2021-01-14 DIAGNOSIS — B351 Tinea unguium: Secondary | ICD-10-CM

## 2021-01-14 DIAGNOSIS — E119 Type 2 diabetes mellitus without complications: Secondary | ICD-10-CM | POA: Diagnosis not present

## 2021-01-14 NOTE — Progress Notes (Signed)
  Subjective:  Patient ID: Joseph Clay, male    DOB: [birth year],  MRN: [id number]  70 y.o. male presents preventative diabetic foot care and thick, elongated toenails 2-5 bilaterally and R hallux which are tender when wearing enclosed shoe gear.  Patient does not monitor blood glucose daily.  He voices no new pedal problems on today's visit.  PCP is Kirby Funk, MD , and last visit was August, 2022.  No Known Allergies  Review of Systems: Negative except as noted in the HPI.   Objective:  Vascular Examination: Capillary refill time to digits immediate b/l. Palpable DP pulse(s) b/l lower extremities Palpable PT pulse(s) b/l lower extremities Pedal hair sparse. Lower extremity skin temperature gradient within normal limits. No pain with calf compression b/l. Trace edema noted b/l lower extremities. Evidence of chronic venous insufficiency b/l lower extremities.  Neurological Examination: Protective sensation intact 5/5 intact bilaterally with 10g monofilament b/l. Vibratory sensation intact b/l.  Dermatological Examination: Skin warm and supple b/l lower extremities. No open wounds b/l lower extremities. No interdigital macerations b/l lower extremities. Toenails 2-5 bilaterally and R hallux severely elongated, thickened with discoloration, also possessing excessive curvature and impinging onto pedal the distal tips of the digits. No erythema, no edema, no drainage, no fluctuance. Anonychia noted L hallux. Nailbed(s) epithelialized.  Evidence of chronic venous insufficiency b/l lower extremities.  Musculoskeletal Examination: Patient ambulates independent of any assistive aids. Muscle strength 5/5 to b/l LE.  Radiographs: None Assessment:   1. Pain due to onychomycosis of toenails of both feet   2. Type 2 diabetes mellitus without complication, without long-term current use of insulin (HCC)    Plan:  -No new findings. No new orders. -Continue diabetic foot care principles:  inspect feet daily, monitor glucose as recommended by PCP and/or Endocrinologist, and follow prescribed diet per PCP, Endocrinologist and/or dietician. -Patient to continue soft, supportive shoe gear daily. -Toenails 2-5 bilaterally and R hallux debrided in length and girth without iatrogenic bleeding with sterile nail nipper and dremel.  -Patient to report any pedal injuries to medical professional immediately. -Patient/POA to call should there be question/concern in the interim.  Return in about 3 months (around 04/16/2021).  Freddie Breech, DPM

## 2021-02-14 DIAGNOSIS — U071 COVID-19: Secondary | ICD-10-CM | POA: Diagnosis not present

## 2021-02-14 DIAGNOSIS — E782 Mixed hyperlipidemia: Secondary | ICD-10-CM | POA: Diagnosis not present

## 2021-02-14 DIAGNOSIS — N400 Enlarged prostate without lower urinary tract symptoms: Secondary | ICD-10-CM | POA: Diagnosis not present

## 2021-02-14 DIAGNOSIS — J449 Chronic obstructive pulmonary disease, unspecified: Secondary | ICD-10-CM | POA: Diagnosis not present

## 2021-02-14 DIAGNOSIS — I10 Essential (primary) hypertension: Secondary | ICD-10-CM | POA: Diagnosis not present

## 2021-04-22 ENCOUNTER — Ambulatory Visit: Payer: Medicare Other | Admitting: Podiatry

## 2021-04-22 ENCOUNTER — Other Ambulatory Visit: Payer: Self-pay

## 2021-04-22 DIAGNOSIS — E119 Type 2 diabetes mellitus without complications: Secondary | ICD-10-CM

## 2021-04-22 DIAGNOSIS — M79675 Pain in left toe(s): Secondary | ICD-10-CM | POA: Diagnosis not present

## 2021-04-22 DIAGNOSIS — I872 Venous insufficiency (chronic) (peripheral): Secondary | ICD-10-CM

## 2021-04-22 DIAGNOSIS — M79674 Pain in right toe(s): Secondary | ICD-10-CM | POA: Diagnosis not present

## 2021-04-22 DIAGNOSIS — B351 Tinea unguium: Secondary | ICD-10-CM

## 2021-04-23 DIAGNOSIS — G4733 Obstructive sleep apnea (adult) (pediatric): Secondary | ICD-10-CM | POA: Diagnosis not present

## 2021-04-27 ENCOUNTER — Encounter: Payer: Self-pay | Admitting: Podiatry

## 2021-04-27 NOTE — Progress Notes (Signed)
ANNUAL DIABETIC FOOT EXAM  Subjective: AASIR DELACERDA presents today for for annual diabetic foot examination and painful elongated mycotic toenails 1-5 bilaterally which are tender when wearing enclosed shoe gear. Pain is relieved with periodic professional debridement..  Patient relates 5 year h/o diabetes.  Patient denies any h/o foot wounds.  Patient denies any numbness, tingling, burning, or pins/needle sensation in feet.  Patient did not check blood glucose this morning.  Lavone Orn, MD is patient's PCP. Last visit was 03/28/2021.  Past Medical History:  Diagnosis Date   Arthritis    Bronchitis    couple of months ago   Chronic back pain    bulding disc   Chronic kidney disease    bladder outlet obstruction-  RECENT HOSPITALIZATION WITH ACUTE RENAL FAILURE 2/13-      recent scans in [facility] from 2/13-   Diabetes mellitus    takes Metformin bid   Hx of colonic polyps    Hyperlipidemia    takes Pravastatin daily   Hypertension    EKG, Chest 2/13 [facility]- followed by Dr Electa Sniff, labs 05/31/11  epic   Joint pain    right shoulder and right knee   Sleep apnea    CPAP-sleep study done 1months ago/ UNABLE TO LOCATE REPORT /  states [facility]- doesnt know settings   Patient Active Problem List   Diagnosis Date Noted   Body mass index (BMI) 34.0-34.9, adult 06/12/2020   Chronic obstructive pulmonary disease, unspecified ([place]) 06/12/2020   Edema 06/12/2020   Erectile dysfunction 06/12/2020   Extreme obesity with alveolar hypoventilation ([place]) 06/12/2020   Hardening of the aorta (main artery of the heart) ([place]) 06/12/2020   Hematuria 06/12/2020   Mixed hyperlipidemia 06/12/2020   Morton's neuroma of left foot 06/12/2020   Obstructive sleep apnea syndrome 06/12/2020   Personal history of colonic polyps 06/12/2020   Supraventricular premature beats 06/12/2020   Benign prostatic hyperplasia 03/18/2018   Sepsis ([place]) 05/23/2015   Bacteremia 05/23/2015   Cholangiolitis  05/23/2015   Choledocholithiasis 05/23/2015   Lactic acidosis 05/15/2015   Hyperbilirubinemia 05/15/2015   Transaminitis 05/15/2015   ARF (acute renal failure) ([place]) 05/31/2011   Bladder outlet obstruction 05/31/2011   HTN (hypertension) 05/31/2011   DM type 2 (diabetes mellitus, type 2) ([place]) 05/31/2011   Past Surgical History:  Procedure Laterality Date   CHOLECYSTECTOMY N/A 05/21/2015   Procedure: LAPAROSCOPIC CHOLECYSTECTOMY WITH INTRAOPERATIVE CHOLANGIOGRAM, BIOPSY RIGHT LOBE OF LIVER;  Surgeon: Alphonsa Overall, MD;  Location: [facility];  Service: General;  Laterality: N/A;   colonscopy     CYSTOSCOPY  06/16/2011   Procedure: Consuela Mimes;  Surgeon: Dutch Gray, MD;  Location: [facility];  Service: Urology;  Laterality: N/A;        ERCP N/A 05/17/2015   Procedure: ENDOSCOPIC RETROGRADE CHOLANGIOPANCREATOGRAPHY (ERCP);  Surgeon: Clarene Essex, MD;  Location: [facility];  Service: Endoscopy;  Laterality: N/A;   RT shoulder surgery     bone spur,scrape rotor cuff   SHOULDER ARTHROSCOPY     right   05/23/11/   [place]   TONSILLECTOMY     as a child   TRANSURETHRAL RESECTION OF PROSTATE  06/16/2011   Procedure: TRANSURETHRAL RESECTION OF THE PROSTATE (TURP);  Surgeon: Dutch Gray, MD;  Location: [facility];  Service: Urology;  Laterality: N/A;   TRANSURETHRAL RESECTION OF PROSTATE     03-18-18    TRANSURETHRAL RESECTION OF PROSTATE N/A 03/18/2018   Procedure: TRANSURETHRAL RESECTION OF THE PROSTATE (TURP), CYSTOLITHALOPAXY WITH HOLMIUM LASER, CYSTOSCOPY;  Surgeon: Raynelle Bring, MD;  Location: [facility];  Service: Urology;  Laterality: N/A;   Current Outpatient Medications on File Prior to Visit  Medication Sig Dispense Refill   amLODipine (NORVASC) 10 MG tablet 1 tablet     amLODipine (NORVASC) 10 MG tablet Take 10 mg by mouth daily.     amLODipine (NORVASC) 5 MG tablet Take 5 mg by mouth daily.      amoxicillin (AMOXIL) 500 MG capsule Take by mouth.     chlorhexidine (PERIDEX) 0.12 % solution  SMARTSIG:15 Milliliter(s) By Mouth 3 Times Daily     furosemide (LASIX) 40 MG tablet Take 40 mg by mouth daily.      ibuprofen (ADVIL) 800 MG tablet Take 800 mg by mouth 3 (three) times daily as needed.     lisinopril (PRINIVIL,ZESTRIL) 20 MG tablet Take 20 mg by mouth daily.     metFORMIN (GLUCOPHAGE) 1000 MG tablet Take 1,000 mg by mouth 2 (two) times daily.  0   NEOMYCIN-POLYMYXIN-HYDROCORTISONE (CORTISPORIN) 1 % SOLN OTIC solution Apply 1-2 drops to toe BID after soaking 10 mL 1   ONETOUCH ULTRA test strip SMARTSIG:1 Strip(s) Via Meter Morning-Evening     OZEMPIC, 0.25 OR 0.5 MG/DOSE, 2 MG/1.5ML SOPN Inject 0.5 mg into the skin once a week.     PENNSAID 2 % SOLN 1-2 pumps twice a day     potassium chloride SA (K-DUR,KLOR-CON) 20 MEQ tablet Take 20 mEq by mouth daily.      pravastatin (PRAVACHOL) 20 MG tablet Take 20 mg by mouth daily.     telmisartan (MICARDIS) 80 MG tablet Take 80 mg by mouth daily.     No current facility-administered medications on file prior to visit.    No Known Allergies Social History   Occupational History   Not on file  Tobacco Use   Smoking status: Former   Smokeless tobacco: Never   Tobacco comments:    quit 20+yrs ago  Vaping Use   Vaping Use: Never used  Substance and Sexual Activity   Alcohol use: No    Comment: occasionally    Drug use: No   Sexual activity: Never   Family History  Problem Relation Age of Onset   Anesthesia problems Neg Hx    Hypotension Neg Hx    Malignant hyperthermia Neg Hx    Pseudochol deficiency Neg Hx    Immunization History  Administered Date(s) Administered   Influenza Split 02/16/2009, 02/21/2010, 02/27/2011, 01/09/2012, 01/17/2013, 02/12/2018   Influenza, High Dose Seasonal PF 01/24/2014, 02/05/2015, 01/24/2016, 01/07/2017, 01/19/2019, 01/18/2020   Moderna Sars-Covid-2 Vaccination 05/16/2019, 06/14/2019, 02/07/2020   Pneumococcal Conjugate-13 01/24/2014   Pneumococcal Polysaccharide-23 12/23/2006, 02/05/2015    Td 08/06/2015   Tdap 12/08/2005   Zoster, Live 07/08/2012, 05/18/2018, 12/06/2018     Review of Systems: Negative except as noted in the HPI.   Objective: There were no vitals filed for this visit.  MONTY CALIENDO is a pleasant 72 y.o. male in NAD. AAO X 3.  Vascular Examination: CFT immediate b/l LE. Palpable DP/PT pulses b/l LE. Digital hair sparse b/l. Skin temperature gradient WNL b/l. No pain with calf compression b/l. Trace edema b/l LE. No cyanosis or clubbing noted b/l LE. Evidence of chronic venous insufficiency b/l LE.  Dermatological Examination: Pedal integument with normal turgor, texture and tone b/l LE. No open wounds b/l. No interdigital macerations b/l. Toenails 2-5 bilaterally and R hallux elongated, thickened, discolored with subungual debris. +Tenderness with dorsal palpation of nailplates. No hyperkeratotic or  porokeratotic lesions present. Anonychia noted L hallux. Nailbed(s) epithelialized.  Evidence of chronic venous insufficiency b/l lower extremities.  Musculoskeletal Examination: Normal muscle strength 5/5 to all lower extremity muscle groups bilaterally. No pain, crepitus or joint limitation noted with ROM b/l LE. No gross bony pedal deformities b/l. Patient ambulates independently without assistive aids.  Footwear Assessment: Does the patient wear appropriate shoes? Yes. Does the patient need inserts/orthotics? No.  Neurological Examination: Protective sensation intact 5/5 intact bilaterally with 10g monofilament b/l. Vibratory sensation diminished b/l. Proprioception intact bilaterally.  Assessment: 1. Pain due to onychomycosis of toenails of both feet   2. Chronic venous insufficiency   3. Type 2 diabetes mellitus without complication, without long-term current use of insulin ([place])   4. Encounter for diabetic foot exam ([place])     ADA Risk Categorization: Low Risk :  Patient has all of the following: Intact protective sensation No prior foot  ulcer  No severe deformity Pedal pulses present  Plan: -Diabetic foot examination performed today. -Continue foot and shoe inspections daily. Monitor blood glucose per PCP/Endocrinologist's recommendations. -Toenails 2-5 bilaterally were debrided in length and girth with sterile nail nippers and dremel. Pinpoint bleeding of L hallux addressed with Lumicain Hemostatic Solution, cleansed with alcohol. triple antibiotic ointment applied. Patient instructed to remove band-aid on tomorrow. No further treatment required. -Patient/POA to call should there be question/concern in the interim.  Return in about 3 months (around 07/21/2021).  Marzetta Board, DPM

## 2021-05-17 DIAGNOSIS — R3912 Poor urinary stream: Secondary | ICD-10-CM | POA: Diagnosis not present

## 2021-05-17 DIAGNOSIS — N401 Enlarged prostate with lower urinary tract symptoms: Secondary | ICD-10-CM | POA: Diagnosis not present

## 2021-06-05 DIAGNOSIS — E782 Mixed hyperlipidemia: Secondary | ICD-10-CM | POA: Diagnosis not present

## 2021-06-05 DIAGNOSIS — N400 Enlarged prostate without lower urinary tract symptoms: Secondary | ICD-10-CM | POA: Diagnosis not present

## 2021-06-05 DIAGNOSIS — I10 Essential (primary) hypertension: Secondary | ICD-10-CM | POA: Diagnosis not present

## 2021-06-05 DIAGNOSIS — J449 Chronic obstructive pulmonary disease, unspecified: Secondary | ICD-10-CM | POA: Diagnosis not present

## 2021-06-10 DIAGNOSIS — I10 Essential (primary) hypertension: Secondary | ICD-10-CM | POA: Diagnosis not present

## 2021-06-10 DIAGNOSIS — J449 Chronic obstructive pulmonary disease, unspecified: Secondary | ICD-10-CM | POA: Diagnosis not present

## 2021-06-10 DIAGNOSIS — Z7984 Long term (current) use of oral hypoglycemic drugs: Secondary | ICD-10-CM | POA: Diagnosis not present

## 2021-06-10 DIAGNOSIS — E1169 Type 2 diabetes mellitus with other specified complication: Secondary | ICD-10-CM | POA: Diagnosis not present

## 2021-07-22 DIAGNOSIS — G4733 Obstructive sleep apnea (adult) (pediatric): Secondary | ICD-10-CM | POA: Diagnosis not present

## 2021-07-29 ENCOUNTER — Encounter: Payer: Self-pay | Admitting: Podiatry

## 2021-07-29 ENCOUNTER — Ambulatory Visit (INDEPENDENT_AMBULATORY_CARE_PROVIDER_SITE_OTHER): Payer: Medicare Other | Admitting: Podiatry

## 2021-07-29 DIAGNOSIS — M79674 Pain in right toe(s): Secondary | ICD-10-CM | POA: Diagnosis not present

## 2021-07-29 DIAGNOSIS — E119 Type 2 diabetes mellitus without complications: Secondary | ICD-10-CM | POA: Diagnosis not present

## 2021-07-29 DIAGNOSIS — B351 Tinea unguium: Secondary | ICD-10-CM | POA: Diagnosis not present

## 2021-07-29 DIAGNOSIS — M79675 Pain in left toe(s): Secondary | ICD-10-CM

## 2021-08-05 NOTE — Progress Notes (Signed)
?  Subjective:  ?Patient ID: Joseph Clay, male    DOB: 25-Feb-1951,  MRN: [id number] ? ?Joseph Clay presents to clinic today for preventative diabetic foot care and painful elongated mycotic toenails 1-5 bilaterally which are tender when wearing enclosed shoe gear. Pain is relieved with periodic professional debridement. ? ?New problem(s): None.  ? ?PCP is Kirby Funk, MD , and last visit was June 10, 2021. ? ?No Known Allergies ? ?Review of Systems: Negative except as noted in the HPI. ? ?Objective: No changes noted in today's physical examination. ? ?Joseph Clay is a pleasant 71 y.o. male in NAD. AAO X 3. ? ?Vascular Examination: ?CFT immediate b/l LE. Palpable DP/PT pulses b/l LE. Digital hair sparse b/l. Skin temperature gradient WNL b/l. No pain with calf compression b/l. Trace edema b/l LE. No cyanosis or clubbing noted b/l LE. Evidence of chronic venous insufficiency b/l LE. ? ?Dermatological Examination: ?Pedal integument with normal turgor, texture and tone b/l LE. No open wounds b/l. No interdigital macerations b/l. Toenails 2-5 bilaterally and R hallux elongated, thickened, discolored with subungual debris. +Tenderness with dorsal palpation of nailplates. No hyperkeratotic or porokeratotic lesions present. Anonychia noted L hallux. Nailbed(s) epithelialized.  Evidence of chronic venous insufficiency b/l lower extremities. Early HKT submet head 5 b/l which do not require debridement. ? ?Musculoskeletal Examination: ?Normal muscle strength 5/5 to all lower extremity muscle groups bilaterally. No pain, crepitus or joint limitation noted with ROM b/l LE. No gross bony pedal deformities b/l. Patient ambulates independently without assistive aids. ? ?Neurological Examination: ?Protective sensation intact 5/5 intact bilaterally with 10g monofilament b/l. Vibratory sensation diminished b/l. Proprioception intact bilaterally. ? ?Assessment/Plan: ?1. Pain due to onychomycosis of toenails of both  feet   ?2. Type 2 diabetes mellitus without complication, without long-term current use of insulin (HCC)   ?  ?-Patient was evaluated and treated. All patient's and/or POA's questions/concerns answered on today's visit. ?-Continue diabetic foot care principles: inspect feet daily, monitor glucose as recommended by PCP and/or Endocrinologist, and follow prescribed diet per PCP, Endocrinologist and/or dietician. ?-Mycotic toenails 2-5 bilaterally and R hallux were debrided in length and girth with sterile nail nippers and dremel without iatrogenic bleeding. ?-Patient/POA to call should there be question/concern in the interim.  ? ?Return in about 3 months (around 10/28/2021). ? ?Freddie Breech, DPM  ?

## 2021-10-25 DIAGNOSIS — G4733 Obstructive sleep apnea (adult) (pediatric): Secondary | ICD-10-CM | POA: Diagnosis not present

## 2021-10-28 ENCOUNTER — Ambulatory Visit (INDEPENDENT_AMBULATORY_CARE_PROVIDER_SITE_OTHER): Payer: Medicare Other

## 2021-10-28 ENCOUNTER — Ambulatory Visit: Payer: Medicare Other | Admitting: Podiatry

## 2021-10-28 ENCOUNTER — Encounter: Payer: Self-pay | Admitting: Podiatry

## 2021-10-28 DIAGNOSIS — M79675 Pain in left toe(s): Secondary | ICD-10-CM

## 2021-10-28 NOTE — Progress Notes (Signed)
Subjective:   Patient ID: Joseph Clay, male   DOB: 71 y.o.   MRN: [id number]   HPI Patient presents stating he traumatized his big toe lost his nail and is concerned because there is some redness in the proximal fold.  States that its minimally tender and there has not been any drainage but he has not wanted it checked.  Patient does not smoke moderate obesity likes to be active   Review of Systems  All other systems reviewed and are negative.       Objective:  Physical Exam Vitals and nursing note reviewed.  Constitutional:      Appearance: He is well-developed.  Pulmonary:     Effort: Pulmonary effort is normal.  Musculoskeletal:        General: Normal range of motion.  Skin:    General: Skin is warm.  Neurological:     Mental Status: He is alert.     Neurovascular status intact muscle strength found to be adequate range of motion was within normal limits with no loss of sharp dull vibratory.  Patient does have trauma to the proximal nail fold left localized no distal erythema edema light looseness of the nailbed noted.  Good digital perfusion well oriented x3     Assessment:  Nailbed left that is moderately loose but intact with no indications of the infection     Plan:  H&P precautionary x-ray indicates that there is no signs of bone trauma appears to be soft tissue and went ahead today and I advised him on soaks and just wearing shoes with good toe box.  This should be uneventful and healing  X-rays indicate that there is no signs of proximal bone structure issues or indications of fracture

## 2021-10-29 ENCOUNTER — Ambulatory Visit: Payer: Medicare Other | Admitting: Podiatry

## 2021-11-04 ENCOUNTER — Encounter: Payer: Self-pay | Admitting: Podiatry

## 2021-11-04 ENCOUNTER — Ambulatory Visit: Payer: Medicare Other | Admitting: Podiatry

## 2021-11-04 ENCOUNTER — Ambulatory Visit (INDEPENDENT_AMBULATORY_CARE_PROVIDER_SITE_OTHER): Payer: Medicare Other | Admitting: Podiatry

## 2021-11-04 DIAGNOSIS — E119 Type 2 diabetes mellitus without complications: Secondary | ICD-10-CM

## 2021-11-04 DIAGNOSIS — M79675 Pain in left toe(s): Secondary | ICD-10-CM

## 2021-11-04 DIAGNOSIS — B351 Tinea unguium: Secondary | ICD-10-CM | POA: Diagnosis not present

## 2021-11-04 DIAGNOSIS — M79674 Pain in right toe(s): Secondary | ICD-10-CM | POA: Diagnosis not present

## 2021-11-04 NOTE — Progress Notes (Signed)
  Subjective:  Patient ID: Joseph Clay, male    DOB: 07-Jul-1950,  MRN: [id number]  Joseph Clay presents to clinic today for preventative diabetic foot care and painful thick toenails that are difficult to trim. Pain interferes with ambulation. Aggravating factors include wearing enclosed shoe gear. Pain is relieved with periodic professional debridement.  Patient states he saw Dr. Charlsie Merles last week to check left great toe because it was red. No acute problems were found.  PCP is Kirby Funk, MD , and last visit was  October 31, 2021  No Known Allergies  Review of Systems: Negative except as noted in the HPI.  Objective: No changes noted in today's physical examination.Objective: No changes noted in today's physical examination.  Joseph Clay is a pleasant 71 y.o. male in NAD. AAO X 3.  Vascular Examination: CFT immediate b/l LE. Palpable DP/PT pulses b/l LE. Digital hair sparse b/l. Skin temperature gradient WNL b/l. No pain with calf compression b/l. Trace edema b/l LE. No cyanosis or clubbing noted b/l LE. Evidence of chronic venous insufficiency b/l LE.  Dermatological Examination: Pedal integument with normal turgor, texture and tone b/l LE. No open wounds b/l. No interdigital macerations b/l. Toenails 2-5 bilaterally and R hallux elongated, thickened, discolored with subungual debris. +Tenderness with dorsal palpation of nailplates. No hyperkeratotic or porokeratotic lesions present.   There is noted onchyolysis of proximal 3/4 of left great toe nailplate.  The nailbed remains intact. There is no erythema, no edema, no drainage, no underlying fluctuance. He does have mild nail border hypertrophy of the proximal nailfold. No drainage expressed. No pain on palpation.  Evidence of chronic venous insufficiency b/l lower extremities. Early HKT submet head 5 b/l which do not require debridement.  Musculoskeletal Examination: Normal muscle strength 5/5 to all lower extremity  muscle groups bilaterally. No pain, crepitus or joint limitation noted with ROM b/l LE. No gross bony pedal deformities b/l. Patient ambulates independently without assistive aids.  Neurological Examination: Protective sensation intact 5/5 intact bilaterally with 10g monofilament b/l. Vibratory sensation diminished b/l. Proprioception intact bilaterally.  Assessment/Plan: 1. Pain due to onychomycosis of toenails of both feet   2. Type 2 diabetes mellitus without complication, without long-term current use of insulin (HCC)     -Patient was evaluated and treated. All patient's and/or POA's questions/concerns answered on today's visit. -I debrided the nail plate to level of adherence of left great toe. He does not have any acute problems of the left great toe today. He has had it removed in the past. It seems the nail does not want to adhere to the nailbed and I advised him he may need to have the toenail permanently removed in the future if he continues to have problems. He related understanding. -Patient to continue soft, supportive shoe gear daily. -Toenails 1-5 bilaterally debrided in length and girth without iatrogenic bleeding with sterile nail nipper and dremel.  -Patient/POA to call should there be question/concern in the interim.   Return in about 3 months (around 02/04/2022).  Freddie Breech, DPM

## 2021-11-21 DIAGNOSIS — H5203 Hypermetropia, bilateral: Secondary | ICD-10-CM | POA: Diagnosis not present

## 2021-12-26 ENCOUNTER — Ambulatory Visit (INDEPENDENT_AMBULATORY_CARE_PROVIDER_SITE_OTHER): Payer: Medicare Other

## 2021-12-26 ENCOUNTER — Encounter: Payer: Self-pay | Admitting: Podiatry

## 2021-12-26 ENCOUNTER — Ambulatory Visit: Payer: Medicare Other | Admitting: Podiatry

## 2021-12-26 DIAGNOSIS — N182 Chronic kidney disease, stage 2 (mild): Secondary | ICD-10-CM | POA: Diagnosis not present

## 2021-12-26 DIAGNOSIS — R600 Localized edema: Secondary | ICD-10-CM

## 2021-12-26 DIAGNOSIS — I10 Essential (primary) hypertension: Secondary | ICD-10-CM | POA: Diagnosis not present

## 2021-12-26 DIAGNOSIS — L03031 Cellulitis of right toe: Secondary | ICD-10-CM | POA: Diagnosis not present

## 2021-12-26 DIAGNOSIS — E1122 Type 2 diabetes mellitus with diabetic chronic kidney disease: Secondary | ICD-10-CM | POA: Diagnosis not present

## 2021-12-26 DIAGNOSIS — Z1331 Encounter for screening for depression: Secondary | ICD-10-CM | POA: Diagnosis not present

## 2021-12-26 DIAGNOSIS — Z0000 Encounter for general adult medical examination without abnormal findings: Secondary | ICD-10-CM | POA: Diagnosis not present

## 2021-12-26 DIAGNOSIS — E782 Mixed hyperlipidemia: Secondary | ICD-10-CM | POA: Diagnosis not present

## 2021-12-26 NOTE — Patient Instructions (Signed)

## 2021-12-28 NOTE — Progress Notes (Signed)
Subjective:   Patient ID: Joseph Clay, male   DOB: 71 y.o.   MRN: [id number]   HPI Patient complains of some discomfort of the lateral border of the right third digit and states that he gets his nails cut but that this has become moderately inflamed on this lateral side   ROS      Objective:  Physical Exam  Neurovascular status intact with patient found to have incurvated nailbeds bilateral with inflammation of the lateral third nailbed that is moderately painful with history of ingrown toenail deformity     Assessment:  Ingrown toenail deformity that is present that is related to overall nail health foot health      Plan:  Reviewed condition discussed at length and at this point organ to try conservative and we will begin soaks I removed and debrided out the corner advised on permanent procedure educated him on ingrown toenail correction but we will get a hold off currently.  Patient will be seen back if symptoms maintain or get worse and will require more aggressive like work

## 2022-01-13 ENCOUNTER — Encounter: Payer: Self-pay | Admitting: Podiatry

## 2022-01-13 ENCOUNTER — Ambulatory Visit (INDEPENDENT_AMBULATORY_CARE_PROVIDER_SITE_OTHER): Payer: Medicare Other | Admitting: Podiatry

## 2022-01-13 DIAGNOSIS — B351 Tinea unguium: Secondary | ICD-10-CM | POA: Diagnosis not present

## 2022-01-13 DIAGNOSIS — L84 Corns and callosities: Secondary | ICD-10-CM

## 2022-01-13 DIAGNOSIS — M79674 Pain in right toe(s): Secondary | ICD-10-CM

## 2022-01-13 DIAGNOSIS — M79675 Pain in left toe(s): Secondary | ICD-10-CM

## 2022-01-13 DIAGNOSIS — E119 Type 2 diabetes mellitus without complications: Secondary | ICD-10-CM | POA: Diagnosis not present

## 2022-01-13 NOTE — Progress Notes (Unsigned)
  Subjective:  Patient ID: Joseph Clay, male    DOB: 20-Aug-1950,  MRN: [id number]  Joseph Clay presents to clinic today for:  Chief Complaint  Patient presents with   Nail Problem    Diabeti foot care BS-did not check today A1C-6.3 PCP-[facility] PCP VST-Last week    New problem(s): None. {jgcomplaint:23593}  PCP is Lavone Orn, MD , and last visit was  {Time; dates multiple:15870}.  No Known Allergies  Review of Systems: Negative except as noted in the HPI.  Objective: No changes noted in today's physical examination.  Joseph Clay is a pleasant 71 y.o. male {jgbodyhabitus:24098} AAO x 3.  Vascular Examination: CFT immediate b/l LE. Palpable DP/PT pulses b/l LE. Digital hair sparse b/l. Skin temperature gradient WNL b/l. No pain with calf compression b/l. Trace edema b/l LE. No cyanosis or clubbing noted b/l LE. Evidence of chronic venous insufficiency b/l LE.  Dermatological Examination: Pedal integument with normal turgor, texture and tone b/l LE. No open wounds b/l. No interdigital macerations b/l. Toenails 2-5 bilaterally and R hallux elongated, thickened, discolored with subungual debris. +Tenderness with dorsal palpation of nailplates. No hyperkeratotic or porokeratotic lesions present.   There is noted onchyolysis of proximal 3/4 of left great toe nailplate.  The nailbed remains intact. There is no erythema, no edema, no drainage, no underlying fluctuance. He does have mild nail border hypertrophy of the proximal nailfold. No drainage expressed. No pain on palpation.  Evidence of chronic venous insufficiency b/l lower extremities. Early HKT submet head 5 b/l which do not require debridement.  Musculoskeletal Examination: Normal muscle strength 5/5 to all lower extremity muscle groups bilaterally. No pain, crepitus or joint limitation noted with ROM b/l LE. No gross bony pedal deformities b/l. Patient ambulates independently without assistive  aids.  Neurological Examination: Protective sensation intact 5/5 intact bilaterally with 10g monofilament b/l. Vibratory sensation diminished b/l. Proprioception intact bilaterally. Assessment/Plan: No diagnosis found.  No orders of the defined types were placed in this encounter.   {Jgplan:23602::"-Patient/POA to call should there be question/concern in the interim."}   Return in about 3 months (around 04/15/2022).  Marzetta Board, DPM

## 2022-01-23 DIAGNOSIS — G4733 Obstructive sleep apnea (adult) (pediatric): Secondary | ICD-10-CM | POA: Diagnosis not present

## 2022-01-25 DIAGNOSIS — Z23 Encounter for immunization: Secondary | ICD-10-CM | POA: Diagnosis not present

## 2022-04-22 ENCOUNTER — Encounter: Payer: Self-pay | Admitting: Podiatry

## 2022-04-22 ENCOUNTER — Ambulatory Visit (INDEPENDENT_AMBULATORY_CARE_PROVIDER_SITE_OTHER): Payer: Medicare Other | Admitting: Podiatry

## 2022-04-22 VITALS — BP 129/68

## 2022-04-22 DIAGNOSIS — E1142 Type 2 diabetes mellitus with diabetic polyneuropathy: Secondary | ICD-10-CM

## 2022-04-22 DIAGNOSIS — I872 Venous insufficiency (chronic) (peripheral): Secondary | ICD-10-CM | POA: Diagnosis not present

## 2022-04-22 DIAGNOSIS — L84 Corns and callosities: Secondary | ICD-10-CM | POA: Diagnosis not present

## 2022-04-22 DIAGNOSIS — B351 Tinea unguium: Secondary | ICD-10-CM

## 2022-04-22 DIAGNOSIS — M79675 Pain in left toe(s): Secondary | ICD-10-CM

## 2022-04-22 DIAGNOSIS — M79674 Pain in right toe(s): Secondary | ICD-10-CM

## 2022-04-22 DIAGNOSIS — E119 Type 2 diabetes mellitus without complications: Secondary | ICD-10-CM | POA: Diagnosis not present

## 2022-04-22 NOTE — Progress Notes (Signed)
ANNUAL DIABETIC FOOT EXAM  Subjective: Joseph Clay presents today for annual diabetic foot examination.  Chief Complaint  Patient presents with   Nail Problem    Prediabetic BS- Did not check A1C-6.3 PCP-Schwartzman, Neil PCP VST-02/2022    Patient confirms h/o diabetes.  Patient relates 6 year h/o diabetes.  Patient denies any h/o foot wounds.   Patient admits symptoms of foot tingling.  Risk factors: diabetes, HTN, COPD, hyperlipidemia, h/o tobacco use in remission.  Joya Martyr, MD is patient's PCP.   Past Medical History:  Diagnosis Date   Arthritis    Bronchitis    couple of months ago   Chronic back pain    bulding disc   Chronic kidney disease    bladder outlet obstruction-  RECENT HOSPITALIZATION WITH ACUTE RENAL FAILURE 2/13-      recent scans in EP[facility] from 2/13-   Diabetes mellitus    takes Metformin bid   Hx of colonic polyps    Hyperlipidemia    takes Pravastatin daily   Hypertension    EKG, Chest 2/13 EP[facility]- followed by Dr Roseanne Reno, labs 05/31/11  epic   Joint pain    right shoulder and right knee   Sleep apnea    CPAP-sleep study done 7months ago/ UNABLE TO LOCATE REPORT /  states [facility]- doesnt know settings   Patient Active Problem List   Diagnosis Date Noted   Body mass index (BMI) 34.0-34.9, adult 06/12/2020   Chronic obstructive pulmonary disease, unspecified (HCC) 06/12/2020   Edema 06/12/2020   Erectile dysfunction 06/12/2020   Extreme obesity with alveolar hypoventilation (HCC) 06/12/2020   Hardening of the aorta (main artery of the heart) (HCC) 06/12/2020   Hematuria 06/12/2020   Mixed hyperlipidemia 06/12/2020   Morton's neuroma of left foot 06/12/2020   Obstructive sleep apnea syndrome 06/12/2020   Personal history of colonic polyps 06/12/2020   Supraventricular premature beats 06/12/2020   Benign prostatic hyperplasia 03/18/2018   Sepsis (HCC) 05/23/2015   Bacteremia 05/23/2015   Cholangiolitis 05/23/2015    Choledocholithiasis 05/23/2015   Lactic acidosis 05/15/2015   Hyperbilirubinemia 05/15/2015   Transaminitis 05/15/2015   ARF (acute renal failure) (HCC) 05/31/2011   Bladder outlet obstruction 05/31/2011   HTN (hypertension) 05/31/2011   DM type 2 (diabetes mellitus, type 2) (HCC) 05/31/2011   Past Surgical History:  Procedure Laterality Date   CHOLECYSTECTOMY N/A 05/21/2015   Procedure: LAPAROSCOPIC CHOLECYSTECTOMY WITH INTRAOPERATIVE CHOLANGIOGRAM, BIOPSY RIGHT LOBE OF LIVER;  Surgeon: Ovidio Kin, MD;  Location: [facility];  Service: General;  Laterality: N/A;   colonscopy     CYSTOSCOPY  06/16/2011   Procedure: Bluford Kaufmann;  Surgeon: Crecencio Mc, MD;  Location: [facility];  Service: Urology;  Laterality: N/A;        ERCP N/A 05/17/2015   Procedure: ENDOSCOPIC RETROGRADE CHOLANGIOPANCREATOGRAPHY (ERCP);  Surgeon: Vida Rigger, MD;  Location: [facility];  Service: Endoscopy;  Laterality: N/A;   RT shoulder surgery     bone spur,scrape rotor cuff   SHOULDER ARTHROSCOPY     right   05/23/11/   [facility]   TONSILLECTOMY     as a child   TRANSURETHRAL RESECTION OF PROSTATE  06/16/2011   Procedure: TRANSURETHRAL RESECTION OF THE PROSTATE (TURP);  Surgeon: Crecencio Mc, MD;  Location: [facility];  Service: Urology;  Laterality: N/A;   TRANSURETHRAL RESECTION OF PROSTATE     03-18-18    TRANSURETHRAL RESECTION OF PROSTATE N/A 03/18/2018   Procedure: TRANSURETHRAL RESECTION OF THE PROSTATE (TURP), CYSTOLITHALOPAXY  WITH HOLMIUM LASER, CYSTOSCOPY;  Surgeon: Heloise Purpura, MD;  Location: [facility];  Service: Urology;  Laterality: N/A;   Current Outpatient Medications on File Prior to Visit  Medication Sig Dispense Refill   amLODipine (NORVASC) 10 MG tablet Take 1 tablet by mouth daily.     amLODipine (NORVASC) 5 MG tablet Take 5 mg by mouth daily.      chlorhexidine (PERIDEX) 0.12 % solution SMARTSIG:15 Milliliter(s) By Mouth 3 Times Daily     chlorthalidone (HYGROTON) 25 MG tablet Take 25 mg by mouth daily.      furosemide (LASIX) 40 MG tablet Take 40 mg by mouth daily.      ibuprofen (ADVIL) 800 MG tablet Take 800 mg by mouth 3 (three) times daily as needed.     lisinopril (PRINIVIL,ZESTRIL) 20 MG tablet Take 20 mg by mouth daily.     metFORMIN (GLUCOPHAGE) 1000 MG tablet Take 1,000 mg by mouth 2 (two) times daily.  0   ONETOUCH ULTRA test strip SMARTSIG:1 Strip(s) Via Meter Morning-Evening     OZEMPIC, 0.25 OR 0.5 MG/DOSE, 2 MG/1.5ML SOPN Inject 0.5 mg into the skin once a week.     potassium chloride SA (K-DUR,KLOR-CON) 20 MEQ tablet Take 20 mEq by mouth daily.      pravastatin (PRAVACHOL) 20 MG tablet Take 20 mg by mouth daily.     telmisartan (MICARDIS) 80 MG tablet Take 80 mg by mouth daily.     No current facility-administered medications on file prior to visit.    No Known Allergies Social History   Occupational History   Not on file  Tobacco Use   Smoking status: Former   Smokeless tobacco: Never   Tobacco comments:    quit 20+yrs ago  Vaping Use   Vaping Use: Never used  Substance and Sexual Activity   Alcohol use: No    Comment: occasionally    Drug use: No   Sexual activity: Never   Family History  Problem Relation Age of Onset   Anesthesia problems Neg Hx    Hypotension Neg Hx    Malignant hyperthermia Neg Hx    Pseudochol deficiency Neg Hx    Immunization History  Administered Date(s) Administered   Influenza Split 02/16/2009, 02/21/2010, 02/27/2011, 01/09/2012, 01/17/2013, 02/12/2018   Influenza, High Dose Seasonal PF 01/24/2014, 02/05/2015, 01/24/2016, 01/07/2017, 01/19/2019, 01/18/2020   Moderna Sars-Covid-2 Vaccination 05/16/2019, 06/14/2019, 02/07/2020   Pneumococcal Conjugate-13 01/24/2014   Pneumococcal Polysaccharide-23 12/23/2006, 02/05/2015   Td 08/06/2015   Tdap 12/08/2005   Zoster, Live 07/08/2012, 05/18/2018, 12/06/2018     Review of Systems: Negative except as noted in the HPI.   Objective: Vitals:   04/22/22 0908  BP: 129/68   Joseph Clay is a pleasant 72 y.o. male in NAD. AAO X 3.  Vascular Examination: Capillary refill time to digits immediate b/l. Palpable DP pulse(s) b/l LE. Diminished PT pulse(s) b/l LE. Pedal hair sparse. No pain with calf compression b/l. Lower extremity skin temperature gradient within normal limits. Trace edema noted BLE. Mild varicosities b/l LE. Evidence of chronic venous insufficiency b/l LE. No cyanosis or clubbing noted b/l LE.  Dermatological Examination: Pedal skin is warm and supple b/l LE. No open wounds b/l LE. No interdigital macerations noted b/l LE. Toenails 1-5 b/l elongated, discolored, dystrophic, thickened, crumbly with subungual debris and tenderness to dorsal palpation. Mild hyperkeratosis submet heads 1, 5 b/l.  Neurological Examination: Protective sensation intact 5/5 intact bilaterally with 10g monofilament b/l. Vibratory sensation diminished b/l.  Musculoskeletal Examination: Normal muscle strength 5/5 to all lower extremity muscle groups bilaterally. Tailor's bunion deformity noted b/l LE.Marland Kitchen No pain, crepitus or joint limitation noted with ROM b/l LE.  Patient ambulates independently without assistive aids.  Footwear Assessment: Does the patient wear appropriate shoes? Yes. Does the patient need inserts/orthotics? Yes.  ADA Risk Categorization: High Risk  Patient has one or more of the following: Loss of protective sensation Absent pedal pulses Severe Foot deformity History of foot ulcer  Assessment: 1. Pain due to onychomycosis of toenails of both feet   2. Callus   3. Chronic venous insufficiency   4. Diabetic peripheral neuropathy associated with type 2 diabetes mellitus ([place])   5. Encounter for diabetic foot exam [facility])    Plan:  -Patient was evaluated and treated. All patient's and/or POA's questions/concerns answered on today's visit. -Diabetic foot examination performed today. -Continue foot and shoe inspections daily. Monitor blood glucose per  PCP/Endocrinologist's recommendations. -Continue supportive shoe gear daily. -Toenails 1-5 b/l were debrided in length and girth with sterile nail nippers and dremel without iatrogenic bleeding.  -Hyperkeratotic lesion(s) submet head 1 b/l and submet head 5 b/l debrided with dremel bur without incident. Total number debrided=4. -Patient/POA to call should there be question/concern in the interim.  Return in about 3 months (around 07/22/2022).  Marzetta Board, DPM

## 2022-06-12 DIAGNOSIS — G4733 Obstructive sleep apnea (adult) (pediatric): Secondary | ICD-10-CM | POA: Diagnosis not present

## 2022-06-20 DIAGNOSIS — J449 Chronic obstructive pulmonary disease, unspecified: Secondary | ICD-10-CM | POA: Diagnosis not present

## 2022-06-20 DIAGNOSIS — J0100 Acute maxillary sinusitis, unspecified: Secondary | ICD-10-CM | POA: Diagnosis not present

## 2022-06-20 DIAGNOSIS — J40 Bronchitis, not specified as acute or chronic: Secondary | ICD-10-CM | POA: Diagnosis not present

## 2022-07-14 DIAGNOSIS — I10 Essential (primary) hypertension: Secondary | ICD-10-CM | POA: Diagnosis not present

## 2022-07-14 DIAGNOSIS — E1122 Type 2 diabetes mellitus with diabetic chronic kidney disease: Secondary | ICD-10-CM | POA: Diagnosis not present

## 2022-07-14 DIAGNOSIS — E662 Morbid (severe) obesity with alveolar hypoventilation: Secondary | ICD-10-CM | POA: Diagnosis not present

## 2022-07-14 DIAGNOSIS — E1142 Type 2 diabetes mellitus with diabetic polyneuropathy: Secondary | ICD-10-CM | POA: Diagnosis not present

## 2022-07-14 DIAGNOSIS — I700 Atherosclerosis of aorta: Secondary | ICD-10-CM | POA: Diagnosis not present

## 2022-08-04 ENCOUNTER — Encounter: Payer: Self-pay | Admitting: Podiatry

## 2022-08-04 ENCOUNTER — Ambulatory Visit (INDEPENDENT_AMBULATORY_CARE_PROVIDER_SITE_OTHER): Payer: Medicare Other | Admitting: Podiatry

## 2022-08-04 VITALS — BP 138/72

## 2022-08-04 DIAGNOSIS — M79675 Pain in left toe(s): Secondary | ICD-10-CM

## 2022-08-04 DIAGNOSIS — B351 Tinea unguium: Secondary | ICD-10-CM

## 2022-08-04 DIAGNOSIS — E1142 Type 2 diabetes mellitus with diabetic polyneuropathy: Secondary | ICD-10-CM | POA: Diagnosis not present

## 2022-08-04 DIAGNOSIS — L84 Corns and callosities: Secondary | ICD-10-CM

## 2022-08-04 DIAGNOSIS — M79674 Pain in right toe(s): Secondary | ICD-10-CM

## 2022-08-04 NOTE — Progress Notes (Signed)
  Subjective:  Patient ID: Joseph Clay, male    DOB: [birth year],  MRN: [id number]  Joseph Clay presents to clinic today for at risk foot care with history of diabetic neuropathy and callus(es) both feet and painful thick toenails that are difficult to trim. Painful toenails interfere with ambulation. Aggravating factors include wearing enclosed shoe gear. Pain is relieved with periodic professional debridement. Painful calluses are aggravated when weightbearing with and without shoegear. Pain is relieved with periodic professional debridement.  Chief Complaint  Patient presents with   Nail Problem    [facility] BS- did not check today A1C-6.6 PCP-Schwartman PCP VST- 2 weeks ago   New problem(s): None.   PCP is Heywood Iles, Loretha Brasil, MD (Inactive).  No Known Allergies  Review of Systems: Negative except as noted in the HPI.  Objective: No changes noted in today's physical examination. Vitals:   08/04/22 0851  BP: 138/72   Joseph Clay is a pleasant 72 y.o. male obese in NAD. AAO x 3.  Vascular Examination: Capillary refill time to digits immediate b/l. Palpable DP pulse(s) b/l LE. Diminished PT pulse(s) b/l LE. Pedal hair sparse. No pain with calf compression b/l. Lower extremity skin temperature gradient within normal limits. Trace edema noted BLE. Mild varicosities b/l LE. Evidence of chronic venous insufficiency b/l LE. No cyanosis or clubbing noted b/l LE.  Dermatological Examination: Pedal skin is warm and supple b/l LE. No open wounds b/l LE. No interdigital macerations noted b/l LE. Toenails 1-5 b/l elongated, discolored, dystrophic, thickened, crumbly with subungual debris and tenderness to dorsal palpation.   Mild hyperkeratosis submet heads 1, 5 b/l.  Neurological Examination: Protective sensation intact 5/5 intact bilaterally with 10g monofilament b/l. Vibratory sensation diminished b/l.  Musculoskeletal Examination: Normal muscle strength 5/5 to all lower  extremity muscle groups bilaterally. Tailor's bunion deformity noted b/l LE.Marland Kitchen No pain, crepitus or joint limitation noted with ROM b/l LE.  Patient ambulates independently without assistive aids.  Assessment/Plan: 1. Pain due to onychomycosis of toenails of both feet   2. Callus   3. Diabetic peripheral neuropathy associated with type 2 diabetes mellitus     -Consent given for treatment as described below: -Examined patient. -Patient to continue soft, supportive shoe gear daily. -Mycotic toenails 1-5 bilaterally were debrided in length and girth with sterile nail nippers and dremel without incident. -Callus(es) submet head 5 left foot pared utilizing sharp debridement with sterile blade without complication or incident. Total number debrided =1. -Callus(es) submet head 1 b/l and submet head 5 right foot pared utilizing rotary bur without complication or incident. Total number pared =4. -Patient/POA to call should there be question/concern in the interim.   Return in about 3 months (around 11/03/2022).  Joseph Clay, DPM

## 2022-10-21 DIAGNOSIS — G4733 Obstructive sleep apnea (adult) (pediatric): Secondary | ICD-10-CM | POA: Diagnosis not present

## 2022-11-03 ENCOUNTER — Ambulatory Visit (INDEPENDENT_AMBULATORY_CARE_PROVIDER_SITE_OTHER): Payer: Medicare Other | Admitting: Podiatry

## 2022-11-03 DIAGNOSIS — B351 Tinea unguium: Secondary | ICD-10-CM

## 2022-11-03 DIAGNOSIS — M79674 Pain in right toe(s): Secondary | ICD-10-CM | POA: Diagnosis not present

## 2022-11-03 DIAGNOSIS — M79675 Pain in left toe(s): Secondary | ICD-10-CM

## 2022-11-03 NOTE — Progress Notes (Signed)
       Subjective:  Patient ID: Joseph Clay, male    DOB: Mar 26, 1951,  MRN: [id number]   Joseph Clay presents to clinic today for:  Chief Complaint  Patient presents with   Nail Problem    [facility]  . Patient notes nails are thick, discolored, elongated and painful in shoegear when trying to ambulate.  Patient notes he does occasionally get some tingling in the toes.  Denies any constant numbness and states it is not worse at any time of the day.  PCP is Joseph Martyr, MD.  No Known Allergies  Review of Systems: Negative except as noted in the HPI.  Objective:  There were no vitals filed for this visit.  ABDULKARIM Clay is a pleasant 72 y.o. male in NAD. AAO x 3.  Vascular Examination: Capillary refill time is 3-5 seconds to toes bilateral. Palpable pedal pulses b/l LE. Digital hair present b/l. Skin temperature gradient WNL b/l.  No cyanosis or clubbing noted b/l.  There is hemosiderin position in the lower one third aspect of both legs.  There is +1 pitting edema bilateral legs and ankles.  Dermatological Examination: Pedal skin with normal turgor, texture and tone b/l. No open wounds. No interdigital macerations b/l. Toenails x10 are 3mm thick, discolored, dystrophic with subungual debris. There is pain with compression of the nail plates.  They are elongated x10  Neurological Examination: Protective sensation intact with Semmes Weinstein monofilament bilateral LE.   Assessment/Plan: 1. Pain due to onychomycosis of toenails of both feet     The mycotic toenails were sharply debrided x10 with sterile nail nippers and a power debriding burr to decrease bulk/thickness and length.    Return in about 3 months (around 02/03/2023) for [facility]   Joseph Clay, DPM, FACFAS [facility]     [address] [place], [place] [address]                Office [phone]  Fax [phone]

## 2022-11-25 DIAGNOSIS — H52203 Unspecified astigmatism, bilateral: Secondary | ICD-10-CM | POA: Diagnosis not present

## 2022-12-01 DIAGNOSIS — M545 Low back pain, unspecified: Secondary | ICD-10-CM | POA: Diagnosis not present

## 2022-12-01 DIAGNOSIS — M25551 Pain in right hip: Secondary | ICD-10-CM | POA: Diagnosis not present

## 2022-12-10 DIAGNOSIS — M25651 Stiffness of right hip, not elsewhere classified: Secondary | ICD-10-CM | POA: Diagnosis not present

## 2022-12-10 DIAGNOSIS — M1611 Unilateral primary osteoarthritis, right hip: Secondary | ICD-10-CM | POA: Diagnosis not present

## 2022-12-10 DIAGNOSIS — R531 Weakness: Secondary | ICD-10-CM | POA: Diagnosis not present

## 2022-12-17 DIAGNOSIS — M1611 Unilateral primary osteoarthritis, right hip: Secondary | ICD-10-CM | POA: Diagnosis not present

## 2022-12-17 DIAGNOSIS — R531 Weakness: Secondary | ICD-10-CM | POA: Diagnosis not present

## 2022-12-17 DIAGNOSIS — M25651 Stiffness of right hip, not elsewhere classified: Secondary | ICD-10-CM | POA: Diagnosis not present

## 2023-01-01 DIAGNOSIS — Z23 Encounter for immunization: Secondary | ICD-10-CM | POA: Diagnosis not present

## 2023-01-01 DIAGNOSIS — Z0000 Encounter for general adult medical examination without abnormal findings: Secondary | ICD-10-CM | POA: Diagnosis not present

## 2023-01-01 DIAGNOSIS — E1142 Type 2 diabetes mellitus with diabetic polyneuropathy: Secondary | ICD-10-CM | POA: Diagnosis not present

## 2023-01-01 DIAGNOSIS — I10 Essential (primary) hypertension: Secondary | ICD-10-CM | POA: Diagnosis not present

## 2023-01-01 DIAGNOSIS — E0869 Diabetes mellitus due to underlying condition with other specified complication: Secondary | ICD-10-CM | POA: Diagnosis not present

## 2023-01-01 DIAGNOSIS — E662 Morbid (severe) obesity with alveolar hypoventilation: Secondary | ICD-10-CM | POA: Diagnosis not present

## 2023-01-01 DIAGNOSIS — E782 Mixed hyperlipidemia: Secondary | ICD-10-CM | POA: Diagnosis not present

## 2023-01-01 DIAGNOSIS — E1122 Type 2 diabetes mellitus with diabetic chronic kidney disease: Secondary | ICD-10-CM | POA: Diagnosis not present

## 2023-01-01 DIAGNOSIS — Z1331 Encounter for screening for depression: Secondary | ICD-10-CM | POA: Diagnosis not present

## 2023-01-05 DIAGNOSIS — R531 Weakness: Secondary | ICD-10-CM | POA: Diagnosis not present

## 2023-01-05 DIAGNOSIS — M1611 Unilateral primary osteoarthritis, right hip: Secondary | ICD-10-CM | POA: Diagnosis not present

## 2023-01-26 DIAGNOSIS — G4733 Obstructive sleep apnea (adult) (pediatric): Secondary | ICD-10-CM | POA: Diagnosis not present

## 2023-02-04 ENCOUNTER — Encounter: Payer: Self-pay | Admitting: Podiatry

## 2023-02-04 ENCOUNTER — Ambulatory Visit (INDEPENDENT_AMBULATORY_CARE_PROVIDER_SITE_OTHER): Payer: Medicare Other | Admitting: Podiatry

## 2023-02-04 DIAGNOSIS — M79675 Pain in left toe(s): Secondary | ICD-10-CM | POA: Diagnosis not present

## 2023-02-04 DIAGNOSIS — M79674 Pain in right toe(s): Secondary | ICD-10-CM

## 2023-02-04 DIAGNOSIS — E1142 Type 2 diabetes mellitus with diabetic polyneuropathy: Secondary | ICD-10-CM | POA: Diagnosis not present

## 2023-02-04 DIAGNOSIS — B351 Tinea unguium: Secondary | ICD-10-CM | POA: Diagnosis not present

## 2023-02-09 NOTE — Progress Notes (Signed)
  Subjective:  Patient ID: [name], male    DOB: [birth year],  MRN: [id number]  72 y.o. male presents at risk foot care with history of diabetic neuropathy and painful thick toenails that are difficult to trim. Pain interferes with ambulation. Aggravating factors include wearing enclosed shoe gear. Pain is relieved with periodic professional debridement.  Chief Complaint  Patient presents with   [facility]    Rm# 16 [facility] BS-not taken this am A1C-6.6 01/2023 PCP-next appt 12/24    New problem(s): None   PCP is Schwartzman, Loretha Brasil, MD , and last visit was   No Known Allergies  Review of Systems: Negative except as noted in the HPI.   Objective:  Joseph Clay is a pleasant 72 y.o. male obese in NAD.Marland Kitchen AAO x 3.  Vascular Examination: Vascular status intact b/l with palpable pedal pulses. CFT immediate b/l. Pedal hair present. No edema. No pain with calf compression b/l. Skin temperature gradient WNL b/l. No varicosities noted. No cyanosis or clubbing noted.  Neurological Examination: Pt has subjective symptoms of neuropathy. Sensation grossly intact b/l with 10 gram monofilament. Vibratory sensation intact b/l.  Dermatological Examination: Pedal skin with normal turgor, texture and tone b/l. No open wounds nor interdigital macerations noted. Toenails 1-5 b/l thick, discolored, elongated with subungual debris and pain on dorsal palpation. No hyperkeratotic lesions noted b/l.   There is noted onchyolysis of nailplate of left second digit and left third digit.  The nailbed(s) remain(s) intact. There is no erythema, no edema, no drainage, no underlying fluctuance.  Musculoskeletal Examination: Muscle strength 5/5 to b/l LE.  No pain, crepitus noted b/l. No gross pedal deformities. Patient ambulates independently without assistive aids.   Radiographs: None  Last A1c:       No data to display           Assessment:   1. Pain due to onychomycosis of toenails of both feet    2. Onycholysis of toenail   3. Diabetic peripheral neuropathy associated with type 2 diabetes mellitus (HCC)    Plan:  -Consent given for treatment as described below: -Examined patient. -Continue foot and shoe inspections daily. Monitor blood glucose per PCP/Endocrinologist's recommendations. -Patient to continue soft, supportive shoe gear daily. -Toenails were debrided in length and girth 1-5 right foot, L hallux, L 4th toe, and L 5th toe with sterile nail nippers and dremel without iatrogenic bleeding.  -Loose nailplate left second digit and left third digit gently debrided en toto. Digital nailbed cleansed with alcohol. Triple antibiotic ointment applied to nailbed followed by light dressing. No further treatment required by patient. -Patient/POA to call should there be question/concern in the interim.  Return in about 3 months (around 05/07/2023).  Joseph Clay, DPM

## 2023-05-20 ENCOUNTER — Encounter: Payer: Self-pay | Admitting: Podiatry

## 2023-05-20 ENCOUNTER — Ambulatory Visit (INDEPENDENT_AMBULATORY_CARE_PROVIDER_SITE_OTHER): Payer: Medicare Other | Admitting: Podiatry

## 2023-05-20 VITALS — Ht 74.0 in | Wt 283.0 lb

## 2023-05-20 DIAGNOSIS — E119 Type 2 diabetes mellitus without complications: Secondary | ICD-10-CM

## 2023-05-20 DIAGNOSIS — I872 Venous insufficiency (chronic) (peripheral): Secondary | ICD-10-CM

## 2023-05-20 DIAGNOSIS — M79675 Pain in left toe(s): Secondary | ICD-10-CM | POA: Diagnosis not present

## 2023-05-20 DIAGNOSIS — M79674 Pain in right toe(s): Secondary | ICD-10-CM

## 2023-05-20 DIAGNOSIS — E1142 Type 2 diabetes mellitus with diabetic polyneuropathy: Secondary | ICD-10-CM | POA: Diagnosis not present

## 2023-05-20 DIAGNOSIS — B351 Tinea unguium: Secondary | ICD-10-CM | POA: Diagnosis not present

## 2023-05-20 DIAGNOSIS — G4733 Obstructive sleep apnea (adult) (pediatric): Secondary | ICD-10-CM | POA: Diagnosis not present

## 2023-05-27 NOTE — Progress Notes (Signed)
 ANNUAL DIABETIC FOOT EXAM  Subjective: Joseph Clay presents today for annual diabetic foot exam. Chief Complaint  Patient presents with   [facility]    He is here for nail trim, LAst A1C was 6.8, PCP is  indian name   Patient confirms h/o diabetes.  Patient denies any h/o foot wounds.  Patient has been diagnosed with neuropathy.  No primary care provider on file. is patient's PCP.  Past Medical History:  Diagnosis Date   Arthritis    Bronchitis    couple of months ago   Chronic back pain    bulding disc   Chronic kidney disease    bladder outlet obstruction-  RECENT HOSPITALIZATION WITH ACUTE RENAL FAILURE 2/13-      recent scans in EPIC from 2/13-   Diabetes mellitus    takes Metformin  bid   Hx of colonic polyps    Hyperlipidemia    takes Pravastatin  daily   Hypertension    EKG, Chest 2/13 EPIC- followed by Dr JINNY Lewis, labs 05/31/11  epic   Joint pain    right shoulder and right knee   Sleep apnea    CPAP-sleep study done 2months ago/ UNABLE TO LOCATE REPORT /  states SE[facility]ERE- doesnt know settings   Patient Active Problem List   Diagnosis Date Noted   Polyneuropathy due to type 2 diabetes mellitus (HCC) 08/04/2022   Body mass index (BMI) 34.0-34.9, adult 06/12/2020   Chronic obstructive pulmonary disease, unspecified (HCC) 06/12/2020   Edema 06/12/2020   Erectile dysfunction 06/12/2020   Extreme obesity with alveolar hypoventilation (HCC) 06/12/2020   Hardening of the aorta (main artery of the heart) (HCC) 06/12/2020   Hematuria 06/12/2020   Mixed hyperlipidemia 06/12/2020   Morton's neuroma of left foot 06/12/2020   Obstructive sleep apnea syndrome 06/12/2020   History of colonic polyps 06/12/2020   Supraventricular premature beats 06/12/2020   Benign prostatic hyperplasia 03/18/2018   Sepsis (HCC) 05/23/2015   Bacteremia 05/23/2015   Cholangiolitis 05/23/2015   Choledocholithiasis 05/23/2015   Lactic acidosis 05/15/2015   Hyperbilirubinemia  05/15/2015   Transaminitis 05/15/2015   ARF (acute renal failure) (HCC) 05/31/2011   Obstruction to urinary outflow 05/31/2011   HTN (hypertension) 05/31/2011   DM type 2 (diabetes mellitus, type 2) (HCC) 05/31/2011   Past Surgical History:  Procedure Laterality Date   CHOLECYSTECTOMY N/A 05/21/2015   Procedure: LAPAROSCOPIC CHOLECYSTECTOMY WITH INTRAOPERATIVE CHOLANGIOGRAM, BIOPSY RIGHT LOBE OF LIVER;  Surgeon: Alm Angle, MD;  Location: [facility];  Service: General;  Laterality: N/A;   colonscopy     CYSTOSCOPY  06/16/2011   Procedure: PHYLLIS;  Surgeon: Noretta Ferrara, MD;  Location: [facility];  Service: Urology;  Laterality: N/A;        ERCP N/A 05/17/2015   Procedure: ENDOSCOPIC RETROGRADE CHOLANGIOPANCREATOGRAPHY (ERCP);  Surgeon: Oliva Boots, MD;  Location: [facility];  Service: Endoscopy;  Laterality: N/A;   RT shoulder surgery     bone spur,scrape rotor cuff   SHOULDER ARTHROSCOPY     right   05/23/11/   [place]   TONSILLECTOMY     as a child   TRANSURETHRAL RESECTION OF PROSTATE  06/16/2011   Procedure: TRANSURETHRAL RESECTION OF THE PROSTATE (TURP);  Surgeon: Noretta Ferrara, MD;  Location: [facility];  Service: Urology;  Laterality: N/A;   TRANSURETHRAL RESECTION OF PROSTATE     03-18-18    TRANSURETHRAL RESECTION OF PROSTATE N/A 03/18/2018   Procedure: TRANSURETHRAL RESECTION OF THE PROSTATE (TURP), CYSTOLITHALOPAXY WITH HOLMIUM LASER, CYSTOSCOPY;  Surgeon: Renda Glance, MD;  Location: [facility];  Service: Urology;  Laterality: N/A;   Current Outpatient Medications on File Prior to Visit  Medication Sig Dispense Refill   amLODipine  (NORVASC ) 10 MG tablet Take 1 tablet by mouth daily.     amLODipine  (NORVASC ) 5 MG tablet Take 5 mg by mouth daily.      chlorhexidine  (PERIDEX ) 0.12 % solution SMARTSIG:15 Milliliter(s) By Mouth 3 Times Daily     chlorthalidone (HYGROTON) 25 MG tablet Take 25 mg by mouth daily.     furosemide  (LASIX ) 40 MG tablet Take 40 mg by mouth daily.      ibuprofen   (ADVIL ) 800 MG tablet Take 800 mg by mouth 3 (three) times daily as needed.     lisinopril  (PRINIVIL ,ZESTRIL ) 20 MG tablet Take 20 mg by mouth daily.     metFORMIN  (GLUCOPHAGE ) 1000 MG tablet Take 1,000 mg by mouth 2 (two) times daily.  0   ONETOUCH ULTRA test strip SMARTSIG:1 Strip(s) Via Meter Morning-Evening     OZEMPIC, 0.25 OR 0.5 MG/DOSE, 2 MG/1.5ML SOPN Inject 0.5 mg into the skin once a week.     potassium chloride  SA (K-DUR,KLOR-CON ) 20 MEQ tablet Take 20 mEq by mouth daily.      pravastatin  (PRAVACHOL ) 20 MG tablet Take 20 mg by mouth daily.     telmisartan (MICARDIS) 80 MG tablet Take 80 mg by mouth daily.     No current facility-administered medications on file prior to visit.    No Known Allergies Social History   Occupational History   Not on file  Tobacco Use   Smoking status: Former   Smokeless tobacco: Never   Tobacco comments:    quit 20+yrs ago  Vaping Use   Vaping status: Never Used  Substance and Sexual Activity   Alcohol use: No    Comment: occasionally    Drug use: No   Sexual activity: Never   Family History  Problem Relation Age of Onset   Anesthesia problems Neg Hx    Hypotension Neg Hx    Malignant hyperthermia Neg Hx    Pseudochol deficiency Neg Hx    Immunization History  Administered Date(s) Administered   Influenza Split 02/16/2009, 02/21/2010, 02/27/2011, 01/09/2012, 01/17/2013, 02/12/2018   Influenza, High Dose Seasonal PF 01/24/2014, 02/05/2015, 01/24/2016, 01/07/2017, 01/19/2019, 01/18/2020   Moderna Sars-Covid-2 Vaccination 05/16/2019, 06/14/2019, 02/07/2020   Pneumococcal Conjugate-13 01/24/2014   Pneumococcal Polysaccharide-23 12/23/2006, 02/05/2015   Td 08/06/2015   Tdap 12/08/2005   Zoster, Live 07/08/2012, 05/18/2018, 12/06/2018     Review of Systems: Negative except as noted in the HPI.   Objective: There were no vitals filed for this visit.  Joseph Clay is a pleasant 73 y.o. male in NAD. AAO X 3.  Title    Diabetic Foot Exam - detailed Date & Time: 05/20/2023  9:30 AM Diabetic Foot exam was performed with the following findings: Yes  Visual Foot Exam completed.: Yes  Is there a history of foot ulcer?: No Is there a foot ulcer now?: No Is there swelling?: No Is there elevated skin temperature?: No Is there abnormal foot shape?: No Is there a claw toe deformity?: No Are the toenails long?: Yes Are the toenails thick?: Yes Are the toenails ingrown?: No Is the skin thin, fragile, shiny and hairless?: No Normal Range of Motion?: Yes Is there foot or ankle muscle weakness?: No Do you have pain in calf while walking?: No Are the shoes appropriate in style and fit?: Yes Can the  patient see the bottom of their feet?: No Pulse Foot Exam completed.: Yes   Right Posterior Tibialis: Present Left posterior Tibialis: Present   Right Dorsalis Pedis: Present Left Dorsalis Pedis: Present     Sensory Foot Exam Completed.: Yes Semmes-Weinstein Monofilament Test + means has sensation and - means no sensation  R Foot Test Control: Pos L Foot Test Control: Pos   R Site 1-Great Toe: Pos L Site 1-Great Toe: Pos   R Site 4: Pos L Site 4: Pos   R site 5: Pos L Site 5: Pos  R Site 6: Pos L Site 6: Pos     Image components are not supported.   Image components are not supported. Image components are not supported.  T[place] Right vibratory: present Left vibratory: absent  Comments Pt has subjective symptoms of neuropathy.  Evidence of skin changes consistent with long term venous stasis BLE. Evidence of chronic venous insufficiency b/l LE.       Lab Results  Component Value Date   HGBA1C 6.6 (H) 03/15/2018   ADA Risk Categorization: Low Risk :  Patient has all of the following: Intact protective sensation No prior foot ulcer  No severe deformity Pedal pulses present  Assessment: 1. Pain due to onychomycosis of toenails of both feet   2. Chronic venous insufficiency   3.  Diabetic peripheral neuropathy associated with type 2 diabetes mellitus (HCC)   4. Encounter for diabetic foot exam [facility])    Plan: -Patient was evaluated today. All questions/concerns addressed on today's visit. -Diabetic foot examination performed today. -Continue diabetic foot care principles: inspect feet daily, monitor glucose as recommended by PCP and/or Endocrinologist, and follow prescribed diet per PCP, Endocrinologist and/or dietician. -Patient to continue soft, supportive shoe gear daily. -Mycotic toenails 1-5 bilaterally were debrided in length and girth with sterile nail nippers and dremel. Pinpoint bleeding of right great toe addressed with Lumicain Hemostatic Solution, cleansed with alcohol. Band-aid applied which he may remove on tomorrow. No further treatment required by patient/caregiver. -Patient/POA to call should there be question/concern in the interim. Return in about 3 months (around 08/17/2023).  Delon LITTIE Merlin, DPM      [place] LOCATION: [address], [place] [address]                   Office [phone]   [facility] LOCATION: [address] [place], [place] [address] Office [phone]

## 2023-07-29 DIAGNOSIS — R0981 Nasal congestion: Secondary | ICD-10-CM | POA: Diagnosis not present

## 2023-07-29 DIAGNOSIS — J9801 Acute bronchospasm: Secondary | ICD-10-CM | POA: Diagnosis not present

## 2023-07-29 DIAGNOSIS — R051 Acute cough: Secondary | ICD-10-CM | POA: Diagnosis not present

## 2023-07-29 DIAGNOSIS — R062 Wheezing: Secondary | ICD-10-CM | POA: Diagnosis not present

## 2023-08-03 DIAGNOSIS — Z125 Encounter for screening for malignant neoplasm of prostate: Secondary | ICD-10-CM | POA: Diagnosis not present

## 2023-08-03 DIAGNOSIS — Z0000 Encounter for general adult medical examination without abnormal findings: Secondary | ICD-10-CM | POA: Diagnosis not present

## 2023-08-03 DIAGNOSIS — E1122 Type 2 diabetes mellitus with diabetic chronic kidney disease: Secondary | ICD-10-CM | POA: Diagnosis not present

## 2023-08-03 DIAGNOSIS — J019 Acute sinusitis, unspecified: Secondary | ICD-10-CM | POA: Diagnosis not present

## 2023-08-03 DIAGNOSIS — J449 Chronic obstructive pulmonary disease, unspecified: Secondary | ICD-10-CM | POA: Diagnosis not present

## 2023-08-03 DIAGNOSIS — E1169 Type 2 diabetes mellitus with other specified complication: Secondary | ICD-10-CM | POA: Diagnosis not present

## 2023-08-18 DIAGNOSIS — G4733 Obstructive sleep apnea (adult) (pediatric): Secondary | ICD-10-CM | POA: Diagnosis not present

## 2023-08-31 ENCOUNTER — Encounter: Payer: Self-pay | Admitting: Podiatry

## 2023-08-31 ENCOUNTER — Ambulatory Visit: Payer: Medicare Other | Admitting: Podiatry

## 2023-08-31 DIAGNOSIS — M79675 Pain in left toe(s): Secondary | ICD-10-CM | POA: Diagnosis not present

## 2023-08-31 DIAGNOSIS — E1142 Type 2 diabetes mellitus with diabetic polyneuropathy: Secondary | ICD-10-CM | POA: Diagnosis not present

## 2023-08-31 DIAGNOSIS — B351 Tinea unguium: Secondary | ICD-10-CM

## 2023-08-31 DIAGNOSIS — M79674 Pain in right toe(s): Secondary | ICD-10-CM | POA: Diagnosis not present

## 2023-08-31 NOTE — Progress Notes (Signed)
 This patient returns to my office for at risk foot care.  This patient requires this care by a professional since this patient will be at risk due to having type 2 diabetes.  This patient is unable to cut nails himself since the patient cannot reach his nails.These nails are painful walking and wearing shoes.  This patient presents for at risk foot care today.  General Appearance  Alert, conversant and in no acute stress.  Vascular  Dorsalis pedis and posterior tibial  pulses are palpable  bilaterally.  Capillary return is within normal limits  bilaterally. Temperature is within normal limits  bilaterally.  Neurologic  Senn-Weinstein monofilament wire test within normal limits  bilaterally. Muscle power within normal limits bilaterally.  Nails Thick disfigured discolored nails with subungual debris  from hallux to fifth toes bilaterally. No evidence of bacterial infection or drainage bilaterally.  Orthopedic  No limitations of motion  feet .  No crepitus or effusions noted.  No bony pathology or digital deformities noted.  Skin  normotropic skin with no porokeratosis noted bilaterally.  No signs of infections or ulcers noted.     Onychomycosis  Pain in right toes  Pain in left toes  Consent was obtained for treatment procedures.   Mechanical debridement of nails 1-5  bilaterally performed with a nail nipper.  Filed with dremel without incident.    Return office visit    3 months                 Told patient to return for periodic foot care and evaluation due to potential at risk complications.   Helane Gunther DPM

## 2023-09-16 DIAGNOSIS — N401 Enlarged prostate with lower urinary tract symptoms: Secondary | ICD-10-CM | POA: Diagnosis not present

## 2023-09-16 DIAGNOSIS — R3912 Poor urinary stream: Secondary | ICD-10-CM | POA: Diagnosis not present

## 2023-09-16 DIAGNOSIS — R310 Gross hematuria: Secondary | ICD-10-CM | POA: Diagnosis not present

## 2023-09-16 DIAGNOSIS — R972 Elevated prostate specific antigen [PSA]: Secondary | ICD-10-CM | POA: Diagnosis not present

## 2023-09-23 ENCOUNTER — Ambulatory Visit: Payer: Medicare Other | Admitting: Podiatry

## 2023-09-28 DIAGNOSIS — R310 Gross hematuria: Secondary | ICD-10-CM | POA: Diagnosis not present

## 2023-09-30 DIAGNOSIS — R310 Gross hematuria: Secondary | ICD-10-CM | POA: Diagnosis not present

## 2023-11-19 DIAGNOSIS — G4733 Obstructive sleep apnea (adult) (pediatric): Secondary | ICD-10-CM | POA: Diagnosis not present

## 2023-11-27 DIAGNOSIS — H52203 Unspecified astigmatism, bilateral: Secondary | ICD-10-CM | POA: Diagnosis not present

## 2023-11-30 ENCOUNTER — Encounter: Payer: Self-pay | Admitting: Podiatry

## 2023-11-30 ENCOUNTER — Ambulatory Visit: Admitting: Podiatry

## 2023-11-30 DIAGNOSIS — M79674 Pain in right toe(s): Secondary | ICD-10-CM

## 2023-11-30 DIAGNOSIS — M79675 Pain in left toe(s): Secondary | ICD-10-CM

## 2023-11-30 DIAGNOSIS — E1142 Type 2 diabetes mellitus with diabetic polyneuropathy: Secondary | ICD-10-CM

## 2023-11-30 DIAGNOSIS — B351 Tinea unguium: Secondary | ICD-10-CM

## 2023-11-30 DIAGNOSIS — L03032 Cellulitis of left toe: Secondary | ICD-10-CM

## 2023-11-30 DIAGNOSIS — E119 Type 2 diabetes mellitus without complications: Secondary | ICD-10-CM

## 2023-11-30 NOTE — Progress Notes (Signed)
 This patient returns to my office for at risk foot care.  This patient requires this care by a professional since this patient will be at risk due to having type 2 diabetes. This patient is unable to cut nails himself since the patient cannot reach his nails.These nails are painful walking and wearing shoes.  This patient presents for at risk foot care today.  General Appearance  Alert, conversant and in no acute stress.  Vascular  Dorsalis pedis and posterior tibial  pulses are palpable  bilaterally.  Capillary return is within normal limits  bilaterally. Temperature is within normal limits  bilaterally.  Neurologic  Senn-Weinstein monofilament wire test within normal limits  bilaterally. Muscle power within normal limits bilaterally.  Nails Thick disfigured discolored nails with subungual debris  from hallux to fifth toes bilaterally. No evidence of bacterial infection or drainage bilaterally. Subungual abscess left hallux.  Orthopedic  No limitations of motion  feet .  No crepitus or effusions noted.  No bony pathology or digital deformities noted.  Skin  normotropic skin with no porokeratosis noted bilaterally.  No signs of infections or ulcers noted.     Onychomycosis  Pain in right toes  Pain in left toes  Abscess left hallux.  Consent was obtained for treatment procedures.   Mechanical debridement of nails 1-5  bilaterally performed with a nail nipper.  Filed with dremel without incident.  Incision and drainage subungual left hallyx.  Neosporin/DSD.   Return office visit     3 months                 Told patient to return for periodic foot care and evaluation due to potential at risk complications.   Cordella Bold DPM

## 2024-01-11 DIAGNOSIS — E1122 Type 2 diabetes mellitus with diabetic chronic kidney disease: Secondary | ICD-10-CM | POA: Diagnosis not present

## 2024-01-11 DIAGNOSIS — E782 Mixed hyperlipidemia: Secondary | ICD-10-CM | POA: Diagnosis not present

## 2024-01-11 DIAGNOSIS — Z23 Encounter for immunization: Secondary | ICD-10-CM | POA: Diagnosis not present

## 2024-01-11 DIAGNOSIS — I10 Essential (primary) hypertension: Secondary | ICD-10-CM | POA: Diagnosis not present

## 2024-02-29 ENCOUNTER — Ambulatory Visit (INDEPENDENT_AMBULATORY_CARE_PROVIDER_SITE_OTHER): Admitting: Podiatry

## 2024-02-29 ENCOUNTER — Encounter: Payer: Self-pay | Admitting: Podiatry

## 2024-02-29 DIAGNOSIS — E1142 Type 2 diabetes mellitus with diabetic polyneuropathy: Secondary | ICD-10-CM

## 2024-02-29 DIAGNOSIS — M79675 Pain in left toe(s): Secondary | ICD-10-CM

## 2024-02-29 DIAGNOSIS — B351 Tinea unguium: Secondary | ICD-10-CM | POA: Diagnosis not present

## 2024-02-29 DIAGNOSIS — M79674 Pain in right toe(s): Secondary | ICD-10-CM

## 2024-02-29 NOTE — Progress Notes (Signed)
 This patient returns to my office for at risk foot care.  This patient requires this care by a professional since this patient will be at risk due to having type 2 diabetes. This patient is unable to cut nails himself since the patient cannot reach his nails.These nails are painful walking and wearing shoes.  This patient presents for at risk foot care today.  General Appearance  Alert, conversant and in no acute stress.  Vascular  Dorsalis pedis and posterior tibial  pulses are palpable  bilaterally.  Capillary return is within normal limits  bilaterally. Temperature is within normal limits  bilaterally.  Neurologic  Senn-Weinstein monofilament wire test within normal limits  bilaterally. Muscle power within normal limits bilaterally.  Nails Thick disfigured discolored nails with subungual debris  from hallux to fifth toes bilaterally. No evidence of bacterial infection or drainage bilaterally.  Orthopedic  No limitations of motion  feet .  No crepitus or effusions noted.  No bony pathology or digital deformities noted.  Skin  normotropic skin with no porokeratosis noted bilaterally.  No signs of infections or ulcers noted.     Onychomycosis  Pain in right toes  Pain in left toes  Abscess left hallux.  Consent was obtained for treatment procedures.   Mechanical debridement of nails 1-5  bilaterally performed with a nail nipper.  Filed with dremel without incident.     Return office visit     3 months                 Told patient to return for periodic foot care and evaluation due to potential at risk complications.   Cordella Bold DPM

## 2024-03-08 DIAGNOSIS — G4733 Obstructive sleep apnea (adult) (pediatric): Secondary | ICD-10-CM | POA: Diagnosis not present

## 2024-05-20 NOTE — Progress Notes (Signed)
 SCHYLAR WUEBKER                                          MRN: [id number]   05/20/2024   The [facility] Quality Team Specialist reviewed this patient medical record for the purposes of chart review for care gap closure. The following were reviewed: chart review for care gap closure-kidney health evaluation for diabetes:eGFR  and uACR.    [facility] Quality Team

## 2024-05-30 ENCOUNTER — Ambulatory Visit: Admitting: Podiatry

## 2024-06-02 ENCOUNTER — Ambulatory Visit: Admitting: Podiatry
